# Patient Record
Sex: Female | Born: 1935
Health system: Southern US, Community
[De-identification: ages and names within clinical notes are randomized; demographics above are authoritative.]

## PROBLEM LIST (undated history)

## (undated) DIAGNOSIS — G479 Sleep disorder, unspecified: Secondary | ICD-10-CM

## (undated) DIAGNOSIS — I10 Essential (primary) hypertension: Secondary | ICD-10-CM

## (undated) DIAGNOSIS — R269 Unspecified abnormalities of gait and mobility: Secondary | ICD-10-CM

## (undated) DIAGNOSIS — R202 Paresthesia of skin: Principal | ICD-10-CM

## (undated) DIAGNOSIS — M1990 Unspecified osteoarthritis, unspecified site: Secondary | ICD-10-CM

## (undated) DIAGNOSIS — E7800 Pure hypercholesterolemia, unspecified: Secondary | ICD-10-CM

## (undated) DIAGNOSIS — Z9889 Other specified postprocedural states: Secondary | ICD-10-CM

## (undated) DIAGNOSIS — Z4689 Encounter for fitting and adjustment of other specified devices: Secondary | ICD-10-CM

## (undated) DIAGNOSIS — J3489 Other specified disorders of nose and nasal sinuses: Secondary | ICD-10-CM

## (undated) HISTORY — DX: Pure hypercholesterolemia, unspecified: E78.00

## (undated) HISTORY — DX: Unspecified abnormalities of gait and mobility: R26.9

## (undated) HISTORY — PX: APPENDECTOMY: SHX54

## (undated) HISTORY — DX: Paresthesia of skin: R20.2

## (undated) HISTORY — PX: OTHER SURGICAL HISTORY: SHX169

---

## 1996-11-30 HISTORY — PX: JOINT REPLACEMENT: SHX530

## 2002-02-13 ENCOUNTER — Other Ambulatory Visit: Admission: RE | Admit: 2002-02-13 | Discharge: 2002-02-13 | Payer: Self-pay | Admitting: Obstetrics and Gynecology

## 2002-02-14 ENCOUNTER — Encounter: Payer: Self-pay | Admitting: Obstetrics and Gynecology

## 2002-02-14 ENCOUNTER — Ambulatory Visit (HOSPITAL_COMMUNITY): Admission: RE | Admit: 2002-02-14 | Discharge: 2002-02-14 | Payer: Self-pay | Admitting: Obstetrics and Gynecology

## 2005-04-16 ENCOUNTER — Other Ambulatory Visit: Admission: RE | Admit: 2005-04-16 | Discharge: 2005-04-16 | Payer: Self-pay | Admitting: Obstetrics and Gynecology

## 2007-08-23 ENCOUNTER — Ambulatory Visit (HOSPITAL_COMMUNITY): Admission: RE | Admit: 2007-08-23 | Discharge: 2007-08-23 | Payer: Self-pay | Admitting: Sports Medicine

## 2009-05-23 ENCOUNTER — Ambulatory Visit: Payer: Self-pay | Admitting: Orthopedic Surgery

## 2009-05-23 DIAGNOSIS — IMO0002 Reserved for concepts with insufficient information to code with codable children: Secondary | ICD-10-CM

## 2009-05-23 DIAGNOSIS — Z8679 Personal history of other diseases of the circulatory system: Secondary | ICD-10-CM | POA: Insufficient documentation

## 2009-05-23 DIAGNOSIS — M79609 Pain in unspecified limb: Secondary | ICD-10-CM

## 2009-05-23 DIAGNOSIS — M25569 Pain in unspecified knee: Secondary | ICD-10-CM

## 2009-05-23 DIAGNOSIS — M1710 Unilateral primary osteoarthritis, unspecified knee: Secondary | ICD-10-CM

## 2009-05-23 DIAGNOSIS — M7150 Other bursitis, not elsewhere classified, unspecified site: Secondary | ICD-10-CM

## 2012-02-03 DIAGNOSIS — M76899 Other specified enthesopathies of unspecified lower limb, excluding foot: Secondary | ICD-10-CM | POA: Diagnosis not present

## 2012-02-03 DIAGNOSIS — M169 Osteoarthritis of hip, unspecified: Secondary | ICD-10-CM | POA: Diagnosis not present

## 2012-03-02 ENCOUNTER — Encounter (HOSPITAL_COMMUNITY): Payer: Self-pay | Admitting: Pharmacy Technician

## 2012-03-09 ENCOUNTER — Ambulatory Visit (HOSPITAL_COMMUNITY)
Admission: RE | Admit: 2012-03-09 | Discharge: 2012-03-09 | Disposition: A | Discharge status: Home / Self Care | Payer: Medicare Other | Source: Ambulatory Visit | Attending: Orthopedic Surgery | Admitting: Orthopedic Surgery

## 2012-03-09 ENCOUNTER — Encounter (HOSPITAL_COMMUNITY)
Admission: RE | Admit: 2012-03-09 | Discharge: 2012-03-09 | Disposition: A | Discharge status: Home / Self Care | Payer: Medicare Other | Source: Ambulatory Visit | Attending: Orthopedic Surgery | Admitting: Orthopedic Surgery

## 2012-03-09 ENCOUNTER — Encounter (HOSPITAL_COMMUNITY): Payer: Self-pay

## 2012-03-09 ENCOUNTER — Other Ambulatory Visit (HOSPITAL_COMMUNITY): Payer: Self-pay

## 2012-03-09 DIAGNOSIS — I10 Essential (primary) hypertension: Secondary | ICD-10-CM | POA: Diagnosis not present

## 2012-03-09 DIAGNOSIS — Z01818 Encounter for other preprocedural examination: Secondary | ICD-10-CM | POA: Insufficient documentation

## 2012-03-09 DIAGNOSIS — Z01811 Encounter for preprocedural respiratory examination: Secondary | ICD-10-CM | POA: Diagnosis not present

## 2012-03-09 DIAGNOSIS — Z01812 Encounter for preprocedural laboratory examination: Secondary | ICD-10-CM | POA: Diagnosis not present

## 2012-03-09 DIAGNOSIS — M169 Osteoarthritis of hip, unspecified: Secondary | ICD-10-CM | POA: Diagnosis not present

## 2012-03-09 DIAGNOSIS — M1610 Unilateral primary osteoarthritis, unspecified hip: Secondary | ICD-10-CM | POA: Insufficient documentation

## 2012-03-09 DIAGNOSIS — Z01810 Encounter for preprocedural cardiovascular examination: Secondary | ICD-10-CM | POA: Diagnosis not present

## 2012-03-09 HISTORY — DX: Sleep disorder, unspecified: G47.9

## 2012-03-09 HISTORY — DX: Essential (primary) hypertension: I10

## 2012-03-09 HISTORY — DX: Other specified postprocedural states: Z98.890

## 2012-03-09 HISTORY — DX: Other specified disorders of nose and nasal sinuses: J34.89

## 2012-03-09 LAB — BASIC METABOLIC PANEL
BUN: 18 mg/dL (ref 6–23)
Calcium: 10 mg/dL (ref 8.4–10.5)
Creatinine, Ser: 1.12 mg/dL — ABNORMAL HIGH (ref 0.50–1.10)
GFR calc non Af Amer: 44 mL/min — ABNORMAL LOW (ref >90)
Glucose, Bld: 90 mg/dL (ref 70–99)

## 2012-03-09 LAB — CBC
Hemoglobin: 11.7 g/dL — ABNORMAL LOW (ref 12.0–15.0)
MCH: 30.8 pg (ref 26.0–34.0)
MCHC: 32.5 g/dL (ref 30.0–36.0)
Platelets: 202 10*3/uL (ref 150–400)

## 2012-03-09 LAB — DIFFERENTIAL
Basophils Relative: 1 % (ref 0–1)
Eosinophils Absolute: 0.1 10*3/uL (ref 0.0–0.7)
Monocytes Relative: 8 % (ref 3–12)
Neutrophils Relative %: 68 % (ref 43–77)

## 2012-03-09 LAB — SURGICAL PCR SCREEN: MRSA, PCR: NEGATIVE

## 2012-03-09 LAB — URINE MICROSCOPIC-ADD ON

## 2012-03-09 LAB — URINALYSIS, ROUTINE W REFLEX MICROSCOPIC
Bilirubin Urine: NEGATIVE
Ketones, ur: NEGATIVE mg/dL
Nitrite: NEGATIVE
pH: 6 (ref 5.0–8.0)

## 2012-03-09 LAB — PROTIME-INR: INR: 0.93 (ref 0.00–1.49)

## 2012-03-09 MED ORDER — CHLORHEXIDINE GLUCONATE 4 % EX LIQD
60.0000 mL | Freq: Once | CUTANEOUS | Status: DC
  Filled 2012-03-09: qty 60

## 2012-03-09 NOTE — Pre-Procedure Instructions (Signed)
Message sent to Dr. Charlann Boxer or PA to review abnormal ua and Bmet on Ms Michie (spoke with Candy at office)

## 2012-03-09 NOTE — H&P (Signed)
Carrie Sanders is an 78 y.o. female.    Chief Complaint: left hip OA and pain   HPI: Pt is a 78 y.o. female complaining of left hip pain for 2.5-3 years. Pain had continually increased since the beginning, especially in the last 6 months. X-rays in the clinic show end-stage arthritic changes of the left hip. Pt has tried various conservative treatments which have failed to alleviate their symptoms including NSAIDs. Various options are discussed with the patient. Risks, benefits and expectations were discussed with the patient. Patient understand the risks, benefits and expectations and wishes to proceed with surgery.   PCP:  No primary provider on file.  D/C Plans:  Home with HHPT  Post-op Meds:  Rx given for ASA, Robaxin, Iron, Colace and MiraLax  Tranexamic Acid:   To be given  Decadron:   To be given  PMH: Past Medical History  Diagnosis Date  . Hypertension   . Sinus drainage   . Constipation   . Sleeping difficulty   . H/O left breast biopsy     PSH: Past Surgical History  Procedure Date  . Joint replacement 1998    RT TOTAL HIP  . Cataracts removed   . Appendectomy     AGE 78    Social History:  reports that she quit smoking about 53 years ago. She does not have any smokeless tobacco history on file. She reports that she does not drink alcohol or use illicit drugs.  Allergies:  Allergies  Allergen Reactions  . Codeine Other (See Comments)    Pass out   . Sulfonamide Derivatives Rash    Medications: No current facility-administered medications for this encounter.   Current Outpatient Prescriptions  Medication Sig Dispense Refill  . bumetanide (BUMEX) 1 MG tablet Take 1 mg by mouth daily after breakfast.      . Calcium Carbonate-Vitamin D (CALCIUM + D PO) Take 1 tablet by mouth daily.      Marland Kitchen ibuprofen (ADVIL,MOTRIN) 200 MG tablet Take 400 mg by mouth every 6 (six) hours as needed. Pain      . losartan (COZAAR) 100 MG tablet Take 50 mg by mouth 2  (two) times daily.      . naproxen sodium (ANAPROX) 220 MG tablet Take 220 mg by mouth 2 (two) times daily as needed. Pain      . rosuvastatin (CRESTOR) 20 MG tablet Take 20 mg by mouth daily after breakfast.      . traMADol (ULTRAM) 50 MG tablet Take 50 mg by mouth every 6 (six) hours as needed. Pain         Facility-Administered Medications Ordered in Other Encounters  Medication Dose Route Frequency Provider Last Rate Last Dose  . chlorhexidine (HIBICLENS) 4 % liquid 4 application  60 mL Topical Once Genelle Gather [place], [place]        Results for orders placed during the hospital encounter of 03/09/12 (from the past 48 hour(s))  SURGICAL PCR SCREEN     Status: Abnormal   Collection Time   03/09/12 11:06 AM      Component Value Range Comment   MRSA, PCR NEGATIVE  NEGATIVE     Staphylococcus aureus POSITIVE (*) NEGATIVE    URINALYSIS, ROUTINE W REFLEX MICROSCOPIC     Status: Abnormal   Collection Time   03/09/12 11:07 AM      Component Value Range Comment   Color, Urine YELLOW  YELLOW     APPearance CLEAR  CLEAR  Specific Gravity, Urine 1.019  1.005 - 1.030     pH 6.0  5.0 - 8.0     Glucose, UA NEGATIVE  NEGATIVE (mg/dL)    Hgb urine dipstick SMALL (*) NEGATIVE     Bilirubin Urine NEGATIVE  NEGATIVE     Ketones, ur NEGATIVE  NEGATIVE (mg/dL)    Protein, ur NEGATIVE  NEGATIVE (mg/dL)    Urobilinogen, UA 0.2  0.0 - 1.0 (mg/dL)    Nitrite NEGATIVE  NEGATIVE     Leukocytes, UA MODERATE (*) NEGATIVE    URINE MICROSCOPIC-ADD ON     Status: Abnormal   Collection Time   03/09/12 11:07 AM      Component Value Range Comment   Squamous Epithelial / LPF FEW (*) RARE     WBC, UA 7-10  <3 (WBC/hpf)    RBC / HPF 3-6  <3 (RBC/hpf)    Bacteria, UA RARE  RARE     Casts HYALINE CASTS (*) NEGATIVE    APTT     Status: Normal   Collection Time   03/09/12 11:15 AM      Component Value Range Comment   aPTT 28  24 - 37 (seconds)   BASIC METABOLIC PANEL     Status: Abnormal   Collection Time    03/09/12 11:15 AM      Component Value Range Comment   Sodium 140  135 - 145 (mEq/L)    Potassium 4.1  3.5 - 5.1 (mEq/L)    Chloride 103  96 - 112 (mEq/L)    CO2 29  19 - 32 (mEq/L)    Glucose, Bld 90  70 - 99 (mg/dL)    BUN 18  6 - 23 (mg/dL)    Creatinine, Ser 1.61 (*) 0.50 - 1.10 (mg/dL)    Calcium 09.6  8.4 - 10.5 (mg/dL)    GFR calc non Af Amer 44 (*) >90 (mL/min)    GFR calc Af Amer 52 (*) >90 (mL/min)   CBC     Status: Abnormal   Collection Time   03/09/12 11:15 AM      Component Value Range Comment   WBC 5.0  4.0 - 10.5 (K/uL)    RBC 3.80 (*) 3.87 - 5.11 (MIL/uL)    Hemoglobin 11.7 (*) 12.0 - 15.0 (g/dL)    HCT 04.5  40.9 - 81.1 (%)    MCV 94.7  78.0 - 100.0 (fL)    MCH 30.8  26.0 - 34.0 (pg)    MCHC 32.5  30.0 - 36.0 (g/dL)    RDW 91.4  78.2 - 95.6 (%)    Platelets 202  150 - 400 (K/uL)   DIFFERENTIAL     Status: Normal   Collection Time   03/09/12 11:15 AM      Component Value Range Comment   Neutrophils Relative 68  43 - 77 (%)    Neutro Abs 3.4  1.7 - 7.7 (K/uL)    Lymphocytes Relative 22  12 - 46 (%)    Lymphs Abs 1.1  0.7 - 4.0 (K/uL)    Monocytes Relative 8  3 - 12 (%)    Monocytes Absolute 0.4  0.1 - 1.0 (K/uL)    Eosinophils Relative 2  0 - 5 (%)    Eosinophils Absolute 0.1  0.0 - 0.7 (K/uL)    Basophils Relative 1  0 - 1 (%)    Basophils Absolute 0.0  0.0 - 0.1 (K/uL)   PROTIME-INR     Status: Normal  Collection Time   03/09/12 11:15 AM      Component Value Range Comment   Prothrombin Time 12.7  11.6 - 15.2 (seconds)    INR 0.93  0.00 - 1.49     Dg Chest 2 View  03/09/2012  *RADIOLOGY REPORT*  Clinical Data: Preop  CHEST - 2 VIEW  Comparison: None.  Findings: Cardiomediastinal silhouette is unremarkable.  Mild hyperinflation.  No acute infiltrate or pleural effusion.  Mild degenerative changes thoracic spine. Atherosclerotic calcifications are noted thoracic and abdominal aorta.  IMPRESSION: No active disease.  Mild hyperinflation.  Original Report  Authenticated By: Natasha Mead, M.D.    ROS: Review of Systems  Constitutional: Negative.   HENT: Negative.   Eyes: Negative.   Respiratory: Negative.   Cardiovascular: Negative.   Gastrointestinal: Positive for constipation.  Genitourinary: Negative.   Musculoskeletal: Positive for back pain and joint pain.  Skin: Negative.   Neurological: Negative.   Endo/Heme/Allergies: Negative.   Psychiatric/Behavioral: Negative.      Physical Exam: BP: 142/77 ; Pulse: 100 ; Resp: 16 ; Physical Exam  Constitutional: She is well-developed, well-nourished, and in no distress.  HENT:  Head: Normocephalic and atraumatic.  Nose: Nose normal.  Mouth/Throat: Oropharynx is clear and moist.  Eyes: Pupils are equal, round, and reactive to light.  Neck: Neck supple. No JVD present. Decreased range of motion present. No tracheal deviation present. No thyromegaly present.  Cardiovascular: Normal rate, regular rhythm, normal heart sounds and intact distal pulses.  Exam reveals no gallop and no friction rub.   No murmur heard. Pulmonary/Chest: Effort normal and breath sounds normal. No stridor. No respiratory distress.  Abdominal: Soft. There is no tenderness. There is no guarding.  Musculoskeletal:       Left hip: She exhibits decreased range of motion, decreased strength, tenderness and bony tenderness. She exhibits no swelling, no crepitus, no deformity and no laceration.     Assessment/Plan Assessment: left hip OA and pain   Plan: Patient will undergo a left total hip arthroplasty, anterior approach on 03/17/2012 at [facility] per Dr. Charlann Boxer. Risks benefits and expectation were discussed with the patient. Patient understand risks, benefits and expectation and wishes to proceed.   Anastasio Auerbach Josie Burleigh   PAC  03/09/2012, 3:05 PM

## 2012-03-09 NOTE — Patient Instructions (Signed)
22 Carrie Sanders  03/09/2012   Your procedure is scheduled on:  03/17/12  Report to SHORT STAY DEPT  at 5:15 AM.  Call this number if you have problems the morning of surgery: [phone]   Remember:   Do not eat food or drink liquids AFTER MIDNIGHT  .  Take these medicines the morning of surgery with A SIP OF WATER: CRESTOR / ULTRAM IF NEEDED   Do not wear jewelry, make-up or nail polish.  Do not wear lotions, powders, or perfumes.   Do not shave legs or underarms 48 hrs. before surgery (men may shave face)  Do not bring valuables to the hospital.  Contacts, dentures or bridgework may not be worn into surgery.  Leave suitcase in the car. After surgery it may be brought to your room.  For patients admitted to the hospital, checkout time is 11:00 AM the day of discharge.   Patients discharged the day of surgery will not be allowed to drive home.  Name and phone number of your driver:   Special Instructions:   Please read over the following fact sheets that you were given: MRSA  Information /INCENTIVE  SPIROMETER               SHOWER WITH BETASEPT THE NIGHT BEFORE SURGERY AND THE MORNING OF SURGERY

## 2012-03-17 ENCOUNTER — Encounter (HOSPITAL_COMMUNITY): Payer: Self-pay | Admitting: Anesthesiology

## 2012-03-17 ENCOUNTER — Ambulatory Visit (HOSPITAL_COMMUNITY): Payer: Medicare Other

## 2012-03-17 ENCOUNTER — Ambulatory Visit (HOSPITAL_COMMUNITY): Payer: Medicare Other | Admitting: Anesthesiology

## 2012-03-17 ENCOUNTER — Encounter (HOSPITAL_COMMUNITY)
Admission: RE | Disposition: A | Discharge status: Home Health | Payer: Self-pay | Source: Ambulatory Visit | Attending: Orthopedic Surgery

## 2012-03-17 ENCOUNTER — Inpatient Hospital Stay (HOSPITAL_COMMUNITY)
Admission: RE | Admit: 2012-03-17 | Discharge: 2012-03-19 | DRG: 470 | Disposition: A | Discharge status: Home Health | Payer: Medicare Other | Source: Ambulatory Visit | Attending: Orthopedic Surgery | Admitting: Orthopedic Surgery

## 2012-03-17 ENCOUNTER — Encounter (HOSPITAL_COMMUNITY): Payer: Self-pay | Admitting: *Deleted

## 2012-03-17 DIAGNOSIS — I10 Essential (primary) hypertension: Secondary | ICD-10-CM | POA: Diagnosis not present

## 2012-03-17 DIAGNOSIS — D649 Anemia, unspecified: Secondary | ICD-10-CM | POA: Diagnosis not present

## 2012-03-17 DIAGNOSIS — Z471 Aftercare following joint replacement surgery: Secondary | ICD-10-CM | POA: Diagnosis not present

## 2012-03-17 DIAGNOSIS — M169 Osteoarthritis of hip, unspecified: Principal | ICD-10-CM | POA: Diagnosis present

## 2012-03-17 DIAGNOSIS — M1610 Unilateral primary osteoarthritis, unspecified hip: Secondary | ICD-10-CM | POA: Diagnosis not present

## 2012-03-17 DIAGNOSIS — Z96649 Presence of unspecified artificial hip joint: Secondary | ICD-10-CM

## 2012-03-17 DIAGNOSIS — M25559 Pain in unspecified hip: Secondary | ICD-10-CM | POA: Diagnosis not present

## 2012-03-17 HISTORY — PX: TOTAL HIP ARTHROPLASTY: SHX124

## 2012-03-17 LAB — ABO/RH: ABO/RH(D): A POS

## 2012-03-17 LAB — TYPE AND SCREEN: Antibody Screen: NEGATIVE

## 2012-03-17 SURGERY — ARTHROPLASTY, HIP, TOTAL, ANTERIOR APPROACH
Anesthesia: Spinal | Site: Hip | Laterality: Left | Wound class: Clean

## 2012-03-17 MED ORDER — DIPHENHYDRAMINE HCL 25 MG PO CAPS: 25.0000 mg | ORAL_CAPSULE | Freq: Four times a day (QID) | ORAL | Status: DC | PRN

## 2012-03-17 MED ORDER — FLEET ENEMA 7-19 GM/118ML RE ENEM: 1.0000 | ENEMA | Freq: Once | RECTAL | Status: AC | PRN

## 2012-03-17 MED ORDER — LOSARTAN POTASSIUM 50 MG PO TABS
50.0000 mg | ORAL_TABLET | Freq: Two times a day (BID) | ORAL | Status: DC
  Administered 2012-03-17 – 2012-03-19 (×4): 50 mg via ORAL
  Filled 2012-03-17 (×6): qty 1

## 2012-03-17 MED ORDER — SODIUM CHLORIDE 0.9 % IV SOLN
100.0000 mL/h | INTRAVENOUS | Status: DC
  Administered 2012-03-17 – 2012-03-18 (×3): 100 mL/h via INTRAVENOUS
  Filled 2012-03-17 (×12): qty 1000

## 2012-03-17 MED ORDER — 0.9 % SODIUM CHLORIDE (POUR BTL) OPTIME
TOPICAL | Status: DC | PRN
  Administered 2012-03-17: 1000 mL

## 2012-03-17 MED ORDER — PHENOL 1.4 % MT LIQD: 1.0000 | OROMUCOSAL | Status: DC | PRN

## 2012-03-17 MED ORDER — ONDANSETRON HCL 4 MG PO TABS: 4.0000 mg | ORAL_TABLET | Freq: Four times a day (QID) | ORAL | Status: DC | PRN

## 2012-03-17 MED ORDER — PROPOFOL 10 MG/ML IV EMUL
INTRAVENOUS | Status: DC | PRN
  Administered 2012-03-17: 140 ug/kg/min via INTRAVENOUS

## 2012-03-17 MED ORDER — METHOCARBAMOL 100 MG/ML IJ SOLN
500.0000 mg | Freq: Four times a day (QID) | INTRAVENOUS | Status: DC | PRN
  Administered 2012-03-17: 500 mg via INTRAVENOUS
  Filled 2012-03-17: qty 5

## 2012-03-17 MED ORDER — EPHEDRINE SULFATE 50 MG/ML IJ SOLN
INTRAMUSCULAR | Status: DC | PRN
  Administered 2012-03-17: 10 mg via INTRAVENOUS

## 2012-03-17 MED ORDER — ZOLPIDEM TARTRATE 5 MG PO TABS: 5.0000 mg | ORAL_TABLET | Freq: Every evening | ORAL | Status: DC | PRN

## 2012-03-17 MED ORDER — ACETAMINOPHEN 10 MG/ML IV SOLN
INTRAVENOUS | Status: AC
  Filled 2012-03-17: qty 100

## 2012-03-17 MED ORDER — BUMETANIDE 1 MG PO TABS
1.0000 mg | ORAL_TABLET | Freq: Every day | ORAL | Status: DC
  Administered 2012-03-18: 1 mg via ORAL
  Filled 2012-03-17 (×2): qty 1

## 2012-03-17 MED ORDER — ONDANSETRON HCL 4 MG/2ML IJ SOLN
INTRAMUSCULAR | Status: DC | PRN
  Administered 2012-03-17: 4 mg via INTRAVENOUS

## 2012-03-17 MED ORDER — ACETAMINOPHEN 650 MG RE SUPP: 650.0000 mg | Freq: Four times a day (QID) | RECTAL | Status: DC | PRN

## 2012-03-17 MED ORDER — CEFAZOLIN SODIUM 1-5 GM-% IV SOLN
1.0000 g | Freq: Four times a day (QID) | INTRAVENOUS | Status: AC
  Administered 2012-03-17 – 2012-03-18 (×3): 1 g via INTRAVENOUS
  Filled 2012-03-17 (×3): qty 50

## 2012-03-17 MED ORDER — TRANEXAMIC ACID 100 MG/ML IV SOLN
15.0000 mg/kg | Freq: Once | INTRAVENOUS | Status: AC
  Administered 2012-03-17: 843 mg via INTRAVENOUS
  Filled 2012-03-17: qty 8.43

## 2012-03-17 MED ORDER — METOCLOPRAMIDE HCL 10 MG PO TABS: 5.0000 mg | ORAL_TABLET | Freq: Three times a day (TID) | ORAL | Status: DC | PRN

## 2012-03-17 MED ORDER — DEXAMETHASONE SODIUM PHOSPHATE 10 MG/ML IJ SOLN
10.0000 mg | Freq: Once | INTRAMUSCULAR | Status: AC
  Administered 2012-03-17: 6 mg via INTRAVENOUS

## 2012-03-17 MED ORDER — HYDROCODONE-ACETAMINOPHEN 5-325 MG PO TABS
1.0000 | ORAL_TABLET | ORAL | Status: DC | PRN
  Administered 2012-03-17 (×3): 2 via ORAL
  Administered 2012-03-18 (×3): 1 via ORAL
  Administered 2012-03-18: 2 via ORAL
  Administered 2012-03-19: 1 via ORAL
  Filled 2012-03-17: qty 2
  Filled 2012-03-17: qty 1
  Filled 2012-03-17 (×2): qty 2
  Filled 2012-03-17: qty 1
  Filled 2012-03-17: qty 2
  Filled 2012-03-17 (×2): qty 1

## 2012-03-17 MED ORDER — ACETAMINOPHEN 10 MG/ML IV SOLN
INTRAVENOUS | Status: DC | PRN
  Administered 2012-03-17: 1000 mg via INTRAVENOUS

## 2012-03-17 MED ORDER — MENTHOL 3 MG MT LOZG: 1.0000 | LOZENGE | OROMUCOSAL | Status: DC | PRN

## 2012-03-17 MED ORDER — METHOCARBAMOL 500 MG PO TABS
500.0000 mg | ORAL_TABLET | Freq: Four times a day (QID) | ORAL | Status: DC | PRN
  Administered 2012-03-17 – 2012-03-18 (×2): 500 mg via ORAL
  Filled 2012-03-17 (×2): qty 1

## 2012-03-17 MED ORDER — POLYETHYLENE GLYCOL 3350 17 G PO PACK
17.0000 g | PACK | Freq: Two times a day (BID) | ORAL | Status: DC
  Administered 2012-03-17 – 2012-03-18 (×2): 17 g via ORAL
  Filled 2012-03-17 (×6): qty 1

## 2012-03-17 MED ORDER — ACETAMINOPHEN 325 MG PO TABS: 650.0000 mg | ORAL_TABLET | Freq: Four times a day (QID) | ORAL | Status: DC | PRN

## 2012-03-17 MED ORDER — METOCLOPRAMIDE HCL 5 MG/ML IJ SOLN: 5.0000 mg | Freq: Three times a day (TID) | INTRAMUSCULAR | Status: DC | PRN

## 2012-03-17 MED ORDER — ONDANSETRON HCL 4 MG/2ML IJ SOLN: 4.0000 mg | Freq: Four times a day (QID) | INTRAMUSCULAR | Status: DC | PRN

## 2012-03-17 MED ORDER — ALUM & MAG HYDROXIDE-SIMETH 200-200-20 MG/5ML PO SUSP: 30.0000 mL | ORAL | Status: DC | PRN

## 2012-03-17 MED ORDER — BISACODYL 5 MG PO TBEC: 5.0000 mg | DELAYED_RELEASE_TABLET | Freq: Every day | ORAL | Status: DC | PRN

## 2012-03-17 MED ORDER — LACTATED RINGERS IV SOLN: INTRAVENOUS | Status: DC

## 2012-03-17 MED ORDER — DEXAMETHASONE SODIUM PHOSPHATE 10 MG/ML IJ SOLN
10.0000 mg | Freq: Once | INTRAMUSCULAR | Status: AC
  Administered 2012-03-18: 10 mg via INTRAVENOUS
  Filled 2012-03-17: qty 1

## 2012-03-17 MED ORDER — CEFAZOLIN SODIUM 1-5 GM-% IV SOLN
1.0000 g | INTRAVENOUS | Status: AC
  Administered 2012-03-17: 1 g via INTRAVENOUS

## 2012-03-17 MED ORDER — ATORVASTATIN CALCIUM 40 MG PO TABS
40.0000 mg | ORAL_TABLET | Freq: Every day | ORAL | Status: DC
  Administered 2012-03-18: 40 mg via ORAL
  Filled 2012-03-17 (×2): qty 1

## 2012-03-17 MED ORDER — RIVAROXABAN 10 MG PO TABS
10.0000 mg | ORAL_TABLET | ORAL | Status: DC
  Administered 2012-03-18 – 2012-03-19 (×2): 10 mg via ORAL
  Filled 2012-03-17 (×2): qty 1

## 2012-03-17 MED ORDER — LACTATED RINGERS IV SOLN
INTRAVENOUS | Status: DC | PRN
  Administered 2012-03-17 (×2): via INTRAVENOUS

## 2012-03-17 MED ORDER — HYDROMORPHONE HCL PF 1 MG/ML IJ SOLN: 0.2500 mg | INTRAMUSCULAR | Status: DC | PRN

## 2012-03-17 MED ORDER — MIDAZOLAM HCL 5 MG/5ML IJ SOLN
INTRAMUSCULAR | Status: DC | PRN
  Administered 2012-03-17: 1 mg via INTRAVENOUS

## 2012-03-17 MED ORDER — FERROUS SULFATE 325 (65 FE) MG PO TABS
325.0000 mg | ORAL_TABLET | Freq: Three times a day (TID) | ORAL | Status: DC
  Administered 2012-03-17 – 2012-03-19 (×5): 325 mg via ORAL
  Filled 2012-03-17 (×7): qty 1

## 2012-03-17 MED ORDER — HYDROMORPHONE HCL PF 1 MG/ML IJ SOLN: 0.5000 mg | INTRAMUSCULAR | Status: DC | PRN

## 2012-03-17 MED ORDER — CEFAZOLIN SODIUM 1-5 GM-% IV SOLN
INTRAVENOUS | Status: AC
  Filled 2012-03-17: qty 50

## 2012-03-17 MED ORDER — BUPIVACAINE IN DEXTROSE 0.75-8.25 % IT SOLN
INTRATHECAL | Status: DC | PRN
  Administered 2012-03-17: 10 mg via INTRATHECAL

## 2012-03-17 MED ORDER — PROPOFOL 10 MG/ML IV BOLUS
INTRAVENOUS | Status: DC | PRN
  Administered 2012-03-17: 10 mg via INTRAVENOUS
  Administered 2012-03-17: 30 mg via INTRAVENOUS

## 2012-03-17 MED ORDER — FENTANYL CITRATE 0.05 MG/ML IJ SOLN
INTRAMUSCULAR | Status: DC | PRN
  Administered 2012-03-17 (×4): 25 ug via INTRAVENOUS

## 2012-03-17 MED ORDER — DOCUSATE SODIUM 100 MG PO CAPS
100.0000 mg | ORAL_CAPSULE | Freq: Two times a day (BID) | ORAL | Status: DC
  Administered 2012-03-17 – 2012-03-19 (×5): 100 mg via ORAL
  Filled 2012-03-17 (×6): qty 1

## 2012-03-17 SURGICAL SUPPLY — 43 items
ADH SKN CLS APL DERMABOND .7 (GAUZE/BANDAGES/DRESSINGS) ×1
BAG SPEC THK2 15X12 ZIP CLS (MISCELLANEOUS) ×2
BAG ZIPLOCK 12X15 (MISCELLANEOUS) ×4 IMPLANT
BLADE SAW SGTL 18X1.27X75 (BLADE) ×2 IMPLANT
CELLS DAT CNTRL 66122 CELL SVR (MISCELLANEOUS) ×1 IMPLANT
CLOTH BEACON ORANGE TIMEOUT ST (SAFETY) ×2 IMPLANT
DERMABOND ADVANCED (GAUZE/BANDAGES/DRESSINGS) ×1
DERMABOND ADVANCED .7 DNX12 (GAUZE/BANDAGES/DRESSINGS) ×1 IMPLANT
DRAPE C-ARM 42X72 X-RAY (DRAPES) ×2 IMPLANT
DRAPE STERI IOBAN 125X83 (DRAPES) ×2 IMPLANT
DRAPE U-SHAPE 47X51 STRL (DRAPES) ×6 IMPLANT
DRSG AQUACEL AG ADV 3.5X10 (GAUZE/BANDAGES/DRESSINGS) ×2 IMPLANT
DRSG TEGADERM 2-3/8X2-3/4 SM (GAUZE/BANDAGES/DRESSINGS) ×1 IMPLANT
DRSG TEGADERM 4X4.75 (GAUZE/BANDAGES/DRESSINGS) IMPLANT
DURAPREP 26ML APPLICATOR (WOUND CARE) ×2 IMPLANT
ELECT BLADE TIP CTD 4 INCH (ELECTRODE) ×2 IMPLANT
ELECT REM PT RETURN 9FT ADLT (ELECTROSURGICAL) ×2
ELECTRODE REM PT RTRN 9FT ADLT (ELECTROSURGICAL) ×1 IMPLANT
EVACUATOR 1/8 PVC DRAIN (DRAIN) ×1 IMPLANT
FACESHIELD LNG OPTICON STERILE (SAFETY) ×8 IMPLANT
GAUZE SPONGE 2X2 8PLY STRL LF (GAUZE/BANDAGES/DRESSINGS) ×1 IMPLANT
GLOVE BIOGEL PI IND STRL 7.5 (GLOVE) ×1 IMPLANT
GLOVE BIOGEL PI IND STRL 8 (GLOVE) ×1 IMPLANT
GLOVE BIOGEL PI INDICATOR 7.5 (GLOVE) ×1
GLOVE BIOGEL PI INDICATOR 8 (GLOVE) ×1
GLOVE ECLIPSE 8.0 STRL XLNG CF (GLOVE) ×2 IMPLANT
GLOVE ORTHO TXT STRL SZ7.5 (GLOVE) ×4 IMPLANT
GOWN BRE IMP PREV XXLGXLNG (GOWN DISPOSABLE) ×4 IMPLANT
GOWN STRL NON-REIN LRG LVL3 (GOWN DISPOSABLE) ×3 IMPLANT
KIT BASIN OR (CUSTOM PROCEDURE TRAY) ×2 IMPLANT
PACK TOTAL JOINT (CUSTOM PROCEDURE TRAY) ×2 IMPLANT
PADDING CAST COTTON 6X4 STRL (CAST SUPPLIES) ×2 IMPLANT
RETRACTOR WND ALEXIS 18 MED (MISCELLANEOUS) ×1 IMPLANT
RTRCTR WOUND ALEXIS 18CM MED (MISCELLANEOUS) ×2
SPONGE GAUZE 2X2 STER 10/PKG (GAUZE/BANDAGES/DRESSINGS) ×1
SUCTION FRAZIER 12FR DISP (SUCTIONS) ×2 IMPLANT
SUT MNCRL AB 4-0 PS2 18 (SUTURE) ×2 IMPLANT
SUT VIC AB 1 CT1 36 (SUTURE) ×8 IMPLANT
SUT VIC AB 2-0 CT1 27 (SUTURE) ×4
SUT VIC AB 2-0 CT1 TAPERPNT 27 (SUTURE) ×2 IMPLANT
SUT VLOC 180 0 24IN GS25 (SUTURE) ×1 IMPLANT
TOWEL OR 17X26 10 PK STRL BLUE (TOWEL DISPOSABLE) ×4 IMPLANT
TRAY FOLEY CATH 14FRSI W/METER (CATHETERS) ×2 IMPLANT

## 2012-03-17 NOTE — Preoperative (Signed)
Beta Blockers   Reason not to administer Beta Blockers:Not Applicable 

## 2012-03-17 NOTE — Anesthesia Postprocedure Evaluation (Signed)
  Anesthesia Post-op Note  Patient: Carrie Sanders  Procedure(s) Performed: Procedure(s) (LRB): TOTAL HIP ARTHROPLASTY ANTERIOR APPROACH (Left)  Patient Location: PACU  Anesthesia Type: Spinal  Level of Consciousness: oriented and sedated  Airway and Oxygen Therapy: Patient Spontanous Breathing and Patient connected to nasal cannula oxygen  Post-op Pain: mild  Post-op Assessment: Post-op Vital signs reviewed, Patient's Cardiovascular Status Stable, Respiratory Function Stable and Patent Airway  Post-op Vital Signs: stable  Complications: No apparent anesthesia complications

## 2012-03-17 NOTE — Op Note (Signed)
NAME:  Kaydin Labo                ACCOUNT NO.: [id number]      MEDICAL RECORD NO.: [id number]      FACILITY:  [facility]      PHYSICIAN:  Durene Romans D  DATE OF BIRTH:  27-Mar-1927     DATE OF PROCEDURE:  03/17/2012                                 OPERATIVE REPORT         PREOPERATIVE DIAGNOSIS: Left  hip osteoarthritis.      POSTOPERATIVE DIAGNOSIS:  Left osteoarthritis.      PROCEDURE:  Left total hip replacement through an anterior approach   utilizing DePuy THR system, component size 48mm pinnacle cup, a size 32+4 neutral   Altrex liner, a size 3Hi Tri Lock stem with a 32+1 aSphere metal ball.      SURGEON:  Madlyn Frankel. Charlann Boxer, M.D.      ASSISTANT:  Lanney Gins, PA      ANESTHESIA:  Spinal.      SPECIMENS:  None.      COMPLICATIONS:  None.      BLOOD LOSS:  150 cc     DRAINS:  One Hemovac.      INDICATION OF THE PROCEDURE:  Willena Jeancharles is a 78 y.o. female who had   presented to office for evaluation of left hip pain.  Radiographs revealed   progressive degenerative changes with bone-on-bone   articulation to the  hip joint.  The patient had painful limited range of   motion significantly affecting their overall quality of life.  The patient was failing to    respond to conservative measures, and at this point was ready   to proceed with more definitive measures.  The patient has noted progressive   degenerative changes in his hip, progressive problems and dysfunction   with regarding the hip prior to surgery.  Consent was obtained for   benefit of pain relief.  Specific risk of infection, DVT, component   failure, dislocation, need for revision surgery, as well discussion of   the anterior versus posterior approach were reviewed.  Consent was   obtained for benefit of anterior pain relief through an anterior   approach.      PROCEDURE IN DETAIL:  The patient was brought to operative theater.   Once adequate anesthesia, preoperative antibiotics,  1gm Ancef administered.   The patient was positioned supine on the OSI Hanna table.  Once adequate   padding of boney process was carried out, we had predraped out the hip, and  used fluoroscopy to confirm orientation of the pelvis and position.      The left hip was then prepped and draped from proximal iliac crest to   mid thigh with shower curtain technique.      Time-out was performed identifying the patient, planned procedure, and   extremity.     An incision was then made 2 cm distal and lateral to the   anterior superior iliac spine extending over the orientation of the   tensor fascia lata muscle and sharp dissection was carried down to the   fascia of the muscle and protractor placed in the soft tissues.      The fascia was then incised.  The muscle belly was identified and swept   laterally and retractor placed  along the superior neck.  Following   cauterization of the circumflex vessels and removing some pericapsular   fat, a second cobra retractor was placed on the inferior neck.  A third   retractor was placed on the anterior acetabulum after elevating the   anterior rectus.  A L-capsulotomy was along the line of the   superior neck to the trochanteric fossa, then extended proximally and   distally.  Tag sutures were placed and the retractors were then placed   intracapsular.  We then identified the trochanteric fossa and   orientation of my neck cut, confirmed this radiographically   and then made a neck osteotomy with the femur on traction.  The femoral   head was removed without difficulty or complication.  Traction was let   off and retractors were placed posterior and anterior around the   acetabulum.      The labrum and foveal tissue were debrided.  I began reaming with a 43mm   reamer and reamed up to 47mm reamer with good bony bed preparation and a 48mm   cup was chosen.  The final 48mm Pinnacle cup was then impacted under fluoroscopy  to confirm the depth of  penetration and orientation with respect to   abduction.  A screw was placed followed by the hole eliminator.  The final   32+4 Altrex liner was impacted with good visualized rim fit.  The cup was positioned anatomically within the acetabular portion of the pelvis.      At this point, the femur was rolled at 80 degrees.  Further capsule was   released off the inferior aspect of the femoral neck.  I then   released the superior capsule proximally.  The hook was placed laterally   along the femur and elevated manually and held in position with the bed   hook.  The leg was then extended and adducted with the leg rolled to 100   degrees of external rotation.  Once the proximal femur was fully   exposed, I used a box osteotome to set orientation.  I then began   broaching with the starting chili pepper broach and passed this by hand and then broached up to 3.  With the 3 broach in place I chose a high offset neck, a 32+1 ball and did a trial reduction.  The offset was appropriate, leg lengths   appeared to be equal, confirmed radiographically. She had a preoperative leg length discrepancy with history of previous right THR  Given these findings, I went ahead and dislocated the hip, repositioned all   retractors and positioned the right hip in the extended and abducted position.  The final 3 Hi  Tri Lock stem was   chosen and it was impacted down to the level of neck cut.  Based on this   and the trial reduction, a 32+1 aSphere metal ball was chosen and   impacted onto a clean and dry trunnion, and the hip was reduced.  The   hip had been irrigated throughout the case again at this point.  I did   reapproximate the superior capsular leaflet to the anterior leaflet   using #1 Vicryl, placed a medium Hemovac drain deep.  The fascia of the   tensor fascia lata muscle was then reapproximated using #1 Vicryl.  The   remaining wound was closed with 2-0 Vicryl and running 4-0 Monocryl.   The hip was  cleaned, dried, and dressed sterilely using Dermabond and  Aquacel dressing.  Drain site dressed separately.  She was then brought   to recovery room in stable condition tolerating the procedure well.    Lanney Gins, PA-C was present for the entirety of the case involved from   preoperative positioning, perioperative retractor management, general   facilitation of the case, as well as primary wound closure as assistant.            Madlyn Frankel Charlann Boxer, M.D.            MDO/MEDQ  D:  09/22/2011  T:  09/22/2011  Job:  [id number]      Electronically Signed by Durene Romans M.D. on 09/28/2011 09:15:38 AM

## 2012-03-17 NOTE — Anesthesia Procedure Notes (Signed)
Spinal  Patient location during procedure: OR Start time: 03/17/2012 7:49 AM End time: 03/17/2012 7:49 AM Staffing Anesthesiologist: Lestine Box B Performed by: anesthesiologist  Preanesthetic Checklist Completed: patient identified, site marked, surgical consent, pre-op evaluation, timeout performed, IV checked, risks and benefits discussed and monitors and equipment checked Spinal Block Patient position: sitting Prep: Betadine and site prepped and draped Patient monitoring: heart rate, cardiac monitor, continuous pulse ox and blood pressure Approach: right paramedian Location: L3-4 Injection technique: single-shot Needle Needle type: Quincke  Needle gauge: 25 G Needle length: 10 cm Needle insertion depth: 3 cm Assessment Sensory level: T6 Additional Notes [id number], 2014-09

## 2012-03-17 NOTE — Progress Notes (Signed)
Pt states she took Cipro x 5 days for UTI

## 2012-03-17 NOTE — Interval H&P Note (Signed)
History and Physical Interval Note:  03/17/2012 7:32 AM  Carrie Sanders  has presented today for surgery, with the diagnosis of Osteoarthritis of the Left hip  The various methods of treatment have been discussed with the patient and family. After consideration of risks, benefits and other options for treatment, the patient has consented to  Procedure(s) (LRB): LEFT TOTAL HIP ARTHROPLASTY ANTERIOR APPROACH (Left) as a surgical intervention .  The patients' history has been reviewed, patient examined, no change in status, stable for surgery.  I have reviewed the patients' chart and labs.  Questions were answered to the patient's satisfaction.     Shelda Pal

## 2012-03-17 NOTE — Transfer of Care (Signed)
Immediate Anesthesia Transfer of Care Note  Patient: Carrie Sanders  Procedure(s) Performed: Procedure(s) (LRB): TOTAL HIP ARTHROPLASTY ANTERIOR APPROACH (Left)  Patient Location: PACU  Anesthesia Type: Spinal  Level of Consciousness: awake, alert  and oriented  Airway & Oxygen Therapy: Patient Spontanous Breathing and Patient connected to face mask oxygen  Post-op Assessment: Report given to PACU RN and Post -op Vital signs reviewed and stable  Post vital signs: Reviewed and stable  Complications: No apparent anesthesia complications

## 2012-03-17 NOTE — Anesthesia Preprocedure Evaluation (Signed)
Anesthesia Evaluation  Patient identified by MRN, date of birth, ID band Patient awake  General Assessment Comment:Advanced yrs  Reviewed: Allergy & Precautions, H&P , NPO status , Patient's Chart, lab work & pertinent test results, reviewed documented beta blocker date and time   Airway Mallampati: II TM Distance: >3 FB     Dental  (+) Teeth Intact and Dental Advisory Given   Pulmonary neg pulmonary ROS,  breath sounds clear to auscultation        Cardiovascular hypertension, Pt. on medications Rhythm:Regular Rate:Normal  Denies cardiac symptoms   Neuro/Psych negative neurological ROS  negative psych ROS   GI/Hepatic negative GI ROS, Neg liver ROS,   Endo/Other  negative endocrine ROS  Renal/GU negative Renal ROS  negative genitourinary   Musculoskeletal   Abdominal   Peds negative pediatric ROS (+)  Hematology Anemia, Hgb 11.7   Anesthesia Other Findings   Reproductive/Obstetrics negative OB ROS                           Anesthesia Physical Anesthesia Plan  ASA: II  Anesthesia Plan: Spinal   Post-op Pain Management:    Induction: Intravenous  Airway Management Planned: Mask  Additional Equipment:   Intra-op Plan:   Post-operative Plan:   Informed Consent: I have reviewed the patients History and Physical, chart, labs and discussed the procedure including the risks, benefits and alternatives for the proposed anesthesia with the patient or authorized representative who has indicated his/her understanding and acceptance.   Dental advisory given  Plan Discussed with: CRNA and Surgeon  Anesthesia Plan Comments:         Anesthesia Quick Evaluation

## 2012-03-18 LAB — CBC
HCT: 25.5 % — ABNORMAL LOW (ref 36.0–46.0)
MCH: 31.7 pg (ref 26.0–34.0)
MCV: 95.1 fL (ref 78.0–100.0)
Platelets: 132 10*3/uL — ABNORMAL LOW (ref 150–400)
RBC: 2.68 MIL/uL — ABNORMAL LOW (ref 3.87–5.11)
RDW: 14 % (ref 11.5–15.5)

## 2012-03-18 LAB — BASIC METABOLIC PANEL
BUN: 11 mg/dL (ref 6–23)
CO2: 26 mEq/L (ref 19–32)
Calcium: 8.4 mg/dL (ref 8.4–10.5)
Chloride: 110 mEq/L (ref 96–112)
Creatinine, Ser: 0.96 mg/dL (ref 0.50–1.10)

## 2012-03-18 MED ORDER — METHOCARBAMOL 500 MG PO TABS: 500.0000 mg | ORAL_TABLET | Freq: Four times a day (QID) | ORAL | Status: AC | PRN

## 2012-03-18 MED ORDER — HYDROCODONE-ACETAMINOPHEN 5-325 MG PO TABS: 1.0000 | ORAL_TABLET | ORAL | Status: AC | PRN

## 2012-03-18 MED ORDER — ASPIRIN EC 325 MG PO TBEC: 325.0000 mg | DELAYED_RELEASE_TABLET | Freq: Two times a day (BID) | ORAL | Status: AC

## 2012-03-18 MED ORDER — DSS 100 MG PO CAPS: 100.0000 mg | ORAL_CAPSULE | Freq: Two times a day (BID) | ORAL | Status: AC

## 2012-03-18 MED ORDER — POLYETHYLENE GLYCOL 3350 17 G PO PACK: 17.0000 g | PACK | Freq: Two times a day (BID) | ORAL | Status: AC

## 2012-03-18 MED ORDER — FERROUS SULFATE 325 (65 FE) MG PO TABS: 325.0000 mg | ORAL_TABLET | Freq: Three times a day (TID) | ORAL | Status: DC

## 2012-03-18 MED ORDER — DIPHENHYDRAMINE HCL 25 MG PO CAPS: 25.0000 mg | ORAL_CAPSULE | Freq: Four times a day (QID) | ORAL | Status: DC | PRN

## 2012-03-18 NOTE — Progress Notes (Signed)
Physical Therapy Treatment Patient Details Name: Carrie Sanders MRN: [id number] DOB: 09/06/1927 Today's Date: 03/18/2012 Time: 7829-5621 PT Time Calculation (min): 23 min  PT Assessment / Plan / Recommendation Comments on Treatment Session       Follow Up Recommendations  Home health PT    Equipment Recommendations  None recommended by OT    Frequency 7X/week   Plan Discharge plan remains appropriate    Precautions / Restrictions Restrictions Weight Bearing Restrictions: Yes LLE Weight Bearing: Partial weight bearing LLE Partial Weight Bearing Percentage or Pounds: 50       Mobility  Bed Mobility Bed Mobility: Sit to Supine Supine to Sit: 4: Min guard Sit to Supine: 4: Min guard Details for Bed Mobility Assistance: cues for hand placement and technique. Transfers Transfers: Sit to Stand;Stand to Sit Sit to Stand: 5: Supervision;With upper extremity assist;With armrests;From bed;From toilet Stand to Sit: 5: Supervision;With upper extremity assist;With armrests;To bed;To toilet Details for Transfer Assistance: cues for increased UE WB, sequence, and position from RW Ambulation/Gait Ambulation/Gait Assistance: 4: Min guard Ambulation Distance (Feet): 74 Feet Assistive device: Rolling walker Ambulation/Gait Assistance Details: cues for position from RW, posture and to insure PWB Gait Pattern: Step-to pattern Stairs: Yes Stairs Assistance: 4: Min assist Stairs Assistance Details (indicate cue type and reason): cues for sequence and for foot/RW placement Stair Management Technique: Step to pattern;With walker;Backwards Number of Stairs: 2     Exercises     PT Goals Acute Rehab PT Goals PT Goal Formulation: With patient Time For Goal Achievement: 03/25/12 Potential to Achieve Goals: Good Pt will go Supine/Side to Sit: with supervision PT Goal: Supine/Side to Sit - Progress: Progressing toward goal Pt will go Sit to Supine/Side: with supervision PT Goal: Sit  to Supine/Side - Progress: Progressing toward goal Pt will go Sit to Stand: with supervision PT Goal: Sit to Stand - Progress: Progressing toward goal Pt will go Stand to Sit: with supervision PT Goal: Stand to Sit - Progress: Progressing toward goal Pt will Ambulate: 51 - 150 feet;with supervision;with rolling walker PT Goal: Ambulate - Progress: Progressing toward goal Pt will Go Up / Down Stairs: 1-2 stairs;with min assist;with least restrictive assistive device PT Goal: Up/Down Stairs - Progress: Progressing toward goal  Visit Information  Last PT Received On: 03/18/12 Assistance Needed: +1    Subjective Data  Subjective: I want to go home tomorrow   Cognition  Overall Cognitive Status: Appears within functional limits for tasks assessed/performed Arousal/Alertness: Awake/alert Orientation Level: Appears intact for tasks assessed Behavior During Session: [facility] for tasks performed    Balance     End of Session PT - End of Session Equipment Utilized During Treatment: Gait belt Activity Tolerance: Patient tolerated treatment well Patient left: in bed;with call bell/phone within reach;with family/visitor present Nurse Communication: Mobility status    Carrie Sanders 03/18/2012, 4:34 PM

## 2012-03-18 NOTE — Evaluation (Signed)
Occupational Therapy Evaluation Patient Details Name: Carrie Sanders MRN: [id number] DOB: [birth year] Today's Date: 03/18/2012 Time: 7829-5621 OT Time Calculation (min): 13 min  OT Assessment / Plan / Recommendation Clinical Impression  Pt doing very well POD#1 LTHR. Pt had other hip replaced 12 years ago. All education complete. Pt has all necessary DME and assist upon d/c.    OT Assessment  Patient does not need any further OT services    Follow Up Recommendations  No OT follow up    Equipment Recommendations  None recommended by OT    Frequency      Precautions / Restrictions Restrictions Weight Bearing Restrictions: Yes LLE Weight Bearing: Partial weight bearing LLE Partial Weight Bearing Percentage or Pounds: 50   Pertinent Vitals/Pain     ADL  Grooming: Simulated;Supervision/safety Where Assessed - Grooming: Standing at sink Upper Body Bathing: Simulated;Supervision/safety Where Assessed - Upper Body Bathing: Standing at sink Lower Body Bathing: Simulated;Minimal assistance Where Assessed - Lower Body Bathing: Sit to stand from bed Upper Body Dressing: Simulated;Supervision/safety Where Assessed - Upper Body Dressing: Standing Lower Body Dressing: Simulated;Minimal assistance Where Assessed - Lower Body Dressing: Sit to stand from bed Toilet Transfer: Performed;Supervision/safety Toilet Transfer Method: Proofreader: Regular height toilet Toileting - Clothing Manipulation: Performed;Supervision/safety Where Assessed - Toileting Clothing Manipulation: Sit to stand from 3-in-1 or toilet Toileting - Hygiene: Performed;Supervision/safety Where Assessed - Toileting Hygiene: Sit to stand from 3-in-1 or toilet Tub/Shower Transfer: Not assessed Tub/Shower Transfer Method: Not assessed Ambulation Related to ADLs: Pt ambulated to and from the bathroom with supervision.    OT Goals    Visit Information  Last OT Received On:  03/18/12 Assistance Needed: +1    Subjective Data  Subjective: "I had my other hip replaced 12 years ago." Patient Stated Goal: Agreeable to work with OT.   Prior Functioning  Home Living Lives With: Spouse Available Help at Discharge: Family Type of Home: House Home Access: Stairs to enter Secretary/administrator of Steps: 2 Entrance Stairs-Rails: Right Home Layout: One level Bathroom Shower/Tub: Health visitor: Standard Home Adaptive Equipment: Straight cane;Bedside commode/3-in-1;Grab bars in shower Prior Function Level of Independence: Independent with assistive device(s) Able to Take Stairs?: Yes Driving: Yes Vocation: Retired Musician: No difficulties Dominant Hand: Right    Cognition  Overall Cognitive Status: Appears within functional limits for tasks assessed/performed Arousal/Alertness: Awake/alert Orientation Level: Appears intact for tasks assessed Behavior During Session: [facility] for tasks performed    Extremity/Trunk Assessment Right Upper Extremity Assessment RUE ROM/Strength/Tone: Within functional levels RUE Sensation: WFL - Light Touch RUE Coordination: WFL - gross/fine motor Left Upper Extremity Assessment LUE ROM/Strength/Tone: Within functional levels LUE Sensation: WFL - Light Touch LUE Coordination: WFL - gross/fine motor Right Lower Extremity Assessment RLE ROM/Strength/Tone: Within functional levels RLE Coordination: WFL - gross motor Left Lower Extremity Assessment LLE ROM/Strength/Tone: Deficits LLE ROM/Strength/Tone Deficits: ROM WFL - strength 2+/5 at hip LLE Coordination: WFL - gross motor   Mobility Bed Mobility Bed Mobility: Sit to Supine Supine to Sit: 4: Min guard;With rails;HOB elevated Sit to Supine: 4: Min guard;HOB elevated Details for Bed Mobility Assistance: cues for hand placement and technique. Transfers Sit to Stand: 5: Supervision;With upper extremity assist;With armrests;From bed;From  toilet Stand to Sit: 5: Supervision;With upper extremity assist;With armrests;To bed;To toilet Details for Transfer Assistance: 1 VC for hand placement and LE position.   Exercise   Balance    End of Session OT - End of Session Activity Tolerance: Patient tolerated  treatment well Patient left: in bed;with call bell/phone within reach   Saia Derossett A OTR/L [phone] 03/18/2012, 12:06 PM

## 2012-03-18 NOTE — Clinical Documentation Improvement (Signed)
Anemia Blood Loss Clarification  THIS DOCUMENT IS NOT A PERMANENT PART OF THE MEDICAL RECORD  RESPOND TO THE THIS QUERY, FOLLOW THE INSTRUCTIONS BELOW:  1. If needed, update documentation for the patient's encounter via the notes activity.  2. Access this query again and click edit on the In [employer].  3. After updating, or not, click F2 to complete all highlighted (required) fields concerning your review. Select "additional documentation in the medical record" OR "no additional documentation provided".  4. Click Sign note button.  5. The deficiency will fall out of your In Basket *Please let us know if you are not able to complete this workflow by phone or e-mail (listed below).        03/18/12  Dear Dr. Charlann Boxer  Carrie Sanders  In an effort to better capture your patient's severity of illness, reflect appropriate length of stay and utilization of resources, a review of the patient medical record has revealed the following indicators.    Based on your clinical judgment, please clarify and document in a progress note and/or discharge summary the clinical condition associated with the following supporting information:  In responding to this query please exercise your independent judgment.  The fact that a query is asked, does not imply that any particular answer is desired or expected. Please document in progress notes  whether or not  "Anemia" noted on 03/18/12, can be further specified as any of the conditions below. Thank you.   Possible Clinical Conditions?   " Expected Acute Blood Loss Anemia  " Acute Blood Loss Anemia " Acute on chronic blood loss anemia  " Chronic blood loss anemia  " Precipitous drop in Hematocrit  " Other Condition________________  " Cannot Clinically Determine  Risk Factors: (recent surgery, pre op anemia, EBL in OR)  Supporting Information: Risk Factors:  Left total hip replacement 03/17/12 ,  EBL in OR=150  per Anesthesia Pre-0p Eval 03/17/12 :   HGB 11.7  Post OP H&H: 03/18/12    HGB 8.5    HCT  25.5  IV fluids / plasma expanders:NS with KCL 10 mEq @ 155ml/hr Serial H&H monitoring  Medications: Ferrous Sulfate 325 mg daily   Reviewed:  no additional documentation provided  Thank You, Andy Gauss RN  Clinical Documentation Specialist:  Pager [phone] E-mail [email or web address]  Health Information Management [place]

## 2012-03-18 NOTE — Progress Notes (Signed)
Subjective: 1 Day Post-Op Procedure(s) (LRB): TOTAL HIP ARTHROPLASTY ANTERIOR APPROACH (Left)   Patient reports pain as mild. Already doing quad set and heel slides with no pain. Pain well controlled. No events throughout the night. H&H a little low, but the patient states that she had chronic anemia, and has no symptoms at this time.  Objective:   VITALS:   Filed Vitals:   03/18/12 0542  BP: 101/61  Pulse: 86  Temp: 98.1 F (36.7 C)  Resp: 16    Neurovascular intact Dorsiflexion/Plantar flexion intact Incision: dressing C/D/I No cellulitis present Compartment soft  LABS  Basename 03/18/12 0358  HGB 8.5*  HCT 25.5*  WBC 7.9  PLT 132*     Basename 03/18/12 0358  NA 143  K 3.6  BUN 11  CREATININE 0.96  GLUCOSE 125*     Assessment/Plan: 1 Day Post-Op Procedure(s) (LRB): TOTAL HIP ARTHROPLASTY ANTERIOR APPROACH (Left)   Foley cath d/c'ed HV drain d/c'ed Advance diet Up with therapy, 50% WB on the left leg Plan for discharge tomorrow to home, if continues to do well.   Anastasio Auerbach Linsey Arteaga   PAC  03/18/2012, 8:53 AM

## 2012-03-18 NOTE — Evaluation (Signed)
Physical Therapy Evaluation Patient Details Name: Carrie Sanders MRN: [id number] DOB: 1927-11-10 Today's Date: 03/18/2012 Time: 0821-0850 PT Time Calculation (min): 29 min  PT Assessment / Plan / Recommendation Clinical Impression  Pt with L THR presents with decreased L LE strength/ROm and limitations in functional mobility.      PT Assessment  Patient needs continued PT services    Follow Up Recommendations  Home health PT    Equipment Recommendations  Rolling walker with 5" wheels (pt is 5'0)    Frequency 7X/week    Precautions / Restrictions Restrictions Weight Bearing Restrictions: Yes LLE Weight Bearing: Partial weight bearing LLE Partial Weight Bearing Percentage or Pounds: 50         Mobility  Bed Mobility Bed Mobility: Supine to Sit Supine to Sit: 4: Min assist Details for Bed Mobility Assistance: cues for sequence Transfers Transfers: Sit to Stand;Stand to Sit Sit to Stand: 4: Min assist (cues for use of UEs and for LE management) Stand to Sit: 4: Min assist (cues for use of UEs and for LE management) Ambulation/Gait Ambulation/Gait Assistance: 4: Min assist Ambulation Distance (Feet): 37 Feet Assistive device: Rolling walker Ambulation/Gait Assistance Details: cues for posture, position from RW and sequence to insure PWB Gait Pattern: Step-to pattern    Exercises Total Joint Exercises Ankle Circles/Pumps: AROM;10 reps;Supine;Both Quad Sets: AROM;10 reps;Supine;Both Heel Slides: AAROM;10 reps;Supine;Left Hip ABduction/ADduction: AAROM;10 reps;Supine;Left   PT Goals Acute Rehab PT Goals PT Goal Formulation: With patient Time For Goal Achievement: 03/25/12 Potential to Achieve Goals: Good Pt will go Supine/Side to Sit: with supervision PT Goal: Supine/Side to Sit - Progress: Goal set today Pt will go Sit to Supine/Side: with supervision PT Goal: Sit to Supine/Side - Progress: Goal set today Pt will go Sit to Stand: with supervision PT  Goal: Sit to Stand - Progress: Goal set today Pt will go Stand to Sit: with supervision PT Goal: Stand to Sit - Progress: Goal set today Pt will Ambulate: 51 - 150 feet;with supervision;with rolling walker PT Goal: Ambulate - Progress: Goal set today Pt will Go Up / Down Stairs: 1-2 stairs;with min assist;with least restrictive assistive device PT Goal: Up/Down Stairs - Progress: Goal set today  Visit Information  Last PT Received On: 03/18/12 Assistance Needed: +1    Subjective Data  Subjective: I am so happy to get out of this bed.  I want to go home tomorrow Patient Stated Goal: Be able to shop without having to use a cane   Prior Functioning  Home Living Lives With: Spouse Available Help at Discharge: Family Type of Home: House Home Access: Stairs to enter Secretary/administrator of Steps: 2 Entrance Stairs-Rails: Right Home Layout: One level Home Adaptive Equipment: Straight cane Prior Function Able to Take Stairs?: Yes Communication Communication: No difficulties Dominant Hand: Right    Cognition  Overall Cognitive Status: Appears within functional limits for tasks assessed/performed Arousal/Alertness: Awake/alert Orientation Level: Appears intact for tasks assessed Behavior During Session: [facility] for tasks performed    Extremity/Trunk Assessment Right Upper Extremity Assessment RUE ROM/Strength/Tone: Within functional levels RUE Coordination: WFL - gross motor Left Upper Extremity Assessment LUE ROM/Strength/Tone: Within functional levels LUE Coordination: WFL - gross motor Right Lower Extremity Assessment RLE ROM/Strength/Tone: Within functional levels RLE Coordination: WFL - gross motor Left Lower Extremity Assessment LLE ROM/Strength/Tone: Deficits LLE ROM/Strength/Tone Deficits: ROM WFL - strength 2+/5 at hip LLE Coordination: WFL - gross motor   Balance    End of Session PT - End of Session  Equipment Utilized During Treatment: Gait belt Activity  Tolerance: Patient tolerated treatment well Patient left: in chair;with call bell/phone within reach Nurse Communication: Mobility status   Carrie Sanders 03/18/2012, 9:16 AM

## 2012-03-19 LAB — CBC
HCT: 25 % — ABNORMAL LOW (ref 36.0–46.0)
Hemoglobin: 8.6 g/dL — ABNORMAL LOW (ref 12.0–15.0)
MCH: 32.2 pg (ref 26.0–34.0)
MCV: 93.6 fL (ref 78.0–100.0)
Platelets: 146 10*3/uL — ABNORMAL LOW (ref 150–400)
RBC: 2.67 MIL/uL — ABNORMAL LOW (ref 3.87–5.11)
WBC: 11.1 10*3/uL — ABNORMAL HIGH (ref 4.0–10.5)

## 2012-03-19 LAB — BASIC METABOLIC PANEL
BUN: 13 mg/dL (ref 6–23)
CO2: 27 mEq/L (ref 19–32)
Calcium: 8.6 mg/dL (ref 8.4–10.5)
Chloride: 110 mEq/L (ref 96–112)
Creatinine, Ser: 0.94 mg/dL (ref 0.50–1.10)
Glucose, Bld: 110 mg/dL — ABNORMAL HIGH (ref 70–99)

## 2012-03-19 NOTE — Progress Notes (Signed)
Subjective: 2 Days Post-Op Procedure(s) (LRB): TOTAL HIP ARTHROPLASTY ANTERIOR APPROACH (Left) Patient reports pain as mild.   Patient feels better and would like to go home  Objective: Vital signs in last 24 hours: Temp:  [97.4 F (36.3 C)-98.6 F (37 C)] 98.4 F (36.9 C) (04/20 0547) Pulse Rate:  [87-98] 98  (04/20 0547) Resp:  [14] 14  (04/20 0547) BP: (104-156)/(64-79) 156/79 mmHg (04/20 0547) SpO2:  [95 %-96 %] 95 % (04/20 0547)  Intake/Output from previous day:  Intake/Output Summary (Last 24 hours) at 03/19/12 0846 Last data filed at 03/19/12 0600  Gross per 24 hour  Intake    720 ml  Output    401 ml  Net    319 ml    Intake/Output this shift:    Labs:  Basename 03/19/12 0410 03/18/12 0358  HGB 8.6* 8.5*    Basename 03/19/12 0410 03/18/12 0358  WBC 11.1* 7.9  RBC 2.67* 2.68*  HCT 25.0* 25.5*  PLT 146* 132*    Basename 03/19/12 0410 03/18/12 0358  NA 142 143  K 3.5 3.6  CL 110 110  CO2 27 26  BUN 13 11  CREATININE 0.94 0.96  GLUCOSE 110* 125*  CALCIUM 8.6 8.4   No results found for this basename: LABPT:2,INR:2 in the last 72 hours  Exam - Neurologically intact Neurovascular intact Incision: dressing C/D/I Motor function intact - moving foot and toes well on exam.   Past Medical History  Diagnosis Date  . Hypertension   . Sinus drainage   . Constipation   . Sleeping difficulty   . H/O left breast biopsy     Assessment/Plan: 2 Days Post-Op Procedure(s) (LRB): TOTAL HIP ARTHROPLASTY ANTERIOR APPROACH (Left) Principal Problem:  *S/P Left THA, AA   Advance diet Up with therapy Discharge home with home health  DVT Prophylaxis - Aspirin WBAT left Leg  Kylan Veach V 03/19/2012, 8:46 AM

## 2012-03-19 NOTE — Progress Notes (Signed)
Discharged from floor via w/c, daughter with pt. No changes in assessment.  Javonne Dorko  

## 2012-03-19 NOTE — Progress Notes (Signed)
CM spoke with patient with daughter at bedside concerning dc planning. Per pt choice Carrie Sanders to provide [facility] services. RW present in room during interview. Pt states no need for other equipment. Adult daughter to assist in home care.     Carrie Sanders [phone]

## 2012-03-19 NOTE — Progress Notes (Signed)
Physical Therapy Treatment Patient Details Name: Carrie Sanders MRN: [id number] DOB: December 12, 1926 Today's Date: 03/19/2012 Time: 1478-2956 PT Time Calculation (min): 25 min  PT Assessment / Plan / Recommendation Comments on Treatment Session  Pt eager to go home today.  Pt given handout on THR Anterior Approach HEP and performed most of them.  Pt insttructed on use of ICE.      Follow Up Recommendations       Equipment Recommendations       Frequency     Plan Discharge plan remains appropriate    Precautions / Restrictions Precautions Precautions: None Precaution Comments: Direct Anterior Restrictions Weight Bearing Restrictions: Yes LLE Weight Bearing: Partial weight bearing LLE Partial Weight Bearing Percentage or Pounds: Pt aware she is PWB until her f/u MD appt   Pertinent Vitals/Pain     Mobility  Transfers Transfers: Sit to Stand Sit to Stand: 6: Modified independent (Device/Increase time) Stand to Sit: 6: Modified independent (Device/Increase time) Details for Transfer Assistance: good safety tech and use of hands Ambulation/Gait Ambulation/Gait Assistance: 5: Supervision Ambulation Distance (Feet): 65 Feet Assistive device: Rolling walker Ambulation/Gait Assistance Details: mild c/o B wrist discomfort from WB thru RW Gait Pattern: Step-to pattern;Decreased stance time - left;Decreased stride length Gait velocity: slow but steady General Gait Details: amb in hallway along with family member Stairs: No Wheelchair Mobility Wheelchair Mobility: No    Exercises Total Joint Exercises Ankle Circles/Pumps: AROM;Both;15 reps;Seated Quad Sets: AROM;Both;10 reps;Seated Gluteal Sets: AROM;Both;10 reps;Supine Short Arc Quad: AROM;Left;10 reps;Supine Heel Slides: AROM;Left;10 reps;Supine Hip ABduction/ADduction: AAROM;Left;10 reps;Supine Long Arc Quad: AROM;Left;10 reps;Seated   PT Goals Acute Rehab PT Goals PT Goal Formulation: With patient Potential to  Achieve Goals: Good Pt will go Supine/Side to Sit: with supervision PT Goal: Supine/Side to Sit - Progress: Met Pt will go Sit to Supine/Side: with supervision PT Goal: Sit to Supine/Side - Progress: Met Pt will go Sit to Stand: with supervision PT Goal: Sit to Stand - Progress: Met Pt will go Stand to Sit: with supervision PT Goal: Stand to Sit - Progress: Met Pt will Ambulate: 51 - 150 feet;with supervision;with rolling walker PT Goal: Ambulate - Progress: Met  Visit Information  Last PT Received On: 03/19/12 Assistance Needed: +1    Subjective Data  Subjective: I want to go home Patient Stated Goal: I want to do housework   Cognition  Overall Cognitive Status: Appears within functional limits for tasks assessed/performed Arousal/Alertness: Awake/alert Orientation Level: Appears intact for tasks assessed Behavior During Session: [facility] for tasks performed Cognition - Other Comments: Pleasant and motivated    Balance     End of Session PT - End of Session Equipment Utilized During Treatment: Gait belt Activity Tolerance: Patient tolerated treatment well Patient left: in chair;with call bell/phone within reach;with family/visitor present Nurse Communication: Other (comment) (Pt ready for D/C to home instructions)    Felecia Shelling  PTA [facility]  Acute  Rehab Pager     [phone]

## 2012-03-20 DIAGNOSIS — M19019 Primary osteoarthritis, unspecified shoulder: Secondary | ICD-10-CM | POA: Diagnosis not present

## 2012-03-20 DIAGNOSIS — I10 Essential (primary) hypertension: Secondary | ICD-10-CM | POA: Diagnosis not present

## 2012-03-20 DIAGNOSIS — Z471 Aftercare following joint replacement surgery: Secondary | ICD-10-CM | POA: Diagnosis not present

## 2012-03-20 DIAGNOSIS — Z96649 Presence of unspecified artificial hip joint: Secondary | ICD-10-CM | POA: Diagnosis not present

## 2012-03-20 DIAGNOSIS — M169 Osteoarthritis of hip, unspecified: Secondary | ICD-10-CM | POA: Diagnosis not present

## 2012-03-20 DIAGNOSIS — M1710 Unilateral primary osteoarthritis, unspecified knee: Secondary | ICD-10-CM | POA: Diagnosis not present

## 2012-03-20 NOTE — Discharge Summary (Signed)
Physician Discharge Summary  Patient ID: Carrie Sanders MRN: [id number] DOB/AGE: 1927/02/15 78 y.o.  Admit date: 03/17/2012 Discharge date: 03/19/2012  Procedures:  Procedure(s) (LRB): TOTAL HIP ARTHROPLASTY ANTERIOR APPROACH (Left)  Attending Physician:  Dr. Durene Romans   Admission Diagnoses: Left hip OA and pain   Discharge Diagnoses:  Principal Problem:  *S/P Left THA, AA Hypertension   Sinus drainage   Constipation   Sleeping difficulty   H/O left breast biopsy    HPI: Pt is a 78 y.o. female complaining of left hip pain for 2.5-3 years. Pain had continually increased since the beginning, especially in the last 6 months. X-rays in the clinic show end-stage arthritic changes of the left hip. Pt has tried various conservative treatments which have failed to alleviate their symptoms including NSAIDs. Various options are discussed with the patient. Risks, benefits and expectations were discussed with the patient. Patient understand the risks, benefits and expectations and wishes to proceed with surgery.   PCP: No primary provider on file.   Discharged Condition: good  Hospital Course:  Patient underwent the above stated procedure on 03/17/2012. Patient tolerated the procedure well and brought to the recovery room in good condition and subsequently to the floor.  POD #1 BP: 101/61 ; Pulse: 86 ; Temp: 98.1 F (36.7 C) ; Resp: 16  Pt's foley was removed, as well as the hemovac drain removed. IV was changed to a saline lock. Patient reports pain as mild. Already doing quad set and heel slides with no pain. Pain well controlled. No events throughout the night. H&H a little low, but the patient states that she had chronic anemia, and has no symptoms at this time.  LABS  Basename  03/18/12 0358   HGB  8.5  HCT  25.5    POD #2  BP: 156/79 ; Pulse: 98 ; Temp: 98.4 F (36.9 C) ; Resp: 14 Patient reports pain as mild.  Patient feels better and would like to go  home. Neurologically intact, neurovascular intact, incision: dressing C/D/I and motor function intact - moving foot and toes. well on exam.   LABS  Basename  03/19/12 0410   HGB  8.6  HCT  25.0    Discharge Exam: General appearance: alert, cooperative and no distress Extremities: Homans sign is negative, no sign of DVT, no edema, redness or tenderness in the calves or thighs and no ulcers, gangrene or trophic changes  Disposition: Home  with follow up in 2 weeks  Follow-up Information    Follow up with OLIN,Shayra Anton D in 2 weeks.   Contact information:   [facility] [address], [address] [place] [address] [phone]          Discharge Orders    Future Orders Please Complete By Expires   Diet - low sodium heart healthy      Call MD / Call 911      Comments:   If you experience chest pain or shortness of breath, CALL 911 and be transported to the hospital emergency room.  If you develope a fever above 101 F, pus (white drainage) or increased drainage or redness at the wound, or calf pain, call your surgeon's office.   Discharge instructions      Comments:   Maintain surgical dressing for 8 days, then replace with gauze and tape. Keep the area dry and clean until follow up. Follow up in 2 weeks at [facility]. Call with any questions or concerns.  Constipation Prevention      Comments:   Drink plenty of fluids.  Prune juice may be helpful.  You may use a stool softener, such as Colace (over the counter) 100 mg twice a day.  Use MiraLax (over the counter) for constipation as needed.   Increase activity slowly as tolerated      Weight Bearing as taught in Physical Therapy      Comments:   Use a walker or crutches as instructed.   Driving restrictions      Comments:   No driving for 4 weeks   Change dressing      Comments:   Maintain surgical dressing for 8 days, then replace with 4x4 guaze and tape. Keep the area dry  and clean.   TED hose      Comments:   Use stockings (TED hose) for 2 weeks on both leg(s).  You may remove them at night for sleeping.      Discharge Medication List as of 03/19/2012 10:58 AM    START taking these medications   Details  aspirin EC 325 MG tablet Take 1 tablet (325 mg total) by mouth 2 (two) times daily. X 4 weeks, Starting 03/18/2012, Until Mon 03/28/12, No Print    diphenhydrAMINE (BENADRYL) 25 mg capsule Take 1 capsule (25 mg total) by mouth every 6 (six) hours as needed for itching, allergies or sleep., Starting 03/18/2012, Until Mon 03/28/12, No Print    docusate sodium 100 MG CAPS Take 100 mg by mouth 2 (two) times daily., Starting 03/18/2012, Until Mon 03/28/12, No Print    ferrous sulfate 325 (65 FE) MG tablet Take 1 tablet (325 mg total) by mouth 3 (three) times daily after meals., Starting 03/18/2012, Until Sat 03/18/13, No Print    HYDROcodone-acetaminophen (NORCO) 5-325 MG per tablet Take 1-2 tablets by mouth every 4 (four) hours as needed for pain., Starting 03/18/2012, Until Mon 03/28/12, Print    methocarbamol (ROBAXIN) 500 MG tablet Take 1 tablet (500 mg total) by mouth every 6 (six) hours as needed (muscle spasms)., Starting 03/18/2012, Until Mon 03/28/12, No Print    polyethylene glycol (MIRALAX / GLYCOLAX) packet Take 17 g by mouth 2 (two) times daily., Starting 03/18/2012, Until Mon 03/21/12, No Print      CONTINUE these medications which have NOT CHANGED   Details  bumetanide (BUMEX) 1 MG tablet Take 1 mg by mouth daily after breakfast., Until Discontinued, Historical Med    Calcium Carbonate-Vitamin D (CALCIUM + D PO) Take 1 tablet by mouth daily., Until Discontinued, Historical Med    losartan (COZAAR) 100 MG tablet Take 50 mg by mouth 2 (two) times daily., Until Discontinued, Historical Med    rosuvastatin (CRESTOR) 20 MG tablet Take 20 mg by mouth daily after breakfast., Until Discontinued, Historical Med      STOP taking these medications      ibuprofen (ADVIL,MOTRIN) 200 MG tablet Comments:  Reason for Stopping:       traMADol (ULTRAM) 50 MG tablet Comments:  Reason for Stopping:       naproxen sodium (ANAPROX) 220 MG tablet Comments:  Reason for Stopping:          Signed: Anastasio Auerbach. Hidaya Daniel   PAC  03/20/2012, 8:48 PM

## 2012-03-22 DIAGNOSIS — Z471 Aftercare following joint replacement surgery: Secondary | ICD-10-CM | POA: Diagnosis not present

## 2012-03-22 DIAGNOSIS — M19019 Primary osteoarthritis, unspecified shoulder: Secondary | ICD-10-CM | POA: Diagnosis not present

## 2012-03-22 DIAGNOSIS — Z96649 Presence of unspecified artificial hip joint: Secondary | ICD-10-CM | POA: Diagnosis not present

## 2012-03-22 DIAGNOSIS — I10 Essential (primary) hypertension: Secondary | ICD-10-CM | POA: Diagnosis not present

## 2012-03-22 DIAGNOSIS — M1710 Unilateral primary osteoarthritis, unspecified knee: Secondary | ICD-10-CM | POA: Diagnosis not present

## 2012-03-25 DIAGNOSIS — Z96649 Presence of unspecified artificial hip joint: Secondary | ICD-10-CM | POA: Diagnosis not present

## 2012-03-25 DIAGNOSIS — I10 Essential (primary) hypertension: Secondary | ICD-10-CM | POA: Diagnosis not present

## 2012-03-25 DIAGNOSIS — Z471 Aftercare following joint replacement surgery: Secondary | ICD-10-CM | POA: Diagnosis not present

## 2012-03-25 DIAGNOSIS — M1710 Unilateral primary osteoarthritis, unspecified knee: Secondary | ICD-10-CM | POA: Diagnosis not present

## 2012-03-25 DIAGNOSIS — M19019 Primary osteoarthritis, unspecified shoulder: Secondary | ICD-10-CM | POA: Diagnosis not present

## 2012-03-28 ENCOUNTER — Encounter (HOSPITAL_COMMUNITY): Payer: Self-pay | Admitting: Orthopedic Surgery

## 2012-03-28 DIAGNOSIS — Z471 Aftercare following joint replacement surgery: Secondary | ICD-10-CM | POA: Diagnosis not present

## 2012-03-28 DIAGNOSIS — I10 Essential (primary) hypertension: Secondary | ICD-10-CM | POA: Diagnosis not present

## 2012-03-28 DIAGNOSIS — Z96649 Presence of unspecified artificial hip joint: Secondary | ICD-10-CM | POA: Diagnosis not present

## 2012-03-28 DIAGNOSIS — M1710 Unilateral primary osteoarthritis, unspecified knee: Secondary | ICD-10-CM | POA: Diagnosis not present

## 2012-03-28 DIAGNOSIS — M19019 Primary osteoarthritis, unspecified shoulder: Secondary | ICD-10-CM | POA: Diagnosis not present

## 2012-03-29 DIAGNOSIS — I10 Essential (primary) hypertension: Secondary | ICD-10-CM | POA: Diagnosis not present

## 2012-03-29 DIAGNOSIS — Z96649 Presence of unspecified artificial hip joint: Secondary | ICD-10-CM | POA: Diagnosis not present

## 2012-03-29 DIAGNOSIS — M1710 Unilateral primary osteoarthritis, unspecified knee: Secondary | ICD-10-CM | POA: Diagnosis not present

## 2012-03-29 DIAGNOSIS — Z471 Aftercare following joint replacement surgery: Secondary | ICD-10-CM | POA: Diagnosis not present

## 2012-03-29 DIAGNOSIS — M19019 Primary osteoarthritis, unspecified shoulder: Secondary | ICD-10-CM | POA: Diagnosis not present

## 2012-03-31 DIAGNOSIS — M19019 Primary osteoarthritis, unspecified shoulder: Secondary | ICD-10-CM | POA: Diagnosis not present

## 2012-03-31 DIAGNOSIS — Z96649 Presence of unspecified artificial hip joint: Secondary | ICD-10-CM | POA: Diagnosis not present

## 2012-03-31 DIAGNOSIS — Z471 Aftercare following joint replacement surgery: Secondary | ICD-10-CM | POA: Diagnosis not present

## 2012-03-31 DIAGNOSIS — I10 Essential (primary) hypertension: Secondary | ICD-10-CM | POA: Diagnosis not present

## 2012-03-31 DIAGNOSIS — M1710 Unilateral primary osteoarthritis, unspecified knee: Secondary | ICD-10-CM | POA: Diagnosis not present

## 2012-04-04 DIAGNOSIS — Z471 Aftercare following joint replacement surgery: Secondary | ICD-10-CM | POA: Diagnosis not present

## 2012-04-04 DIAGNOSIS — Z96649 Presence of unspecified artificial hip joint: Secondary | ICD-10-CM | POA: Diagnosis not present

## 2012-04-04 DIAGNOSIS — M1710 Unilateral primary osteoarthritis, unspecified knee: Secondary | ICD-10-CM | POA: Diagnosis not present

## 2012-04-04 DIAGNOSIS — M19019 Primary osteoarthritis, unspecified shoulder: Secondary | ICD-10-CM | POA: Diagnosis not present

## 2012-04-04 DIAGNOSIS — I10 Essential (primary) hypertension: Secondary | ICD-10-CM | POA: Diagnosis not present

## 2012-04-06 DIAGNOSIS — M19019 Primary osteoarthritis, unspecified shoulder: Secondary | ICD-10-CM | POA: Diagnosis not present

## 2012-04-06 DIAGNOSIS — Z471 Aftercare following joint replacement surgery: Secondary | ICD-10-CM | POA: Diagnosis not present

## 2012-04-06 DIAGNOSIS — I10 Essential (primary) hypertension: Secondary | ICD-10-CM | POA: Diagnosis not present

## 2012-04-06 DIAGNOSIS — Z96649 Presence of unspecified artificial hip joint: Secondary | ICD-10-CM | POA: Diagnosis not present

## 2012-04-06 DIAGNOSIS — M1710 Unilateral primary osteoarthritis, unspecified knee: Secondary | ICD-10-CM | POA: Diagnosis not present

## 2012-04-08 DIAGNOSIS — I10 Essential (primary) hypertension: Secondary | ICD-10-CM | POA: Diagnosis not present

## 2012-04-08 DIAGNOSIS — Z96649 Presence of unspecified artificial hip joint: Secondary | ICD-10-CM | POA: Diagnosis not present

## 2012-04-08 DIAGNOSIS — Z471 Aftercare following joint replacement surgery: Secondary | ICD-10-CM | POA: Diagnosis not present

## 2012-04-08 DIAGNOSIS — M1710 Unilateral primary osteoarthritis, unspecified knee: Secondary | ICD-10-CM | POA: Diagnosis not present

## 2012-04-08 DIAGNOSIS — M19019 Primary osteoarthritis, unspecified shoulder: Secondary | ICD-10-CM | POA: Diagnosis not present

## 2012-04-11 DIAGNOSIS — I10 Essential (primary) hypertension: Secondary | ICD-10-CM | POA: Diagnosis not present

## 2012-04-11 DIAGNOSIS — M1710 Unilateral primary osteoarthritis, unspecified knee: Secondary | ICD-10-CM | POA: Diagnosis not present

## 2012-04-11 DIAGNOSIS — Z96649 Presence of unspecified artificial hip joint: Secondary | ICD-10-CM | POA: Diagnosis not present

## 2012-04-11 DIAGNOSIS — M19019 Primary osteoarthritis, unspecified shoulder: Secondary | ICD-10-CM | POA: Diagnosis not present

## 2012-04-11 DIAGNOSIS — Z471 Aftercare following joint replacement surgery: Secondary | ICD-10-CM | POA: Diagnosis not present

## 2012-04-13 DIAGNOSIS — M19019 Primary osteoarthritis, unspecified shoulder: Secondary | ICD-10-CM | POA: Diagnosis not present

## 2012-04-13 DIAGNOSIS — Z471 Aftercare following joint replacement surgery: Secondary | ICD-10-CM | POA: Diagnosis not present

## 2012-04-13 DIAGNOSIS — I10 Essential (primary) hypertension: Secondary | ICD-10-CM | POA: Diagnosis not present

## 2012-04-13 DIAGNOSIS — Z96649 Presence of unspecified artificial hip joint: Secondary | ICD-10-CM | POA: Diagnosis not present

## 2012-04-13 DIAGNOSIS — M1710 Unilateral primary osteoarthritis, unspecified knee: Secondary | ICD-10-CM | POA: Diagnosis not present

## 2012-04-14 DIAGNOSIS — M19019 Primary osteoarthritis, unspecified shoulder: Secondary | ICD-10-CM | POA: Diagnosis not present

## 2012-04-14 DIAGNOSIS — Z96649 Presence of unspecified artificial hip joint: Secondary | ICD-10-CM | POA: Diagnosis not present

## 2012-04-14 DIAGNOSIS — M1710 Unilateral primary osteoarthritis, unspecified knee: Secondary | ICD-10-CM | POA: Diagnosis not present

## 2012-04-14 DIAGNOSIS — I10 Essential (primary) hypertension: Secondary | ICD-10-CM | POA: Diagnosis not present

## 2012-04-14 DIAGNOSIS — Z471 Aftercare following joint replacement surgery: Secondary | ICD-10-CM | POA: Diagnosis not present

## 2012-04-28 DIAGNOSIS — Z1231 Encounter for screening mammogram for malignant neoplasm of breast: Secondary | ICD-10-CM | POA: Diagnosis not present

## 2012-04-28 DIAGNOSIS — H43819 Vitreous degeneration, unspecified eye: Secondary | ICD-10-CM | POA: Diagnosis not present

## 2012-04-28 DIAGNOSIS — H3589 Other specified retinal disorders: Secondary | ICD-10-CM | POA: Diagnosis not present

## 2012-04-28 DIAGNOSIS — Z961 Presence of intraocular lens: Secondary | ICD-10-CM | POA: Diagnosis not present

## 2012-04-28 DIAGNOSIS — H04129 Dry eye syndrome of unspecified lacrimal gland: Secondary | ICD-10-CM | POA: Diagnosis not present

## 2012-05-05 DIAGNOSIS — I10 Essential (primary) hypertension: Secondary | ICD-10-CM | POA: Diagnosis not present

## 2012-05-05 DIAGNOSIS — M1990 Unspecified osteoarthritis, unspecified site: Secondary | ICD-10-CM | POA: Diagnosis not present

## 2012-05-05 DIAGNOSIS — E785 Hyperlipidemia, unspecified: Secondary | ICD-10-CM | POA: Diagnosis not present

## 2012-05-11 DIAGNOSIS — M169 Osteoarthritis of hip, unspecified: Secondary | ICD-10-CM | POA: Diagnosis not present

## 2012-05-12 DIAGNOSIS — H35379 Puckering of macula, unspecified eye: Secondary | ICD-10-CM | POA: Diagnosis not present

## 2012-05-12 DIAGNOSIS — H43819 Vitreous degeneration, unspecified eye: Secondary | ICD-10-CM | POA: Diagnosis not present

## 2012-05-12 DIAGNOSIS — H35359 Cystoid macular degeneration, unspecified eye: Secondary | ICD-10-CM | POA: Diagnosis not present

## 2012-06-27 DIAGNOSIS — I10 Essential (primary) hypertension: Secondary | ICD-10-CM | POA: Diagnosis not present

## 2012-06-27 DIAGNOSIS — D649 Anemia, unspecified: Secondary | ICD-10-CM | POA: Diagnosis not present

## 2012-08-18 DIAGNOSIS — H35379 Puckering of macula, unspecified eye: Secondary | ICD-10-CM | POA: Diagnosis not present

## 2012-08-18 DIAGNOSIS — H43819 Vitreous degeneration, unspecified eye: Secondary | ICD-10-CM | POA: Diagnosis not present

## 2012-08-18 DIAGNOSIS — H35359 Cystoid macular degeneration, unspecified eye: Secondary | ICD-10-CM | POA: Diagnosis not present

## 2012-08-25 DIAGNOSIS — D508 Other iron deficiency anemias: Secondary | ICD-10-CM | POA: Diagnosis not present

## 2012-08-25 DIAGNOSIS — D509 Iron deficiency anemia, unspecified: Secondary | ICD-10-CM | POA: Diagnosis not present

## 2012-08-25 DIAGNOSIS — I10 Essential (primary) hypertension: Secondary | ICD-10-CM | POA: Diagnosis not present

## 2012-08-25 DIAGNOSIS — E785 Hyperlipidemia, unspecified: Secondary | ICD-10-CM | POA: Diagnosis not present

## 2012-08-25 DIAGNOSIS — Z23 Encounter for immunization: Secondary | ICD-10-CM | POA: Diagnosis not present

## 2012-09-15 DIAGNOSIS — I10 Essential (primary) hypertension: Secondary | ICD-10-CM | POA: Diagnosis not present

## 2012-12-05 DIAGNOSIS — R82998 Other abnormal findings in urine: Secondary | ICD-10-CM | POA: Diagnosis not present

## 2012-12-05 DIAGNOSIS — N760 Acute vaginitis: Secondary | ICD-10-CM | POA: Diagnosis not present

## 2012-12-05 DIAGNOSIS — Z01419 Encounter for gynecological examination (general) (routine) without abnormal findings: Secondary | ICD-10-CM | POA: Diagnosis not present

## 2012-12-05 DIAGNOSIS — Z0000 Encounter for general adult medical examination without abnormal findings: Secondary | ICD-10-CM | POA: Diagnosis not present

## 2012-12-05 DIAGNOSIS — Z124 Encounter for screening for malignant neoplasm of cervix: Secondary | ICD-10-CM | POA: Diagnosis not present

## 2012-12-05 DIAGNOSIS — R319 Hematuria, unspecified: Secondary | ICD-10-CM | POA: Diagnosis not present

## 2012-12-12 DIAGNOSIS — D509 Iron deficiency anemia, unspecified: Secondary | ICD-10-CM | POA: Diagnosis not present

## 2012-12-12 DIAGNOSIS — I10 Essential (primary) hypertension: Secondary | ICD-10-CM | POA: Diagnosis not present

## 2012-12-12 DIAGNOSIS — E559 Vitamin D deficiency, unspecified: Secondary | ICD-10-CM | POA: Diagnosis not present

## 2012-12-12 DIAGNOSIS — D508 Other iron deficiency anemias: Secondary | ICD-10-CM | POA: Diagnosis not present

## 2012-12-12 DIAGNOSIS — E785 Hyperlipidemia, unspecified: Secondary | ICD-10-CM | POA: Diagnosis not present

## 2013-02-23 DIAGNOSIS — H35379 Puckering of macula, unspecified eye: Secondary | ICD-10-CM | POA: Diagnosis not present

## 2013-05-04 DIAGNOSIS — Z961 Presence of intraocular lens: Secondary | ICD-10-CM | POA: Diagnosis not present

## 2013-05-04 DIAGNOSIS — H35379 Puckering of macula, unspecified eye: Secondary | ICD-10-CM | POA: Diagnosis not present

## 2013-05-04 DIAGNOSIS — H04129 Dry eye syndrome of unspecified lacrimal gland: Secondary | ICD-10-CM | POA: Diagnosis not present

## 2013-05-04 DIAGNOSIS — Z1231 Encounter for screening mammogram for malignant neoplasm of breast: Secondary | ICD-10-CM | POA: Diagnosis not present

## 2013-06-07 DIAGNOSIS — D649 Anemia, unspecified: Secondary | ICD-10-CM | POA: Diagnosis not present

## 2013-06-07 DIAGNOSIS — M1990 Unspecified osteoarthritis, unspecified site: Secondary | ICD-10-CM | POA: Diagnosis not present

## 2013-06-07 DIAGNOSIS — I10 Essential (primary) hypertension: Secondary | ICD-10-CM | POA: Diagnosis not present

## 2013-06-07 DIAGNOSIS — E785 Hyperlipidemia, unspecified: Secondary | ICD-10-CM | POA: Diagnosis not present

## 2013-08-31 DIAGNOSIS — H35379 Puckering of macula, unspecified eye: Secondary | ICD-10-CM | POA: Diagnosis not present

## 2013-08-31 DIAGNOSIS — H35359 Cystoid macular degeneration, unspecified eye: Secondary | ICD-10-CM | POA: Diagnosis not present

## 2013-10-31 DIAGNOSIS — Z23 Encounter for immunization: Secondary | ICD-10-CM | POA: Diagnosis not present

## 2013-12-08 DIAGNOSIS — E559 Vitamin D deficiency, unspecified: Secondary | ICD-10-CM | POA: Diagnosis not present

## 2013-12-08 DIAGNOSIS — E785 Hyperlipidemia, unspecified: Secondary | ICD-10-CM | POA: Diagnosis not present

## 2013-12-08 DIAGNOSIS — I10 Essential (primary) hypertension: Secondary | ICD-10-CM | POA: Diagnosis not present

## 2013-12-15 DIAGNOSIS — D649 Anemia, unspecified: Secondary | ICD-10-CM | POA: Diagnosis not present

## 2013-12-15 DIAGNOSIS — G589 Mononeuropathy, unspecified: Secondary | ICD-10-CM | POA: Diagnosis not present

## 2013-12-15 DIAGNOSIS — I10 Essential (primary) hypertension: Secondary | ICD-10-CM | POA: Diagnosis not present

## 2013-12-15 DIAGNOSIS — E785 Hyperlipidemia, unspecified: Secondary | ICD-10-CM | POA: Diagnosis not present

## 2014-05-03 DIAGNOSIS — Z961 Presence of intraocular lens: Secondary | ICD-10-CM | POA: Diagnosis not present

## 2014-05-03 DIAGNOSIS — H35379 Puckering of macula, unspecified eye: Secondary | ICD-10-CM | POA: Diagnosis not present

## 2014-05-03 DIAGNOSIS — H04129 Dry eye syndrome of unspecified lacrimal gland: Secondary | ICD-10-CM | POA: Diagnosis not present

## 2014-06-12 DIAGNOSIS — I10 Essential (primary) hypertension: Secondary | ICD-10-CM | POA: Diagnosis not present

## 2014-06-14 DIAGNOSIS — D649 Anemia, unspecified: Secondary | ICD-10-CM | POA: Diagnosis not present

## 2014-06-14 DIAGNOSIS — M810 Age-related osteoporosis without current pathological fracture: Secondary | ICD-10-CM | POA: Diagnosis not present

## 2014-06-14 DIAGNOSIS — E785 Hyperlipidemia, unspecified: Secondary | ICD-10-CM | POA: Diagnosis not present

## 2014-06-14 DIAGNOSIS — I10 Essential (primary) hypertension: Secondary | ICD-10-CM | POA: Diagnosis not present

## 2014-10-18 DIAGNOSIS — Z23 Encounter for immunization: Secondary | ICD-10-CM | POA: Diagnosis not present

## 2014-12-07 DIAGNOSIS — I10 Essential (primary) hypertension: Secondary | ICD-10-CM | POA: Diagnosis not present

## 2014-12-07 DIAGNOSIS — E785 Hyperlipidemia, unspecified: Secondary | ICD-10-CM | POA: Diagnosis not present

## 2014-12-11 DIAGNOSIS — E782 Mixed hyperlipidemia: Secondary | ICD-10-CM | POA: Diagnosis not present

## 2014-12-11 DIAGNOSIS — G629 Polyneuropathy, unspecified: Secondary | ICD-10-CM | POA: Diagnosis not present

## 2014-12-11 DIAGNOSIS — R944 Abnormal results of kidney function studies: Secondary | ICD-10-CM | POA: Diagnosis not present

## 2014-12-11 DIAGNOSIS — I10 Essential (primary) hypertension: Secondary | ICD-10-CM | POA: Diagnosis not present

## 2015-05-16 DIAGNOSIS — H35372 Puckering of macula, left eye: Secondary | ICD-10-CM | POA: Diagnosis not present

## 2015-05-16 DIAGNOSIS — H43811 Vitreous degeneration, right eye: Secondary | ICD-10-CM | POA: Diagnosis not present

## 2015-05-16 DIAGNOSIS — H04123 Dry eye syndrome of bilateral lacrimal glands: Secondary | ICD-10-CM | POA: Diagnosis not present

## 2015-05-16 DIAGNOSIS — Z961 Presence of intraocular lens: Secondary | ICD-10-CM | POA: Diagnosis not present

## 2015-05-17 DIAGNOSIS — Z96641 Presence of right artificial hip joint: Secondary | ICD-10-CM | POA: Diagnosis not present

## 2015-05-17 DIAGNOSIS — Z471 Aftercare following joint replacement surgery: Secondary | ICD-10-CM | POA: Diagnosis not present

## 2015-05-17 DIAGNOSIS — M545 Low back pain: Secondary | ICD-10-CM | POA: Diagnosis not present

## 2015-05-17 DIAGNOSIS — Z96642 Presence of left artificial hip joint: Secondary | ICD-10-CM | POA: Diagnosis not present

## 2015-06-04 DIAGNOSIS — E782 Mixed hyperlipidemia: Secondary | ICD-10-CM | POA: Diagnosis not present

## 2015-06-04 DIAGNOSIS — I10 Essential (primary) hypertension: Secondary | ICD-10-CM | POA: Diagnosis not present

## 2015-06-06 DIAGNOSIS — I10 Essential (primary) hypertension: Secondary | ICD-10-CM | POA: Diagnosis not present

## 2015-06-06 DIAGNOSIS — D649 Anemia, unspecified: Secondary | ICD-10-CM | POA: Diagnosis not present

## 2015-06-06 DIAGNOSIS — N182 Chronic kidney disease, stage 2 (mild): Secondary | ICD-10-CM | POA: Diagnosis not present

## 2015-06-06 DIAGNOSIS — M25559 Pain in unspecified hip: Secondary | ICD-10-CM | POA: Diagnosis not present

## 2015-07-18 DIAGNOSIS — M545 Low back pain: Secondary | ICD-10-CM | POA: Diagnosis not present

## 2015-07-18 DIAGNOSIS — Z96641 Presence of right artificial hip joint: Secondary | ICD-10-CM | POA: Diagnosis not present

## 2015-07-18 DIAGNOSIS — Z471 Aftercare following joint replacement surgery: Secondary | ICD-10-CM | POA: Diagnosis not present

## 2015-07-18 DIAGNOSIS — M5136 Other intervertebral disc degeneration, lumbar region: Secondary | ICD-10-CM | POA: Diagnosis not present

## 2015-09-11 DIAGNOSIS — Z23 Encounter for immunization: Secondary | ICD-10-CM | POA: Diagnosis not present

## 2015-09-16 DIAGNOSIS — I10 Essential (primary) hypertension: Secondary | ICD-10-CM | POA: Diagnosis not present

## 2015-12-17 DIAGNOSIS — M5136 Other intervertebral disc degeneration, lumbar region: Secondary | ICD-10-CM | POA: Diagnosis not present

## 2016-02-26 DIAGNOSIS — M5136 Other intervertebral disc degeneration, lumbar region: Secondary | ICD-10-CM | POA: Diagnosis not present

## 2016-03-12 DIAGNOSIS — D509 Iron deficiency anemia, unspecified: Secondary | ICD-10-CM | POA: Diagnosis not present

## 2016-03-12 DIAGNOSIS — E782 Mixed hyperlipidemia: Secondary | ICD-10-CM | POA: Diagnosis not present

## 2016-03-12 DIAGNOSIS — N182 Chronic kidney disease, stage 2 (mild): Secondary | ICD-10-CM | POA: Diagnosis not present

## 2016-03-12 DIAGNOSIS — E559 Vitamin D deficiency, unspecified: Secondary | ICD-10-CM | POA: Diagnosis not present

## 2016-03-12 DIAGNOSIS — I10 Essential (primary) hypertension: Secondary | ICD-10-CM | POA: Diagnosis not present

## 2016-03-12 DIAGNOSIS — D649 Anemia, unspecified: Secondary | ICD-10-CM | POA: Diagnosis not present

## 2016-03-16 DIAGNOSIS — N182 Chronic kidney disease, stage 2 (mild): Secondary | ICD-10-CM | POA: Diagnosis not present

## 2016-03-16 DIAGNOSIS — I10 Essential (primary) hypertension: Secondary | ICD-10-CM | POA: Diagnosis not present

## 2016-03-16 DIAGNOSIS — E782 Mixed hyperlipidemia: Secondary | ICD-10-CM | POA: Diagnosis not present

## 2016-03-16 DIAGNOSIS — D649 Anemia, unspecified: Secondary | ICD-10-CM | POA: Diagnosis not present

## 2016-03-30 DIAGNOSIS — L723 Sebaceous cyst: Secondary | ICD-10-CM | POA: Diagnosis not present

## 2016-03-30 DIAGNOSIS — D0439 Carcinoma in situ of skin of other parts of face: Secondary | ICD-10-CM | POA: Diagnosis not present

## 2016-03-30 DIAGNOSIS — L570 Actinic keratosis: Secondary | ICD-10-CM | POA: Diagnosis not present

## 2016-03-30 DIAGNOSIS — X32XXXA Exposure to sunlight, initial encounter: Secondary | ICD-10-CM | POA: Diagnosis not present

## 2016-05-12 DIAGNOSIS — Z08 Encounter for follow-up examination after completed treatment for malignant neoplasm: Secondary | ICD-10-CM | POA: Diagnosis not present

## 2016-05-12 DIAGNOSIS — Z85828 Personal history of other malignant neoplasm of skin: Secondary | ICD-10-CM | POA: Diagnosis not present

## 2016-05-12 DIAGNOSIS — X32XXXD Exposure to sunlight, subsequent encounter: Secondary | ICD-10-CM | POA: Diagnosis not present

## 2016-05-12 DIAGNOSIS — L570 Actinic keratosis: Secondary | ICD-10-CM | POA: Diagnosis not present

## 2016-05-20 DIAGNOSIS — M5136 Other intervertebral disc degeneration, lumbar region: Secondary | ICD-10-CM | POA: Diagnosis not present

## 2016-05-26 ENCOUNTER — Encounter: Payer: Self-pay | Admitting: Neurology

## 2016-05-26 ENCOUNTER — Ambulatory Visit (INDEPENDENT_AMBULATORY_CARE_PROVIDER_SITE_OTHER): Payer: Medicare Other | Admitting: Neurology

## 2016-05-26 VITALS — BP 167/87 | HR 98 | Ht 59.0 in | Wt 130.5 lb

## 2016-05-26 DIAGNOSIS — R202 Paresthesia of skin: Secondary | ICD-10-CM | POA: Diagnosis not present

## 2016-05-26 DIAGNOSIS — R269 Unspecified abnormalities of gait and mobility: Secondary | ICD-10-CM | POA: Diagnosis not present

## 2016-05-26 DIAGNOSIS — E538 Deficiency of other specified B group vitamins: Secondary | ICD-10-CM | POA: Diagnosis not present

## 2016-05-26 HISTORY — DX: Paresthesia of skin: R20.2

## 2016-05-26 HISTORY — DX: Unspecified abnormalities of gait and mobility: R26.9

## 2016-05-26 MED ORDER — GABAPENTIN 100 MG PO CAPS: 100.0000 mg | ORAL_CAPSULE | Freq: Three times a day (TID) | ORAL | Status: DC

## 2016-05-26 NOTE — Progress Notes (Signed)
Reason for visit: Numbness  Referring physician: Dr. Grant Fontanaamos  Carrie Sanders is a 82 y.o. female  History of present illness:  Carrie Sanders is an 82 year old right-handed white female with a history of a gait disorder dating back at least 6 months. The patient has been using a cane for ambulation, she reports no falls. She has had a two-month history of significant worsening of low back pain, some burning sensations in the back and going down both legs to the feet. She reports that she has lost sensation with the bowels and the bladder, she has not had incontinence however with the bowels and bladder. The patient has chronic constipation issues. She reports some weakness sensations in the legs, and if she stands too long she feels as if she may collapse. The patient began having some numbness in both hands with the last 3 weeks. She does note some neck discomfort, but no severe pain in the neck or shoulders. She denies any weakness in arms. She denies headache, visual field changes, or dizziness. She believes that the numbness issues are gradually worsening over time. She is sent to this office for an evaluation.  Past Medical History  Diagnosis Date  . Hypertension   . Sinus drainage   . Constipation   . Sleeping difficulty   . H/O left breast biopsy   . High cholesterol   . Paresthesia 05/26/2016  . Gait disorder 05/26/2016    Past Surgical History  Procedure Laterality Date  . Joint replacement  1998    RT TOTAL HIP  . Cataracts removed    . Appendectomy      AGE 59  . Total hip arthroplasty  03/17/2012    Procedure: TOTAL HIP ARTHROPLASTY ANTERIOR APPROACH;  Surgeon: Shelda PalMatthew D Olin, MD;  Location: [facility];  Service: Orthopedics;  Laterality: Left;    Family History  Problem Relation Age of Onset  . Heart disease Brother   . Stroke Mother   . Cerebral aneurysm Sister     Social history:  reports that she quit smoking about 57 years ago. She does not have any  smokeless tobacco history on file. She reports that she does not drink alcohol or use illicit drugs.  Medications:  Prior to Admission medications   Medication Sig Start Date End Date Taking? Authorizing Provider  amLODipine (NORVASC) 5 MG tablet  05/21/16  Yes Historical Provider, MD  bumetanide (BUMEX) 1 MG tablet Take 1 mg by mouth daily after breakfast.   Yes Historical Provider, MD  diphenhydrAMINE (BENADRYL) 25 mg capsule Take 1 capsule (25 mg total) by mouth every 6 (six) hours as needed for itching, allergies or sleep. 03/18/12 03/28/12  Lanney GinsMatthew Babish, PA-C  ferrous sulfate 325 (65 FE) MG tablet Take 1 tablet (325 mg total) by mouth 3 (three) times daily after meals. 03/18/12 03/18/13  Lanney GinsMatthew Babish, PA-C  ibuprofen (ADVIL,MOTRIN) 200 MG tablet Take 200 mg by mouth every 6 (six) hours as needed.   Yes Historical Provider, MD  losartan (COZAAR) 100 MG tablet Take 50 mg by mouth 2 (two) times daily.   Yes Historical Provider, MD  omeprazole (PRILOSEC) 20 MG capsule  05/22/16  Yes Historical Provider, MD  rosuvastatin (CRESTOR) 20 MG tablet Take 20 mg by mouth daily after breakfast.   Yes Historical Provider, MD  traMADol (ULTRAM) 50 MG tablet  04/06/16  Yes Historical Provider, MD      Allergies  Allergen Reactions  . Codeine Other (See Comments)  Pass out   . Sulfonamide Derivatives Rash    ROS:  Out of a complete 14 system review of symptoms, the patient complains only of the following symptoms, and all other reviewed systems are negative.  Swelling in the legs Constipation Joint pain, muscle cramps, aching muscles Numbness Not enough sleep, decreased energy Sleepiness, restless legs  Blood pressure 167/87, pulse 98, height 4\' 11"  (1.499 m), weight 130 lb 8 oz (59.194 kg).  Physical Exam  General: The patient is alert and cooperative at the time of the examination.  Eyes: Pupils are equal, round, and reactive to light. Discs are flat bilaterally.  Neck: The neck is  supple, no carotid bruits are noted.  Respiratory: The respiratory examination is clear.  Cardiovascular: The cardiovascular examination reveals a regular rate and rhythm, no obvious murmurs or rubs are noted.  Skin: Extremities are with 1+ edema of ankles.  Neurologic Exam  Mental status: The patient is alert and oriented x 3 at the time of the examination. The patient has apparent normal recent and remote memory, with an apparently normal attention span and concentration ability.  Cranial nerves: Facial symmetry is present. There is good sensation of the face to pinprick and soft touch bilaterally. The strength of the facial muscles and the muscles to head turning and shoulder shrug are normal bilaterally. Speech is well enunciated, no aphasia or dysarthria is noted. Extraocular movements are full. Visual fields are full. The tongue is midline, and the patient has symmetric elevation of the soft palate. No obvious hearing deficits are noted.  Motor: The motor testing reveals 5 over 5 strength of all 4 extremities. Good symmetric motor tone is noted throughout.  Sensory: Sensory testing is intact to pinprick, soft touch, vibration sensation, and position sense on the upper extremities. No definite stocking pattern pinprick sensory deficit is noted in the lower extremities. Position sense is decreased in both feet, vibration sensation is slightly decreased on the right foot, more normal on the left. No evidence of extinction is noted.  Coordination: Cerebellar testing reveals good finger-nose-finger and heel-to-shin bilaterally.  Gait and station: Gait is slightly wide-based, unstable, the patient walks with a cane. The patient is unable to perform tandem gait. Romberg is negative, but is unsteady. No drift is seen.  Reflexes: Deep tendon reflexes are symmetric and normal bilaterally, but are decreased with the ankle jerks. Toes are downgoing bilaterally.   Assessment/Plan:  1. Back pain,  bilateral leg discomfort  2. Numbness all 4 extremities  3. Gait instability  The patient has developed some significant sensory alteration in the legs primarily, with significant back pain and burning sensations down the legs. She will be set up for MRI of the low back. The patient will have blood work done today. If the above studies are unremarkable, we will consider physical therapy in the home environment, and consider nerve conduction study and EMG evaluation. She will follow-up following the above evaluation.   Marlan Palau. Keith Willis MD 05/26/2016 3:41 PM  [facility] [address] [place], [place] [address]  Phone [phone] Fax [phone]

## 2016-05-27 DIAGNOSIS — H10413 Chronic giant papillary conjunctivitis, bilateral: Secondary | ICD-10-CM | POA: Diagnosis not present

## 2016-05-27 DIAGNOSIS — H04123 Dry eye syndrome of bilateral lacrimal glands: Secondary | ICD-10-CM | POA: Diagnosis not present

## 2016-05-27 DIAGNOSIS — H43813 Vitreous degeneration, bilateral: Secondary | ICD-10-CM | POA: Diagnosis not present

## 2016-05-27 DIAGNOSIS — Z961 Presence of intraocular lens: Secondary | ICD-10-CM | POA: Diagnosis not present

## 2016-05-27 DIAGNOSIS — H35372 Puckering of macula, left eye: Secondary | ICD-10-CM | POA: Diagnosis not present

## 2016-05-28 ENCOUNTER — Telehealth: Payer: Self-pay

## 2016-05-28 LAB — RPR: RPR Ser Ql: NONREACTIVE

## 2016-05-28 LAB — VITAMIN B12: Vitamin B-12: 437 pg/mL (ref 211–946)

## 2016-05-28 LAB — B. BURGDORFI ANTIBODIES

## 2016-05-28 LAB — COPPER, SERUM: COPPER: 123 ug/dL (ref 72–166)

## 2016-05-28 NOTE — Telephone Encounter (Signed)
-----   Message from York Spanielharles K Willis, MD sent at 05/28/2016  7:17 AM EDT -----  The blood work results are unremarkable. Please call the patient.   ----- Message -----    From: [facility] Lab Results In Interface    Sent: 05/27/2016   7:43 AM      To: York Spanielharles K Willis, MD

## 2016-05-28 NOTE — Telephone Encounter (Signed)
Called pt w/ unremarkable lab results. Spoke to daughter, Karoline Caldwellngie. Verbalized understanding and appreciation for call.

## 2016-06-08 ENCOUNTER — Telehealth: Payer: Self-pay | Admitting: Neurology

## 2016-06-08 NOTE — Telephone Encounter (Signed)
Patient's daughter is calling about scheduling her mother for an MRI.  She states you were checking to see if the insurance would pay.  Please call.

## 2016-06-08 NOTE — Telephone Encounter (Signed)
Spoke with the patients daughter who stated they would like to have the [facility] done [facility] I informed her that she could call to schedule it with them and the auth had been taken care of.

## 2016-06-18 ENCOUNTER — Ambulatory Visit (HOSPITAL_COMMUNITY)
Admission: RE | Admit: 2016-06-18 | Discharge: 2016-06-18 | Disposition: A | Discharge status: Home / Self Care | Payer: Medicare Other | Source: Ambulatory Visit | Attending: Neurology | Admitting: Neurology

## 2016-06-18 ENCOUNTER — Telehealth: Payer: Self-pay | Admitting: Neurology

## 2016-06-18 DIAGNOSIS — R269 Unspecified abnormalities of gait and mobility: Secondary | ICD-10-CM | POA: Diagnosis not present

## 2016-06-18 DIAGNOSIS — M4806 Spinal stenosis, lumbar region: Secondary | ICD-10-CM | POA: Insufficient documentation

## 2016-06-18 DIAGNOSIS — R202 Paresthesia of skin: Secondary | ICD-10-CM | POA: Diagnosis not present

## 2016-06-18 DIAGNOSIS — M48061 Spinal stenosis, lumbar region without neurogenic claudication: Secondary | ICD-10-CM

## 2016-06-18 DIAGNOSIS — M47896 Other spondylosis, lumbar region: Secondary | ICD-10-CM | POA: Diagnosis not present

## 2016-06-18 NOTE — Telephone Encounter (Signed)
I called patient. The patient has severe spinal stenosis at the L3-4 and L4-5 levels, given her advanced age, I'm not sure that surgery would be contemplated. We discussed getting an opinion through one of the neurosurgeons, I'll try to get this set up. This likely does explain the sensory alteration in the groin and legs, and sensation of weakness in the legs.   MRI lumbar 06/18/16:  IMPRESSION: 1. Lumbar spine spondylosis as described above. 2. At L3-4 there is a moderate broad-based disc bulge. Severe bilateral facet arthropathy with ligamentum flavum infolding resulting in severe spinal stenosis. Mild right foraminal stenosis. 3. At L4-5 there is a moderate broad-based disc bulge with severe bilateral facet arthropathy and ligamentum flavum infolding resulting in severe spinal stenosis. Mild right foraminal stenosis. 4. At L2-3 there is a mild broad-based disc bulge. Moderate bilateral facet arthropathy. Bilateral lateral recess narrowing and moderate spinal stenosis.

## 2016-07-07 ENCOUNTER — Other Ambulatory Visit: Payer: Self-pay | Admitting: Neurosurgery

## 2016-07-07 DIAGNOSIS — I10 Essential (primary) hypertension: Secondary | ICD-10-CM | POA: Diagnosis not present

## 2016-07-07 DIAGNOSIS — M4806 Spinal stenosis, lumbar region: Secondary | ICD-10-CM | POA: Diagnosis not present

## 2016-07-07 DIAGNOSIS — Z6827 Body mass index (BMI) 27.0-27.9, adult: Secondary | ICD-10-CM | POA: Diagnosis not present

## 2016-07-20 ENCOUNTER — Encounter (HOSPITAL_COMMUNITY)
Admission: RE | Admit: 2016-07-20 | Discharge: 2016-07-20 | Disposition: A | Discharge status: Home / Self Care | Payer: Medicare Other | Source: Ambulatory Visit | Attending: Neurosurgery | Admitting: Neurosurgery

## 2016-07-20 ENCOUNTER — Encounter (HOSPITAL_COMMUNITY): Payer: Self-pay

## 2016-07-20 DIAGNOSIS — I4510 Unspecified right bundle-branch block: Secondary | ICD-10-CM | POA: Diagnosis not present

## 2016-07-20 DIAGNOSIS — Z0183 Encounter for blood typing: Secondary | ICD-10-CM | POA: Diagnosis not present

## 2016-07-20 DIAGNOSIS — M4806 Spinal stenosis, lumbar region: Secondary | ICD-10-CM | POA: Insufficient documentation

## 2016-07-20 DIAGNOSIS — I10 Essential (primary) hypertension: Secondary | ICD-10-CM | POA: Insufficient documentation

## 2016-07-20 DIAGNOSIS — Z01818 Encounter for other preprocedural examination: Secondary | ICD-10-CM | POA: Diagnosis not present

## 2016-07-20 DIAGNOSIS — R9431 Abnormal electrocardiogram [ECG] [EKG]: Secondary | ICD-10-CM | POA: Insufficient documentation

## 2016-07-20 DIAGNOSIS — Z01812 Encounter for preprocedural laboratory examination: Secondary | ICD-10-CM | POA: Insufficient documentation

## 2016-07-20 HISTORY — DX: Encounter for fitting and adjustment of other specified devices: Z46.89

## 2016-07-20 HISTORY — DX: Unspecified osteoarthritis, unspecified site: M19.90

## 2016-07-20 LAB — BASIC METABOLIC PANEL
ANION GAP: 9 (ref 5–15)
BUN: 25 mg/dL — ABNORMAL HIGH (ref 6–20)
CHLORIDE: 108 mmol/L (ref 101–111)
CO2: 23 mmol/L (ref 22–32)
Calcium: 9.5 mg/dL (ref 8.9–10.3)
Creatinine, Ser: 1.16 mg/dL — ABNORMAL HIGH (ref 0.44–1.00)
GFR calc Af Amer: 48 mL/min — ABNORMAL LOW (ref >60)
GFR, EST NON AFRICAN AMERICAN: 41 mL/min — AB (ref >60)
GLUCOSE: 96 mg/dL (ref 65–99)
POTASSIUM: 4.3 mmol/L (ref 3.5–5.1)
Sodium: 140 mmol/L (ref 135–145)

## 2016-07-20 LAB — TYPE AND SCREEN
ABO/RH(D): A POS
ANTIBODY SCREEN: NEGATIVE

## 2016-07-20 LAB — SURGICAL PCR SCREEN
MRSA, PCR: NEGATIVE
Staphylococcus aureus: NEGATIVE

## 2016-07-20 LAB — CBC
HEMATOCRIT: 35.5 % — AB (ref 36.0–46.0)
HEMOGLOBIN: 11.3 g/dL — AB (ref 12.0–15.0)
MCH: 31.1 pg (ref 26.0–34.0)
MCHC: 31.8 g/dL (ref 30.0–36.0)
MCV: 97.8 fL (ref 78.0–100.0)
PLATELETS: 165 10*3/uL (ref 150–400)
RBC: 3.63 MIL/uL — AB (ref 3.87–5.11)
RDW: 13.5 % (ref 11.5–15.5)
WBC: 5.7 10*3/uL (ref 4.0–10.5)

## 2016-07-20 LAB — ABO/RH: ABO/RH(D): A POS

## 2016-07-20 MED ORDER — CHLORHEXIDINE GLUCONATE CLOTH 2 % EX PADS: 6.0000 | MEDICATED_PAD | Freq: Once | CUTANEOUS | Status: DC

## 2016-07-20 NOTE — Pre-Procedure Instructions (Signed)
    Carrie RoyalBetty W Sanders  07/20/2016      [id number] - [place], [place] - [id number] [place] [id number] Phone: [phone] Fax: [phone]    Your procedure is scheduled on 07/24/16.  [facility] Ad[facility] at 630 A.M.  Call this number if you have problems the morning of surgery:  [phone]   Remember:  Do not eat food or drink liquids after midnight.  Take these medicines the morning of surgery with A SIP OF WATER ---norvasc,neurontin,prilosec   Do not wear jewelry, make-up or nail polish.  Do not wear lotions, powders, or perfumes.  You may wear deoderant.  Do not shave 48 hours prior to surgery.  Men may shave face and neck.  Do not bring valuables to the hospital.  [facility] is not responsible for any belongings or valuables.  Contacts, dentures or bridgework may not be worn into surgery.  Leave your suitcase in the car.  After surgery it may be brought to your room.  For patients admitted to the hospital, discharge time will be determined by your treatment team.  Patients discharged the day of surgery will not be allowed to drive home.   Name and phone number of your driver:    Special instructions:  Do not take any aspirin,anti-inflammatories,vitamins,or herbal supplements 5-7 days prior to surgery.  Please read over the following fact sheets that you were given. MRSA Information

## 2016-07-23 NOTE — Anesthesia Preprocedure Evaluation (Addendum)
Anesthesia Evaluation  Patient identified by MRN, date of birth, ID band Patient awake    Reviewed: Allergy & Precautions, H&P , NPO status , Patient's Chart, lab work & pertinent test results, reviewed documented beta blocker date and time   Airway Mallampati: II  TM Distance: >3 FB Neck ROM: full    Dental no notable dental hx. (+) Teeth Intact, Dental Advisory Given, Caps   Pulmonary former smoker,    Pulmonary exam normal breath sounds clear to auscultation       Cardiovascular hypertension, Pt. on medications  Rhythm:regular Rate:Normal     Neuro/Psych  Neuromuscular disease    GI/Hepatic Neg liver ROS, GERD  Medicated and Controlled,  Endo/Other  negative endocrine ROS  Renal/GU      Musculoskeletal   Abdominal   Peds  Hematology   Anesthesia Other Findings   Reproductive/Obstetrics                            Anesthesia Physical Anesthesia Plan  ASA: II  Anesthesia Plan: General   Post-op Pain Management:    Induction: Intravenous  Airway Management Planned: Oral ETT  Additional Equipment:   Intra-op Plan:   Post-operative Plan: Extubation in OR  Informed Consent: I have reviewed the patients History and Physical, chart, labs and discussed the procedure including the risks, benefits and alternatives for the proposed anesthesia with the patient or authorized representative who has indicated his/her understanding and acceptance.   Dental Advisory Given and Dental advisory given  Plan Discussed with: CRNA and Surgeon  Anesthesia Plan Comments: (  Discussed general anesthesia, including possible nausea, instrumentation of airway, sore throat,pulmonary aspiration, etc. I asked if the were any outstanding questions, or  concerns before we proceeded. )        Anesthesia Quick Evaluation

## 2016-07-24 ENCOUNTER — Encounter (HOSPITAL_COMMUNITY): Payer: Self-pay | Admitting: *Deleted

## 2016-07-24 ENCOUNTER — Observation Stay (HOSPITAL_COMMUNITY)
Admission: RE | Admit: 2016-07-24 | Discharge: 2016-07-25 | Disposition: A | Discharge status: Home / Self Care | Payer: Medicare Other | Source: Ambulatory Visit | Attending: Neurosurgery | Admitting: Neurosurgery

## 2016-07-24 ENCOUNTER — Inpatient Hospital Stay (HOSPITAL_COMMUNITY): Payer: Medicare Other

## 2016-07-24 ENCOUNTER — Inpatient Hospital Stay (HOSPITAL_COMMUNITY): Payer: Medicare Other | Admitting: Anesthesiology

## 2016-07-24 ENCOUNTER — Encounter (HOSPITAL_COMMUNITY)
Admission: RE | Disposition: A | Discharge status: Home / Self Care | Payer: Self-pay | Source: Ambulatory Visit | Attending: Neurosurgery

## 2016-07-24 ENCOUNTER — Inpatient Hospital Stay (HOSPITAL_COMMUNITY): Payer: Medicare Other | Admitting: Emergency Medicine

## 2016-07-24 DIAGNOSIS — M4726 Other spondylosis with radiculopathy, lumbar region: Secondary | ICD-10-CM | POA: Diagnosis not present

## 2016-07-24 DIAGNOSIS — M4316 Spondylolisthesis, lumbar region: Secondary | ICD-10-CM | POA: Insufficient documentation

## 2016-07-24 DIAGNOSIS — M4806 Spinal stenosis, lumbar region: Secondary | ICD-10-CM | POA: Diagnosis not present

## 2016-07-24 DIAGNOSIS — M1990 Unspecified osteoarthritis, unspecified site: Secondary | ICD-10-CM | POA: Diagnosis not present

## 2016-07-24 DIAGNOSIS — M48061 Spinal stenosis, lumbar region without neurogenic claudication: Secondary | ICD-10-CM | POA: Diagnosis present

## 2016-07-24 DIAGNOSIS — Z87891 Personal history of nicotine dependence: Secondary | ICD-10-CM | POA: Insufficient documentation

## 2016-07-24 DIAGNOSIS — Z419 Encounter for procedure for purposes other than remedying health state, unspecified: Secondary | ICD-10-CM

## 2016-07-24 DIAGNOSIS — Z981 Arthrodesis status: Secondary | ICD-10-CM | POA: Diagnosis not present

## 2016-07-24 DIAGNOSIS — Z79899 Other long term (current) drug therapy: Secondary | ICD-10-CM | POA: Diagnosis not present

## 2016-07-24 DIAGNOSIS — K219 Gastro-esophageal reflux disease without esophagitis: Secondary | ICD-10-CM | POA: Diagnosis not present

## 2016-07-24 DIAGNOSIS — I10 Essential (primary) hypertension: Secondary | ICD-10-CM | POA: Diagnosis not present

## 2016-07-24 DIAGNOSIS — Z96643 Presence of artificial hip joint, bilateral: Secondary | ICD-10-CM | POA: Diagnosis not present

## 2016-07-24 DIAGNOSIS — E7800 Pure hypercholesterolemia, unspecified: Secondary | ICD-10-CM | POA: Diagnosis not present

## 2016-07-24 SURGERY — Surgical Case
Anesthesia: *Unknown

## 2016-07-24 SURGERY — POSTERIOR LUMBAR FUSION 1 LEVEL
Anesthesia: General | Site: Back

## 2016-07-24 MED ORDER — LACTATED RINGERS IV SOLN
INTRAVENOUS | Status: DC
  Administered 2016-07-24 (×2): via INTRAVENOUS

## 2016-07-24 MED ORDER — THROMBIN 20000 UNITS EX SOLR
CUTANEOUS | Status: DC | PRN
  Administered 2016-07-24: 08:00:00 via TOPICAL

## 2016-07-24 MED ORDER — SENNOSIDES-DOCUSATE SODIUM 8.6-50 MG PO TABS: 1.0000 | ORAL_TABLET | Freq: Every evening | ORAL | Status: DC | PRN

## 2016-07-24 MED ORDER — FENTANYL CITRATE (PF) 100 MCG/2ML IJ SOLN
INTRAMUSCULAR | Status: AC
  Filled 2016-07-24: qty 4

## 2016-07-24 MED ORDER — SODIUM CHLORIDE 0.9% FLUSH: 3.0000 mL | Freq: Two times a day (BID) | INTRAVENOUS | Status: DC

## 2016-07-24 MED ORDER — GABAPENTIN 100 MG PO CAPS
100.0000 mg | ORAL_CAPSULE | Freq: Three times a day (TID) | ORAL | Status: DC
  Administered 2016-07-24 (×2): 100 mg via ORAL
  Filled 2016-07-24 (×2): qty 1

## 2016-07-24 MED ORDER — MENTHOL 3 MG MT LOZG: 1.0000 | LOZENGE | OROMUCOSAL | Status: DC | PRN

## 2016-07-24 MED ORDER — BUPIVACAINE HCL 0.25 % IJ SOLN
INTRAMUSCULAR | Status: DC | PRN
  Administered 2016-07-24: 10 mL

## 2016-07-24 MED ORDER — FENTANYL CITRATE (PF) 100 MCG/2ML IJ SOLN: 25.0000 ug | INTRAMUSCULAR | Status: DC | PRN

## 2016-07-24 MED ORDER — SODIUM CHLORIDE 0.9 % IV SOLN: 250.0000 mL | INTRAVENOUS | Status: DC

## 2016-07-24 MED ORDER — ROSUVASTATIN CALCIUM 20 MG PO TABS: 20.0000 mg | ORAL_TABLET | Freq: Every day | ORAL | Status: DC

## 2016-07-24 MED ORDER — ONDANSETRON HCL 4 MG/2ML IJ SOLN: 4.0000 mg | INTRAMUSCULAR | Status: DC | PRN

## 2016-07-24 MED ORDER — IBUPROFEN 200 MG PO TABS: 200.0000 mg | ORAL_TABLET | Freq: Four times a day (QID) | ORAL | Status: DC | PRN

## 2016-07-24 MED ORDER — PANTOPRAZOLE SODIUM 40 MG PO TBEC
40.0000 mg | DELAYED_RELEASE_TABLET | Freq: Every day | ORAL | Status: DC
Start: 2016-07-25 — End: 2016-07-25

## 2016-07-24 MED ORDER — PHENYLEPHRINE 40 MCG/ML (10ML) SYRINGE FOR IV PUSH (FOR BLOOD PRESSURE SUPPORT)
PREFILLED_SYRINGE | INTRAVENOUS | Status: AC
  Filled 2016-07-24: qty 10

## 2016-07-24 MED ORDER — SODIUM CHLORIDE 0.9 % IR SOLN
Status: DC | PRN
  Administered 2016-07-24: 08:00:00

## 2016-07-24 MED ORDER — DEXAMETHASONE SODIUM PHOSPHATE 10 MG/ML IJ SOLN
10.0000 mg | INTRAMUSCULAR | Status: AC
  Administered 2016-07-24: 10 mg via INTRAVENOUS
  Filled 2016-07-24: qty 1

## 2016-07-24 MED ORDER — PROPOFOL 10 MG/ML IV BOLUS
INTRAVENOUS | Status: DC | PRN
  Administered 2016-07-24: 60 mg via INTRAVENOUS

## 2016-07-24 MED ORDER — PHENYLEPHRINE HCL 10 MG/ML IJ SOLN
INTRAMUSCULAR | Status: DC | PRN
  Administered 2016-07-24: 40 ug via INTRAVENOUS
  Administered 2016-07-24: 120 ug via INTRAVENOUS
  Administered 2016-07-24: 80 ug via INTRAVENOUS
  Administered 2016-07-24: 120 ug via INTRAVENOUS
  Administered 2016-07-24: 80 ug via INTRAVENOUS

## 2016-07-24 MED ORDER — GELATIN ABSORBABLE MT POWD
OROMUCOSAL | Status: DC | PRN
  Administered 2016-07-24: 10:00:00 via TOPICAL

## 2016-07-24 MED ORDER — DOCUSATE SODIUM 100 MG PO CAPS
100.0000 mg | ORAL_CAPSULE | Freq: Two times a day (BID) | ORAL | Status: DC
  Administered 2016-07-24 (×2): 100 mg via ORAL
  Filled 2016-07-24 (×2): qty 1

## 2016-07-24 MED ORDER — ACETAMINOPHEN 650 MG RE SUPP: 650.0000 mg | RECTAL | Status: DC | PRN

## 2016-07-24 MED ORDER — SUGAMMADEX SODIUM 200 MG/2ML IV SOLN
INTRAVENOUS | Status: AC
  Filled 2016-07-24: qty 2

## 2016-07-24 MED ORDER — LOSARTAN POTASSIUM 50 MG PO TABS
50.0000 mg | ORAL_TABLET | Freq: Two times a day (BID) | ORAL | Status: DC
  Administered 2016-07-24: 50 mg via ORAL
  Filled 2016-07-24: qty 1

## 2016-07-24 MED ORDER — ACETAMINOPHEN 325 MG PO TABS: 650.0000 mg | ORAL_TABLET | ORAL | Status: DC | PRN

## 2016-07-24 MED ORDER — OXYCODONE HCL 5 MG PO TABS: 5.0000 mg | ORAL_TABLET | Freq: Four times a day (QID) | ORAL | 0 refills | Status: DC | PRN

## 2016-07-24 MED ORDER — LACTATED RINGERS IV SOLN: INTRAVENOUS | Status: DC

## 2016-07-24 MED ORDER — 0.9 % SODIUM CHLORIDE (POUR BTL) OPTIME
TOPICAL | Status: DC | PRN
  Administered 2016-07-24: 1000 mL

## 2016-07-24 MED ORDER — ONDANSETRON HCL 4 MG/2ML IJ SOLN
INTRAMUSCULAR | Status: AC
  Filled 2016-07-24: qty 2

## 2016-07-24 MED ORDER — AMLODIPINE BESYLATE 5 MG PO TABS
5.0000 mg | ORAL_TABLET | Freq: Every day | ORAL | Status: DC
  Filled 2016-07-24: qty 1

## 2016-07-24 MED ORDER — HYDROCODONE-ACETAMINOPHEN 5-325 MG PO TABS
1.0000 | ORAL_TABLET | ORAL | Status: DC | PRN
  Administered 2016-07-24: 2 via ORAL
  Filled 2016-07-24: qty 2

## 2016-07-24 MED ORDER — SODIUM CHLORIDE 0.9% FLUSH: 3.0000 mL | INTRAVENOUS | Status: DC | PRN

## 2016-07-24 MED ORDER — BUMETANIDE 1 MG PO TABS
1.0000 mg | ORAL_TABLET | Freq: Every day | ORAL | Status: DC
  Filled 2016-07-24: qty 1

## 2016-07-24 MED ORDER — SUGAMMADEX SODIUM 200 MG/2ML IV SOLN
INTRAVENOUS | Status: DC | PRN
  Administered 2016-07-24: 125 mg via INTRAVENOUS

## 2016-07-24 MED ORDER — LIDOCAINE-EPINEPHRINE 1 %-1:100000 IJ SOLN
INTRAMUSCULAR | Status: DC | PRN
  Administered 2016-07-24: 8 mL

## 2016-07-24 MED ORDER — CEFAZOLIN IN D5W 1 GM/50ML IV SOLN
1.0000 g | Freq: Three times a day (TID) | INTRAVENOUS | Status: AC
  Administered 2016-07-24 – 2016-07-25 (×2): 1 g via INTRAVENOUS
  Filled 2016-07-24 (×2): qty 50

## 2016-07-24 MED ORDER — HYDROMORPHONE HCL 1 MG/ML IJ SOLN: 0.5000 mg | INTRAMUSCULAR | Status: DC | PRN

## 2016-07-24 MED ORDER — ONDANSETRON HCL 4 MG/2ML IJ SOLN
INTRAMUSCULAR | Status: DC | PRN
  Administered 2016-07-24: 4 mg via INTRAVENOUS

## 2016-07-24 MED ORDER — LIDOCAINE HCL (CARDIAC) 20 MG/ML IV SOLN
INTRAVENOUS | Status: DC | PRN
  Administered 2016-07-24: 100 mg via INTRAVENOUS

## 2016-07-24 MED ORDER — PHENOL 1.4 % MT LIQD: 1.0000 | OROMUCOSAL | Status: DC | PRN

## 2016-07-24 MED ORDER — PHENYLEPHRINE HCL 10 MG/ML IJ SOLN
INTRAMUSCULAR | Status: DC | PRN
  Administered 2016-07-24: 20 ug/min via INTRAVENOUS

## 2016-07-24 MED ORDER — FENTANYL CITRATE (PF) 100 MCG/2ML IJ SOLN
INTRAMUSCULAR | Status: DC | PRN
  Administered 2016-07-24: 50 ug via INTRAVENOUS
  Administered 2016-07-24: 100 ug via INTRAVENOUS

## 2016-07-24 MED ORDER — CEFAZOLIN SODIUM-DEXTROSE 2-4 GM/100ML-% IV SOLN
2.0000 g | INTRAVENOUS | Status: AC
  Administered 2016-07-24: 2 g via INTRAVENOUS
  Filled 2016-07-24: qty 100

## 2016-07-24 MED ORDER — PROPOFOL 10 MG/ML IV BOLUS
INTRAVENOUS | Status: AC
  Filled 2016-07-24: qty 20

## 2016-07-24 MED ORDER — ROCURONIUM BROMIDE 100 MG/10ML IV SOLN
INTRAVENOUS | Status: DC | PRN
  Administered 2016-07-24: 10 mg via INTRAVENOUS
  Administered 2016-07-24: 50 mg via INTRAVENOUS

## 2016-07-24 MED ORDER — ACETAMINOPHEN 160 MG/5ML PO SOLN
650.0000 mg | Freq: Once | ORAL | Status: AC
  Administered 2016-07-24: 650 mg via ORAL
  Filled 2016-07-24: qty 20.3

## 2016-07-24 MED ORDER — ROCURONIUM BROMIDE 10 MG/ML (PF) SYRINGE
PREFILLED_SYRINGE | INTRAVENOUS | Status: AC
  Filled 2016-07-24: qty 10

## 2016-07-24 MED ORDER — LIDOCAINE 2% (20 MG/ML) 5 ML SYRINGE
INTRAMUSCULAR | Status: AC
  Filled 2016-07-24: qty 5

## 2016-07-24 SURGICAL SUPPLY — 59 items
APL SKNCLS STERI-STRIP NONHPOA (GAUZE/BANDAGES/DRESSINGS) ×1
BAG DECANTER FOR FLEXI CONT (MISCELLANEOUS) ×3 IMPLANT
BENZOIN TINCTURE PRP APPL 2/3 (GAUZE/BANDAGES/DRESSINGS) ×3 IMPLANT
BLADE CLIPPER SURG (BLADE) IMPLANT
BLADE SURG 11 STRL SS (BLADE) ×3 IMPLANT
BUR MATCHSTICK NEURO 3.0 LAGG (BURR) ×3 IMPLANT
BUR PRECISION FLUTE 6.0 (BURR) ×3 IMPLANT
CANISTER SUCT 3000ML PPV (MISCELLANEOUS) ×3 IMPLANT
CLOSURE WOUND 1/2 X4 (GAUZE/BANDAGES/DRESSINGS) ×1
CONT SPEC 4OZ CLIKSEAL STRL BL (MISCELLANEOUS) ×3 IMPLANT
COVER BACK TABLE 60X90IN (DRAPES) ×3 IMPLANT
DECANTER SPIKE VIAL GLASS SM (MISCELLANEOUS) ×3 IMPLANT
DRAPE C-ARM 42X72 X-RAY (DRAPES) ×3 IMPLANT
DRAPE C-ARMOR (DRAPES) ×3 IMPLANT
DRAPE HALF SHEET 40X57 (DRAPES) IMPLANT
DRAPE LAPAROTOMY 100X72X124 (DRAPES) ×3 IMPLANT
DRAPE POUCH INSTRU U-SHP 10X18 (DRAPES) ×3 IMPLANT
DRAPE SURG 17X23 STRL (DRAPES) ×3 IMPLANT
DRSG OPSITE POSTOP 4X6 (GAUZE/BANDAGES/DRESSINGS) ×2 IMPLANT
DURAPREP 26ML APPLICATOR (WOUND CARE) ×3 IMPLANT
ELECT REM PT RETURN 9FT ADLT (ELECTROSURGICAL) ×3
ELECTRODE REM PT RTRN 9FT ADLT (ELECTROSURGICAL) ×1 IMPLANT
EVACUATOR 3/16  PVC DRAIN (DRAIN) ×2
EVACUATOR 3/16 PVC DRAIN (DRAIN) ×1 IMPLANT
GAUZE SPONGE 4X4 12PLY STRL (GAUZE/BANDAGES/DRESSINGS) ×3 IMPLANT
GAUZE SPONGE 4X4 16PLY XRAY LF (GAUZE/BANDAGES/DRESSINGS) IMPLANT
GLOVE BIO SURGEON STRL SZ8 (GLOVE) ×6 IMPLANT
GLOVE ECLIPSE 7.5 STRL STRAW (GLOVE) ×6 IMPLANT
GLOVE EXAM NITRILE LRG STRL (GLOVE) IMPLANT
GLOVE EXAM NITRILE XL STR (GLOVE) IMPLANT
GLOVE EXAM NITRILE XS STR PU (GLOVE) IMPLANT
GLOVE INDICATOR 7.5 STRL GRN (GLOVE) ×2 IMPLANT
GLOVE INDICATOR 8.0 STRL GRN (GLOVE) ×4 IMPLANT
GLOVE INDICATOR 8.5 STRL (GLOVE) ×6 IMPLANT
GOWN STRL REUS W/ TWL LRG LVL3 (GOWN DISPOSABLE) IMPLANT
GOWN STRL REUS W/ TWL XL LVL3 (GOWN DISPOSABLE) ×2 IMPLANT
GOWN STRL REUS W/TWL 2XL LVL3 (GOWN DISPOSABLE) IMPLANT
GOWN STRL REUS W/TWL LRG LVL3 (GOWN DISPOSABLE)
GOWN STRL REUS W/TWL XL LVL3 (GOWN DISPOSABLE) ×6
HEMOSTAT POWDER SURGIFOAM 1G (HEMOSTASIS) ×2 IMPLANT
KIT BASIN OR (CUSTOM PROCEDURE TRAY) ×3 IMPLANT
KIT ROOM TURNOVER OR (KITS) ×3 IMPLANT
LIQUID BAND (GAUZE/BANDAGES/DRESSINGS) ×3 IMPLANT
NDL HYPO 25X1 1.5 SAFETY (NEEDLE) ×1 IMPLANT
NEEDLE HYPO 25X1 1.5 SAFETY (NEEDLE) ×3 IMPLANT
NS IRRIG 1000ML POUR BTL (IV SOLUTION) ×3 IMPLANT
PACK LAMINECTOMY NEURO (CUSTOM PROCEDURE TRAY) ×3 IMPLANT
PAD ARMBOARD 7.5X6 YLW CONV (MISCELLANEOUS) ×9 IMPLANT
SPONGE LAP 4X18 X RAY DECT (DISPOSABLE) IMPLANT
SPONGE SURGIFOAM ABS GEL 100 (HEMOSTASIS) ×3 IMPLANT
STRIP CLOSURE SKIN 1/2X4 (GAUZE/BANDAGES/DRESSINGS) ×3 IMPLANT
SUT VIC AB 0 CT1 18XCR BRD8 (SUTURE) ×2 IMPLANT
SUT VIC AB 0 CT1 8-18 (SUTURE) ×6
SUT VIC AB 2-0 CT1 18 (SUTURE) ×3 IMPLANT
SUT VIC AB 4-0 PS2 27 (SUTURE) ×3 IMPLANT
TOWEL OR 17X24 6PK STRL BLUE (TOWEL DISPOSABLE) ×3 IMPLANT
TOWEL OR 17X26 10 PK STRL BLUE (TOWEL DISPOSABLE) ×3 IMPLANT
TRAY FOLEY W/METER SILVER 16FR (SET/KITS/TRAYS/PACK) ×3 IMPLANT
WATER STERILE IRR 1000ML POUR (IV SOLUTION) ×3 IMPLANT

## 2016-07-24 NOTE — Anesthesia Procedure Notes (Signed)
Procedure Name: Intubation Date/Time: 07/24/2016 9:37 AM Performed by: Lovie CholOCK, Fahim Kats K Pre-anesthesia Checklist: Patient identified, Emergency Drugs available, Suction available and Patient being monitored Patient Re-evaluated:Patient Re-evaluated prior to inductionOxygen Delivery Method: Circle System Utilized Preoxygenation: Pre-oxygenation with 100% oxygen Intubation Type: IV induction Ventilation: Mask ventilation without difficulty Grade View: Grade I Tube type: Oral Tube size: 7.0 mm Number of attempts: 1 Airway Equipment and Method: Stylet and Oral airway Placement Confirmation: ETT inserted through vocal cords under direct vision,  positive ETCO2 and breath sounds checked- equal and bilateral Secured at: 21 cm Tube secured with: Tape Dental Injury: Teeth and Oropharynx as per pre-operative assessment

## 2016-07-24 NOTE — Anesthesia Postprocedure Evaluation (Signed)
Anesthesia Post Note  Patient: Carrie Sanders  Procedure(s) Performed: Procedure(s) (LRB): Laminectomy and Foraminotomy - Lumbar three-four lumbar four-five bilateral (N/A)  Patient location during evaluation: PACU Anesthesia Type: General Level of consciousness: sedated Pain management: satisfactory to patient Vital Signs Assessment: post-procedure vital signs reviewed and stable Respiratory status: spontaneous breathing Cardiovascular status: stable Anesthetic complications: no    Last Vitals:  Vitals:   07/24/16 1204 07/24/16 1300  BP: 116/65 120/76  Pulse: 91 (!) 104  Resp: (!) 27 20  Temp:  36.7 C    Last Pain:  Vitals:   07/24/16 0739  TempSrc:   PainSc: 5                  Damacio Weisgerber EDWARD

## 2016-07-24 NOTE — Transfer of Care (Signed)
Immediate Anesthesia Transfer of Care Note  Patient: Carrie RoyalBetty W Sanders  Procedure(s) Performed: Procedure(s): Laminectomy and Foraminotomy - Lumbar three-four lumbar four-five bilateral (N/A)  Patient Location: PACU  Anesthesia Type:General  Level of Consciousness: awake, oriented and patient cooperative  Airway & Oxygen Therapy: Patient Spontanous Breathing and Patient connected to face mask oxygen  Post-op Assessment: Report given to RN and Post -op Vital signs reviewed and stable  Post vital signs: Reviewed and stable  Last Vitals:  Vitals:   07/24/16 0700 07/24/16 1101  BP: (!) 143/84   Pulse: 95 82  Resp: 18   Temp: 36.6 C 36.6 C    Last Pain:  Vitals:   07/24/16 0739  TempSrc:   PainSc: 5       Patients Stated Pain Goal: 3 (07/24/16 0739)  Complications: No apparent anesthesia complications

## 2016-07-24 NOTE — Evaluation (Signed)
Physical Therapy Evaluation Patient Details Name: Carrie Sanders MRN: [id number] DOB: [birth year] Today's Date: 07/24/2016   History of Present Illness  Pt is an 82 y.o. female s/p Laminectomy and Foraminotomy - Lumbar three-four lumbar four-five bilateral. PMHx: Arthritis, High cholesterol, HTN, Paresthesia, Bil THA.   Clinical Impression  Patient seen for mobility assessment and education s/p spinal surgery. Patient mobilizing well, performed ambulation and stair negotiation. Patient educated on precautions with hand out provided. No further acute PT needs at this time. Will need RW for discharge.    Follow Up Recommendations Supervision for mobility/OOB    Equipment Recommendations  Rolling walker with 5" wheels    Recommendations for Other Services       Precautions / Restrictions Precautions Precautions: Fall;Back Precaution Booklet Issued: Yes (comment) Precaution Comments: Educated pt on back precautions. Restrictions Weight Bearing Restrictions: No      Mobility  Bed Mobility Overal bed mobility: Needs Assistance Bed Mobility: Rolling;Sidelying to Sit Rolling: Supervision Sidelying to sit: Supervision       General bed mobility comments: no physical assist required, supervision for safety. Cues for technique, increased time to perform  Transfers Overall transfer level: Needs assistance Equipment used: Rolling walker (2 wheeled) Transfers: Sit to/from Stand Sit to Stand: Supervision         General transfer comment: VCs for hand placement  Ambulation/Gait Ambulation/Gait assistance: Supervision Ambulation Distance (Feet): 100 Feet Assistive device: Rolling walker (2 wheeled) Gait Pattern/deviations: Step-through pattern;Decreased stride length;Drifts right/left Gait velocity: decreased Gait velocity interpretation: Below normal speed for age/gender General Gait Details: steady with ambulation, intermittent cues for positioning within  RW  Stairs Stairs: Yes Stairs assistance: Min assist Stair Management: One rail Left Number of Stairs: 3 General stair comments: increased time to perform, pt with min assist for stability  Wheelchair Mobility    Modified Rankin (Stroke Patients Only)       Balance Overall balance assessment: Needs assistance   Sitting balance-Leahy Scale: Good     Standing balance support: Bilateral upper extremity supported Standing balance-Leahy Scale: Fair Standing balance comment: use of RW but able to static stand without device                             Pertinent Vitals/Pain      Home Living Family/patient expects to be discharged to:: Private residence Living Arrangements: Spouse/significant other Available Help at Discharge: Family;Available 24 hours/day Type of Home: House Home Access: Stairs to enter   [employer] of Steps: 2 Home Layout: One level Home Equipment: Shower seat;Adaptive equipment Additional Comments: Pts husband has dementa and uses a RW. Daughter lives next door and will be available to assist as needed.    Prior Function Level of Independence: Independent               Hand Dominance   Dominant Hand: Right    Extremity/Trunk Assessment               Lower Extremity Assessment: Generalized weakness (senory deficits BLE reports but improved since sx)      Cervical / Trunk Assessment: Other exceptions  Communication   Communication: No difficulties  Cognition Arousal/Alertness: Awake/alert Behavior During Therapy: WFL for tasks assessed/performed Overall Cognitive Status: Within Functional Limits for tasks assessed                      General Comments      Exercises  Assessment/Plan    PT Assessment Patent does not need any further PT services  PT Diagnosis Difficulty walking;Abnormality of gait;Generalized weakness;Acute pain   PT Problem List    PT Treatment Interventions      PT Goals (Current goals can be found in the Care Plan section) Acute Rehab PT Goals PT Goal Formulation: All assessment and education complete, DC therapy    Frequency     Barriers to discharge        Co-evaluation               End of Session Equipment Utilized During Treatment: Gait belt Activity Tolerance: Patient tolerated treatment well Patient left: Other (comment) (with OT therapist) Nurse Communication: Mobility status    Functional Assessment Tool Used: clinical judgement Functional Limitation: Mobility: Walking and moving around Mobility: Walking and Moving Around Current Status ([id number](G8978): At least 1 percent but less than 20 percent impaired, limited or restricted Mobility: Walking and Moving Around Goal Status [phone](G8979): At least 1 percent but less than 20 percent impaired, limited or restricted Mobility: Walking and Moving Around Discharge Status [phone](G8980): At least 1 percent but less than 20 percent impaired, limited or restricted    Time: [id number] PT Time Calculation (min) (ACUTE ONLY): 14 min   Charges:   PT Evaluation $PT Eval Low Complexity: 1 Procedure     PT G Codes:   PT G-Codes **NOT FOR INPATIENT CLASS** Functional Assessment Tool Used: clinical judgement Functional Limitation: Mobility: Walking and moving around Mobility: Walking and Moving Around Current Status ([id number](G8978): At least 1 percent but less than 20 percent impaired, limited or restricted Mobility: Walking and Moving Around Goal Status [phone]([id number]): At least 1 percent but less than 20 percent impaired, limited or restricted Mobility: Walking and Moving Around Discharge Status [phone](G8980): At least 1 percent but less than 20 percent impaired, limited or restricted    Carrie Sanders, Carrie Sanders 07/24/2016, 4:50 PM Carrie Sanders  [phone]

## 2016-07-24 NOTE — H&P (Signed)
Carrie Sanders is an 82 y.o. female.   Chief Complaint: Back and bilateral leg pain and numbness HPI: Patient is a 82-year-old female whose had numbness tingling weakness in both legs and difficulty walking. Workup has revealed severe spinal stenosis at L3-4 and L4-5. She also has a listhesis that seems to be stable on dynamic x-rays. Due to her age I recommended spinal decompression only I've extensively gone over the risks and benefits of that operation with her as well as perioperative course expectations of outcome alternatives of surgery including the possibility of this having to convert to her fusion. She understands all this and agreed to proceed forward.  Past Medical History:  Diagnosis Date  . Arthritis   . Constipation   . Gait disorder 05/26/2016  . H/O left breast biopsy   . High cholesterol   . Hypertension   . Paresthesia 05/26/2016  . Pessary maintenance   . Sinus drainage   . Sleeping difficulty     Past Surgical History:  Procedure Laterality Date  . APPENDECTOMY     AGE 55  . CATARACTS REMOVED    . JOINT REPLACEMENT  1998   RT TOTAL HIP  . TOTAL HIP ARTHROPLASTY  03/17/2012   Procedure: TOTAL HIP ARTHROPLASTY ANTERIOR APPROACH;  Surgeon: Shelda PalMatthew D Olin, MD;  Location: [facility];  Service: Orthopedics;  Laterality: Left;    Family History  Problem Relation Age of Onset  . Heart disease Brother   . Stroke Mother   . Cerebral aneurysm Sister    Social History:  reports that she quit smoking about 57 years ago. She has never used smokeless tobacco. She reports that she does not drink alcohol or use drugs.  Allergies:  Allergies  Allergen Reactions  . Codeine Other (See Comments)    Pass out   . Sulfonamide Derivatives Rash    Medications Prior to Admission  Medication Sig Dispense Refill  . amLODipine (NORVASC) 5 MG tablet Take 5 mg by mouth daily.   0  . bumetanide (BUMEX) 1 MG tablet Take 1 mg by mouth daily after breakfast.    . gabapentin (NEURONTIN)  100 MG capsule Take 1 capsule (100 mg total) by mouth 3 (three) times daily. 90 capsule 1  . ibuprofen (ADVIL,MOTRIN) 200 MG tablet Take 200 mg by mouth every 6 (six) hours as needed for mild pain.     Marland Kitchen. losartan (COZAAR) 100 MG tablet Take 50 mg by mouth 2 (two) times daily.    Marland Kitchen. omeprazole (PRILOSEC) 20 MG capsule Take 20 mg by mouth daily.   0  . rosuvastatin (CRESTOR) 20 MG tablet Take 20 mg by mouth daily after breakfast.      No results found for this or any previous visit (from the past 48 hour(s)). No results found.  Review of Systems  Constitutional: Negative.   HENT: Negative.   Eyes: Negative.   Respiratory: Negative.   Cardiovascular: Negative.   Gastrointestinal: Negative.   Genitourinary: Negative.   Musculoskeletal: Positive for back pain and myalgias.  Skin: Negative.   Neurological: Positive for tingling and sensory change.  Psychiatric/Behavioral: Negative.     Blood pressure (!) 143/84, pulse 95, temperature 97.9 F (36.6 C), temperature source Oral, resp. rate 18, height 4\' 10"  (1.473 m), weight 61.7 kg (136 lb 1 oz), SpO2 97 %. Physical Exam  Constitutional: She is oriented to person, place, and time. She appears well-developed and well-nourished.  HENT:  Head: Normocephalic.  Eyes: Pupils are equal, round, and  reactive to light.  Neck: Normal range of motion.  Respiratory: Effort normal and breath sounds normal.  GI: Soft. Bowel sounds are normal.  Neurological: She is alert and oriented to person, place, and time.  Strength is 5 out of 5 in her iliopsoas, quads, hip she's, gastric, into tibialis, and EHL. On the right. On the left she has some weakness on dorsiflexion at about 4 minus out of 5.  Skin: Skin is warm and dry.     Assessment/Plan 82 year old presents for L3-4 L4-5 decompression and possible fusion.  Emmalin Jaquess P, MD 07/24/2016, 8:28 AM

## 2016-07-24 NOTE — Care Management CC44 (Signed)
Condition Code 44 Documentation Completed  Patient Details  Name: Doristine BosworthBetty W Caisse MRN: [id number] Date of Birth: 1927/03/13   Condition Code 44 given:   yes Patient signature on Condition Code 44 notice:   yes Documentation of 2 MD's agreement:   yes Code 44 added to claim:   yes    Anda KraftRobarge, Raimi Guillermo C, RN 07/24/2016, 5:25 PM

## 2016-07-24 NOTE — Evaluation (Signed)
Occupational Therapy Evaluation and Discharge Patient Details Name: Carrie BosworthBetty W Sanders MRN: [id number] DOB: [birth year] Today's Date: 07/24/2016    History of Present Illness Pt is an 82 y.o. female s/p Laminectomy and Foraminotomy - Lumbar three-four lumbar four-five bilateral. PMHx: Arthritis, High cholesterol, HTN, Paresthesia, Bil THA.    Clinical Impression   Pt reports she was independent with ADL PTA. Currently pt overall supervision for safety with ADL and functional mobility with the exception of min assist for LB dressing; pt reports daughter can assist as needed. All back, safety, and ADL education complete; pt with no further questions or concerns for OT at this time. Pt planning to d/c home with 24/7 supervision. No further acute OT needs identified; signing off at this time. Please re-consult if needs change. Thank you for this referral.    Follow Up Recommendations  No OT follow up;Supervision/Assistance - 24 hour (initially)    Equipment Recommendations  None recommended by OT    Recommendations for Other Services       Precautions / Restrictions Precautions Precautions: Fall;Back Precaution Booklet Issued: Yes (comment) Precaution Comments: Educated pt on back precautions. Restrictions Weight Bearing Restrictions: No      Mobility Bed Mobility Overal bed mobility: Needs Assistance Bed Mobility: Rolling;Sit to Sidelying Rolling: Supervision      Sit to sidelying: Supervision General bed mobility comments: no physical assist required, supervision for safety. Cues for technique, increased time to perform  Transfers Overall transfer level: Needs assistance Equipment used: Rolling walker (2 wheeled) Transfers: Sit to/from Stand Sit to Stand: Supervision         General transfer comment: VCs for hand placement    Balance Overall balance assessment: Needs assistance Sitting-balance support: Feet supported;No upper extremity supported Sitting  balance-Leahy Scale: Good     Standing balance support: Bilateral upper extremity supported Standing balance-Leahy Scale: Fair Standing balance comment: use of RW but able to static stand without device                            ADL Overall ADL's : Needs assistance/impaired Eating/Feeding: Modified independent;Sitting   Grooming: Supervision/safety;Standing Grooming Details (indicate cue type and reason): Educated pt on use of 2 cups for oral care Upper Body Bathing: Set up;Supervision/ safety;Sitting   Lower Body Bathing: Supervison/ safety;Sit to/from stand   Upper Body Dressing : Set up;Supervision/safety;Sitting Upper Body Dressing Details (indicate cue type and reason): to doff hospital gown Lower Body Dressing: Minimal assistance;Sit to/from stand Lower Body Dressing Details (indicate cue type and reason): Pt able to cross L foot over opposite knee; unable to cross R foot. Pt reports daughter can assist as needed. No need for AE. Toilet Transfer: Supervision/safety;Ambulation;Regular Toilet;RW Toilet Transfer Details (indicate cue type and reason): Simulated by sit <> stand from EOB with functional mobility in room. Toileting- Clothing Manipulation and Hygiene: Supervision/safety;Sit to/from stand Toileting - Clothing Manipulation Details (indicate cue type and reason): Pt able to demo reaching for peri care without twisting. Tub/ Shower Transfer: Supervision/safety;Walk-in shower;Ambulation;Shower Dealerseat;Rolling walker Tub/Shower Transfer Details (indicate cue type and reason): Educated on shower transfer technique with use of RW; pt able to return demo simulation in room. Will have daughter present to supervise shower transfer. Functional mobility during ADLs: Supervision/safety;Rolling walker General ADL Comments: Educated pt on maintaining back precautions during functional activities, log roll technique for bed mobility, keeping frequently used items at counter top  height.      Vision Vision Assessment?: No  apparent visual deficits   Perception     Praxis      Pertinent Vitals/Pain Pain Assessment: Faces Faces Pain Scale: Hurts little more Pain Location: back Pain Descriptors / Indicators: Sore Pain Intervention(s): Monitored during session;Repositioned     Hand Dominance Right   Extremity/Trunk Assessment Upper Extremity Assessment Upper Extremity Assessment: Overall WFL for tasks assessed   Lower Extremity Assessment Lower Extremity Assessment: Defer to PT evaluation   Cervical / Trunk Assessment Cervical / Trunk Assessment: Other exceptions Cervical / Trunk Exceptions: s/p lumbar sx   Communication Communication Communication: No difficulties   Cognition Arousal/Alertness: Awake/alert Behavior During Therapy: WFL for tasks assessed/performed Overall Cognitive Status: Within Functional Limits for tasks assessed                     General Comments       Exercises       Shoulder Instructions      Home Living Family/patient expects to be discharged to:: Private residence Living Arrangements: Spouse/significant other Available Help at Discharge: Family;Available 24 hours/day Type of Home: House Home Access: Stairs to enter [employer] of Steps: 2   Home Layout: One level     Bathroom Shower/Tub: Producer, television/film/video: Standard     Home Equipment: Shower seat;Adaptive equipment Adaptive Equipment: Reacher Additional Comments: Pts husband has dementa and uses a RW. Daughter lives next door and will be available to assist as needed.      Prior Functioning/Environment Level of Independence: Independent             OT Diagnosis: Generalized weakness;Acute pain   OT Problem List:     OT Treatment/Interventions:      OT Goals(Current goals can be found in the care plan section) Acute Rehab OT Goals Patient Stated Goal: home tomorrow OT Goal Formulation: All assessment and  education complete, DC therapy  OT Frequency:     Barriers to D/C:            Co-evaluation              End of Session Equipment Utilized During Treatment: Rolling walker Nurse Communication: Mobility status  Activity Tolerance: Patient tolerated treatment well Patient left: in bed;with call bell/phone within reach   Time: 1610-9604 OT Time Calculation (min): 16 min Charges:  OT General Charges $OT Visit: 1 Procedure OT Evaluation $OT Eval Low Complexity: 1 Procedure G-Codes: OT G-codes **NOT FOR INPATIENT CLASS** Functional Assessment Tool Used: Clinical judgement Functional Limitation: Self care Self Care Current Status (V4098): At least 1 percent but less than 20 percent impaired, limited or restricted Self Care Goal Status (J1914): At least 1 percent but less than 20 percent impaired, limited or restricted Self Care Discharge Status [phone]): At least 1 percent but less than 20 percent impaired, limited or restricted   Gaye Alken M.S., OTR/L Pager: [phone]  07/24/2016, 5:12 PM

## 2016-07-24 NOTE — Op Note (Signed)
Preoperative diagnosis: Lumbar spondylosis and stenosis L3-4 L4-5 with a grade 1 spondylolisthesis L4-5 bilateral L4-L5 radiculopathies and neurogenic claudication  Postoperative diagnosis: Same  Procedure: Bilateral decompressive laminectomies L3-4 L4-5 with foraminotomies the L3, L4, L5 nerve roots.  Surgeon: GaJillyn Hiddenry Ulah Olmo  Anesthesia: Gen.  EBL: Minimal  History of present illness: Patient is an [age] show female has had predominantly hip and leg pain numbness and tingling in her legs difficulty walking and weakness in her left foot. Workup revealed severe spinal stenosis with a complete block at L4-5 and moderate spinal stenosis at L3-4. Due the patient's progressive clinical syndrome imaging findings and failure conservative treatment I recommended decompression of L3-4 and L4-5. Preoperative dynamic imaging showed no evidence of movement on flexion extension so I recommended decompression only with the possibility fusion if I noted overt instability. I extensively went over the risks and benefits of the operation with the patient as well as perioperative course expectations of outcome and alternatives of surgery and she understood and agreed to proceed forward.  Operative procedure Patient brought in the or was induced on general anesthesia positioned prone the Wilson frame her back was prepped and draped in routine sterile fashion after infiltration of 10 mL lidocaine with epi a midline incision was made and Bovie left car was used to 10 subcutaneous tissues and subperiosteal dissections care lamina of L3-L4 and the top of L5. Interoperative x-ray confirmed indication 34 disc space level so the spinous process of L4 was removed in its entirety the inferior two thirds of spinous process and lamina of L3 was removed and superior one third of L5 was removed central decompression was initiated there was marked hourglass compression and severe ligamentous hypertrophy causing marked stenosis at L3-4 and L4-5  the 4 Penfield used to dissect the ligament off the dura was removed in piecemeal fashion. This decompressed the central canal. under biting of the medial gutter taking care not to significant violate the joint space was carried out decompress and the proximal foramina of L3-L4 and L5 bilaterally. At the end of decompression was no further stenosis CL 345 foramina were E widely patent easily excepting a coronary dilator. The dura was intact and marked decompression. The wounds and copious irrigated meticulous hemostasis was maintained. Gelfoam was overlaid top of the dura the muscle fascia proximally layers with after Vicryl screws closed running 4 subcuticular Dermabond benzo insertions and a sterile dressing was applied and patient recovered in stable condition. At the case all needle counts sponge counts were correct.

## 2016-07-25 DIAGNOSIS — M4806 Spinal stenosis, lumbar region: Secondary | ICD-10-CM | POA: Diagnosis not present

## 2016-07-25 DIAGNOSIS — I10 Essential (primary) hypertension: Secondary | ICD-10-CM | POA: Diagnosis not present

## 2016-07-25 DIAGNOSIS — K219 Gastro-esophageal reflux disease without esophagitis: Secondary | ICD-10-CM | POA: Diagnosis not present

## 2016-07-25 DIAGNOSIS — E7800 Pure hypercholesterolemia, unspecified: Secondary | ICD-10-CM | POA: Diagnosis not present

## 2016-07-25 DIAGNOSIS — M4316 Spondylolisthesis, lumbar region: Secondary | ICD-10-CM | POA: Diagnosis not present

## 2016-07-25 DIAGNOSIS — M4726 Other spondylosis with radiculopathy, lumbar region: Secondary | ICD-10-CM | POA: Diagnosis not present

## 2016-07-25 MED ORDER — HYDROCODONE-ACETAMINOPHEN 5-325 MG PO TABS: 1.0000 | ORAL_TABLET | ORAL | 0 refills | Status: DC | PRN

## 2016-07-25 NOTE — Discharge Summary (Signed)
Physician Discharge Summary  Patient ID: BLIMY NAPOLEON MRN: [id number] DOB/AGE: Sep 30, 1927 82 y.o.  Admit date: 07/24/2016 Discharge date: 07/25/2016  Admission Diagnoses: Lumbar spinal stenosis    Discharge Diagnoses: Same   Discharged Condition: good  Hospital Course: The patient was admitted on 07/24/2016 and taken to the operating room where the patient underwent decompressive laminectomy. The patient tolerated the procedure well and was taken to the recovery room and then to the floor in stable condition. The hospital course was routine. There were no complications. The wound remained clean dry and intact. Pt had appropriate back soreness. No complaints of leg pain or new N/T/W. The patient remained afebrile with stable vital signs, and tolerated a regular diet. The patient continued to increase activities, and pain was well controlled with oral pain medications.   Consults: None  Significant Diagnostic Studies:  Results for orders placed or performed during the hospital encounter of 07/20/16  Surgical pcr screen  Result Value Ref Range   MRSA, PCR NEGATIVE NEGATIVE   Staphylococcus aureus NEGATIVE NEGATIVE  CBC  Result Value Ref Range   WBC 5.7 4.0 - 10.5 K/uL   RBC 3.63 (L) 3.87 - 5.11 MIL/uL   Hemoglobin 11.3 (L) 12.0 - 15.0 g/dL   HCT 40.9 (L) 81.1 - 91.4 %   MCV 97.8 78.0 - 100.0 fL   MCH 31.1 26.0 - 34.0 pg   MCHC 31.8 30.0 - 36.0 g/dL   RDW 78.2 95.6 - 21.3 %   Platelets 165 150 - 400 K/uL  Basic metabolic panel  Result Value Ref Range   Sodium 140 135 - 145 mmol/L   Potassium 4.3 3.5 - 5.1 mmol/L   Chloride 108 101 - 111 mmol/L   CO2 23 22 - 32 mmol/L   Glucose, Bld 96 65 - 99 mg/dL   BUN 25 (H) 6 - 20 mg/dL   Creatinine, Ser 0.86 (H) 0.44 - 1.00 mg/dL   Calcium 9.5 8.9 - 57.8 mg/dL   GFR calc non Af Amer 41 (L) >60 mL/min   GFR calc Af Amer 48 (L) >60 mL/min   Anion gap 9 5 - 15  Type and screen [facility]  Result Value Ref Range   ABO/RH(D) A POS    Antibody Screen NEG    Sample Expiration 08/03/2016    Extend sample reason NO TRANSFUSIONS OR PREGNANCY IN THE PAST 3 MONTHS   ABO/Rh  Result Value Ref Range   ABO/RH(D) A POS     Dg Lumbar Spine 2-3 Views  Result Date: 07/24/2016 CLINICAL DATA:  Laminectomy. EXAM: LUMBAR SPINE - 2-3 VIEW COMPARISON:  07/07/2016 . FINDINGS: Lumbar spine numbered as per prior MRI . Metallic marker noted posteriorly at L4 level on image number 1. Metallic markers noted posteriorly at L3 and L5 on image number 2. IMPRESSION: Postsurgical changes with metallic markers as above. Electronically Signed   By: Maisie Fus  Register   On: 07/24/2016 10:42    Antibiotics:  Anti-infectives    Start     Dose/Rate Route Frequency Ordered Stop   07/24/16 1600  ceFAZolin (ANCEF) IVPB 1 g/50 mL premix     1 g 100 mL/hr over 30 Minutes Intravenous Every 8 hours 07/24/16 1301 07/25/16 0030   07/24/16 0800  bacitracin 50,000 Units in sodium chloride irrigation 0.9 % 500 mL irrigation  Status:  Discontinued       As needed 07/24/16 0934 07/24/16 1056   07/24/16 0716  ceFAZolin (ANCEF) IVPB 2g/100 mL premix  2 g 200 mL/hr over 30 Minutes Intravenous On call to O.R. 07/24/16 [id number] 07/24/16 0858      Discharge Exam: Blood pressure 102/63, pulse 92, temperature 98.4 F (36.9 C), temperature source Oral, resp. rate 18, height 4\' 10"  (1.473 m), weight 61.7 kg (136 lb 1 oz), SpO2 95 %. Neurologic: Grossly normal Dressing dry  Discharge Medications:     Medication List    TAKE these medications   amLODipine 5 MG tablet Commonly known as:  NORVASC Take 5 mg by mouth daily.   bumetanide 1 MG tablet Commonly known as:  BUMEX Take 1 mg by mouth daily after breakfast.   gabapentin 100 MG capsule Commonly known as:  NEURONTIN Take 1 capsule (100 mg total) by mouth 3 (three) times daily.   HYDROcodone-acetaminophen 5-325 MG tablet Commonly known as:  NORCO/VICODIN Take 1 tablet by mouth every 4  (four) hours as needed (mild pain).   ibuprofen 200 MG tablet Commonly known as:  ADVIL,MOTRIN Take 200 mg by mouth every 6 (six) hours as needed for mild pain.   losartan 100 MG tablet Commonly known as:  COZAAR Take 50 mg by mouth 2 (two) times daily.   omeprazole 20 MG capsule Commonly known as:  PRILOSEC Take 20 mg by mouth daily.   oxyCODONE 5 MG immediate release tablet Commonly known as:  ROXICODONE Take 1 tablet (5 mg total) by mouth every 6 (six) hours as needed for severe pain.   rosuvastatin 20 MG tablet Commonly known as:  CRESTOR Take 20 mg by mouth daily after breakfast.       Disposition: Home   Final Dx: Decompressive laminectomy for stenosis       Signed: Kahmya Pinkham S 07/25/2016, 9:57 AM

## 2016-07-25 NOTE — Progress Notes (Signed)
Pt and daughter given D/C instructions with Rx, verbal understanding was provided. Pt's incision is clean and dry with no sign of infection. Pt's IV was removed prior to D/C. Pt D/C'd home via wheelchair @ 1030 per MD order. Pt is stable @ D/C and has no other needs at this time. Rema FendtAshley Shanetha Bradham, RN

## 2016-07-25 NOTE — Discharge Instructions (Signed)

## 2016-09-10 DIAGNOSIS — D649 Anemia, unspecified: Secondary | ICD-10-CM | POA: Diagnosis not present

## 2016-09-10 DIAGNOSIS — R7301 Impaired fasting glucose: Secondary | ICD-10-CM | POA: Diagnosis not present

## 2016-09-10 DIAGNOSIS — E785 Hyperlipidemia, unspecified: Secondary | ICD-10-CM | POA: Diagnosis not present

## 2016-09-10 DIAGNOSIS — E119 Type 2 diabetes mellitus without complications: Secondary | ICD-10-CM | POA: Diagnosis not present

## 2016-09-10 DIAGNOSIS — I10 Essential (primary) hypertension: Secondary | ICD-10-CM | POA: Diagnosis not present

## 2016-09-10 DIAGNOSIS — E782 Mixed hyperlipidemia: Secondary | ICD-10-CM | POA: Diagnosis not present

## 2016-09-10 DIAGNOSIS — E039 Hypothyroidism, unspecified: Secondary | ICD-10-CM | POA: Diagnosis not present

## 2016-09-10 DIAGNOSIS — N182 Chronic kidney disease, stage 2 (mild): Secondary | ICD-10-CM | POA: Diagnosis not present

## 2016-09-14 DIAGNOSIS — M25559 Pain in unspecified hip: Secondary | ICD-10-CM | POA: Diagnosis not present

## 2016-09-14 DIAGNOSIS — Z6826 Body mass index (BMI) 26.0-26.9, adult: Secondary | ICD-10-CM | POA: Diagnosis not present

## 2016-09-14 DIAGNOSIS — N182 Chronic kidney disease, stage 2 (mild): Secondary | ICD-10-CM | POA: Diagnosis not present

## 2016-09-14 DIAGNOSIS — K219 Gastro-esophageal reflux disease without esophagitis: Secondary | ICD-10-CM | POA: Diagnosis not present

## 2016-09-14 DIAGNOSIS — M545 Low back pain: Secondary | ICD-10-CM | POA: Diagnosis not present

## 2016-09-14 DIAGNOSIS — I10 Essential (primary) hypertension: Secondary | ICD-10-CM | POA: Diagnosis not present

## 2016-09-14 DIAGNOSIS — D649 Anemia, unspecified: Secondary | ICD-10-CM | POA: Diagnosis not present

## 2016-12-15 DIAGNOSIS — M542 Cervicalgia: Secondary | ICD-10-CM | POA: Diagnosis not present

## 2016-12-15 DIAGNOSIS — M5137 Other intervertebral disc degeneration, lumbosacral region: Secondary | ICD-10-CM | POA: Diagnosis not present

## 2016-12-15 DIAGNOSIS — G629 Polyneuropathy, unspecified: Secondary | ICD-10-CM | POA: Diagnosis not present

## 2016-12-17 ENCOUNTER — Other Ambulatory Visit (HOSPITAL_COMMUNITY): Payer: Self-pay | Admitting: Neurosurgery

## 2016-12-17 DIAGNOSIS — M542 Cervicalgia: Secondary | ICD-10-CM

## 2016-12-23 ENCOUNTER — Ambulatory Visit (HOSPITAL_COMMUNITY)
Admission: RE | Admit: 2016-12-23 | Discharge: 2016-12-23 | Disposition: A | Discharge status: Home / Self Care | Payer: Medicare Other | Source: Ambulatory Visit | Attending: Neurosurgery | Admitting: Neurosurgery

## 2016-12-23 DIAGNOSIS — M5030 Other cervical disc degeneration, unspecified cervical region: Secondary | ICD-10-CM | POA: Insufficient documentation

## 2016-12-23 DIAGNOSIS — M542 Cervicalgia: Secondary | ICD-10-CM | POA: Diagnosis present

## 2016-12-23 DIAGNOSIS — M4802 Spinal stenosis, cervical region: Secondary | ICD-10-CM | POA: Insufficient documentation

## 2016-12-28 DIAGNOSIS — M5412 Radiculopathy, cervical region: Secondary | ICD-10-CM | POA: Diagnosis not present

## 2016-12-28 DIAGNOSIS — R29898 Other symptoms and signs involving the musculoskeletal system: Secondary | ICD-10-CM | POA: Diagnosis not present

## 2016-12-28 DIAGNOSIS — M9981 Other biomechanical lesions of cervical region: Secondary | ICD-10-CM | POA: Diagnosis not present

## 2016-12-28 DIAGNOSIS — G5603 Carpal tunnel syndrome, bilateral upper limbs: Secondary | ICD-10-CM | POA: Diagnosis not present

## 2016-12-28 DIAGNOSIS — M48061 Spinal stenosis, lumbar region without neurogenic claudication: Secondary | ICD-10-CM | POA: Diagnosis not present

## 2017-01-18 DIAGNOSIS — G5603 Carpal tunnel syndrome, bilateral upper limbs: Secondary | ICD-10-CM | POA: Diagnosis not present

## 2017-01-18 DIAGNOSIS — M542 Cervicalgia: Secondary | ICD-10-CM | POA: Diagnosis not present

## 2017-01-18 DIAGNOSIS — R29898 Other symptoms and signs involving the musculoskeletal system: Secondary | ICD-10-CM | POA: Diagnosis not present

## 2017-01-26 ENCOUNTER — Ambulatory Visit (HOSPITAL_COMMUNITY): Payer: Medicare Other | Attending: Neurosurgery | Admitting: Physical Therapy

## 2017-01-26 DIAGNOSIS — M5412 Radiculopathy, cervical region: Secondary | ICD-10-CM | POA: Insufficient documentation

## 2017-01-26 DIAGNOSIS — G8929 Other chronic pain: Secondary | ICD-10-CM | POA: Insufficient documentation

## 2017-01-26 DIAGNOSIS — M25612 Stiffness of left shoulder, not elsewhere classified: Secondary | ICD-10-CM | POA: Diagnosis not present

## 2017-01-26 DIAGNOSIS — M25512 Pain in left shoulder: Secondary | ICD-10-CM | POA: Insufficient documentation

## 2017-01-26 NOTE — Therapy (Signed)
[facility] [address] [place], [place], [address] Phone: [phone]   Fax:  [phone]  Physical Therapy Evaluation  Patient Details  Name: Carrie BosworthBetty W Sanders MRN: [id number] Date of Birth: 07-08-1927 Referring Provider: Donalee CitrinGary Cram  Encounter Date: 01/26/2017      PT End of Session - 01/26/17 1609    Visit Number 1   Number of Visits 12   Date for PT Re-Evaluation 02/25/17   Authorization Type medicare    Authorization - Visit Number 1   Authorization - Number of Visits 10   PT Start Time 1520   PT Stop Time 1606   PT Time Calculation (min) 46 min   Activity Tolerance Patient tolerated treatment well      Past Medical History:  Diagnosis Date  . Arthritis   . Constipation   . Gait disorder 05/26/2016  . H/O left breast biopsy   . High cholesterol   . Hypertension   . Paresthesia 05/26/2016  . Pessary maintenance   . Sinus drainage   . Sleeping difficulty     Past Surgical History:  Procedure Laterality Date  . APPENDECTOMY     AGE 68  . CATARACTS REMOVED    . JOINT REPLACEMENT  1998   RT TOTAL HIP  . TOTAL HIP ARTHROPLASTY  03/17/2012   Procedure: TOTAL HIP ARTHROPLASTY ANTERIOR APPROACH;  Surgeon: Shelda PalMatthew D Olin, MD;  Location: [facility];  Service: Orthopedics;  Laterality: Left;    There were no vitals filed for this visit.       Subjective Assessment - 01/26/17 1526    Subjective Carrie Sanders states that her main concern is that her feet and hands are getting numb.  Pt has increased pain in her Lt shoulder when she lies on it.  She is only able to raise her arm so far.     Pertinent History MRI C3-5 moderate disc narrowing; C56 severe, C6-7 moderate to severe narrowing, C7-T1 moderat facet , T1-T2 moderate to severe unable to operate due to age.     Currently in Pain? Yes   Pain Score --  varies 7-8, lowest pain 0    Pain Location Shoulder   Pain Orientation Left   Pain Descriptors / Indicators Aching;Burning    Pain Type Chronic pain   Pain Onset More than a month ago   Pain Frequency Intermittent   Aggravating Factors  lifting    Pain Relieving Factors sidebend to the left    Effect of Pain on Daily Activities increases             [facility] PT Assessment - 01/26/17 0001      Assessment   Medical Diagnosis cervicalagia   Referring Provider Donalee CitrinGary Cram   Onset Date/Surgical Date --  chronic    Hand Dominance Right   Next MD Visit no return visit    Prior Therapy none     Precautions   Precautions None     Restrictions   Weight Bearing Restrictions No     Balance Screen   Has the patient fallen in the past 6 months No   Has the patient had a decrease in activity level because of a fear of falling?  Yes   Is the patient reluctant to leave their home because of a fear of falling?  Yes     Prior Function   Level of Independence Independent with community mobility with device  with rolling walker   Vocation Retired  Leisure taking care of [employer]     Cognition   Overall Cognitive Status Within Functional Limits for tasks assessed     ROM / Strength   AROM / PROM / Strength AROM;Strength     AROM   AROM Assessment Site Shoulder;Cervical   Right/Left Shoulder Left   Left Shoulder Flexion 115 Degrees  tested sitting    Left Shoulder ABduction --  112 sitting    Cervical Flexion 60 reps causes pt to have increased tightness  and pain going into her Left shoulder    Cervical Extension 40 repetition causes numbess down arms    Cervical - Right Side Bend 20 reps causes no symptoms    Cervical - Left Side Bend 35 reps causes increased pain.    Cervical - Right Rotation 40   Cervical - Left Rotation 40     Strength   Strength Assessment Site Cervical   Cervical Extension 4-/5   Cervical - Right Side Bend 4-/5   Cervical - Left Side Bend 4-/5     Palpation   Palpation comment moderate mm spasm B upper trap area                    [facility] Adult PT  Treatment/Exercise - 01/26/17 0001      Posture/Postural Control   Posture/Postural Control Postural limitations   Postural Limitations Rounded Shoulders;Forward head     Exercises   Exercises Neck     Neck Exercises: Seated   Other Seated Exercise cervical and scapular retraction x 10 each     Neck Exercises: Supine   Other Supine Exercise 1-3 of decompression exercises                 PT Education - 01/26/17 1608    Education provided Yes   Education Details HEP   Person(s) Educated Patient   Methods Explanation   Comprehension Verbalized understanding;Returned demonstration          PT Short Term Goals - 01/26/17 1618      PT SHORT TERM GOAL #1   Title Pt cervical Rotation to increase 10 degrees both sides to allow pt to visit with friends when seated on a bench.    Time 3   Period Weeks   Status New     PT SHORT TERM GOAL #2   Title Pt trapezius mm spasm bilaterally to be decreased to minimal to moderate to take pressue off nerve so that pt stattes that her numbness in her UE is decreased by 20%   Time 3   Period Weeks   Status New     PT SHORT TERM GOAL #3   Title Pt LT shoulder ROM for flexion to be incrased 15 degrees to allow pt to don clothing and wash easier.    Time 3   Period Weeks   Status New     PT SHORT TERM GOAL #4   Title Pt cervical pain to be no greater than a 6/10 to allow pt to complete 20 minutes of light house tasks in comfort.    Time 3   Period Weeks   Status New           PT Long Term Goals - 01/26/17 1623      PT LONG TERM GOAL #1   Title Pt  bilateral cervical rotation to be increased by 15 degrees to be able to visit longer with friends to either side of her    Time  6   Period Weeks   Status New     PT LONG TERM GOAL #2   Title Pt left shoulder flexion to be increased by 30 degees to allow pt to place items in higher cabinets,(lower shelf of higher cabinets).   Time 6   Period Weeks   Status New     PT LONG  TERM GOAL #3   Title Pt cervical  pain to be no greater than a 4/10 to allow pt to complete 30 mintues of light house duties.   Time 6   Period Weeks   Status New     PT LONG TERM GOAL #4   Title PT mm spasm in traps to be decreased to mild to allow Pt numbness in UE to  decreased by 30% to allow pt to don clothing easier.   Time 6   Period Weeks   Status New               Plan - 2017-02-10 1610    Clinical Impression Statement Carrie. Feliz is an 83 yo female who had Decompressive laminectomy on her low back on 07/24/2016 that significantly reduced her pain.   Therefore she went to her MD to inquire about possible neck surgery but the physican feels the surgery would be to risky due to multiple levels involved and pt age.  Her pain is chronic and progressive in nature to where she has numbness down both arms into her hands.  She is being referred to skilled physical therapy; examination demonstrates increased mm spasm decreased cervical and UE strength, decreased cervical and Lt shoulder ROM and increased pain.  She will benefit from skilled physical therapy to address these issues and maximize her functional ability.     Rehab Potential Good   PT Frequency 2x / week   PT Duration 6 weeks   PT Treatment/Interventions ADLs/Self Care Home Management;Moist Heat;Functional mobility training;Therapeutic activities;Therapeutic exercise;Patient/family education;Manual techniques;Passive range of motion   PT Next Visit Plan Begin manual to cervical area, supine cervical stabilzation with isometric cervical sidebend and wand exercise for shoulder flexion and abduction keeping cervical area stable, add step 4 and 5 to decompression exercises.    PT Home Exercise Plan 1 2 and 3 of decompression exercises, sitting scapular and cervical retraction.    Consulted and Agree with Plan of Care Patient      Patient will benefit from skilled therapeutic intervention in order to improve the following  deficits and impairments:  Decreased activity tolerance, Decreased balance, Decreased strength, Decreased range of motion, Decreased mobility, Difficulty walking, Pain, Increased muscle spasms, Impaired UE functional use  Visit Diagnosis: Radiculopathy, cervical region  Stiffness of left shoulder, not elsewhere classified  Chronic left shoulder pain      G-Codes - 02/10/2017 1629    Functional Assessment Tool Used (Outpatient Only) clinical judgement:  ROM, strength numbness   Functional Limitation Self care   Self Care Current Status (Z6109) At least 60 percent but less than 80 percent impaired, limited or restricted   Self Care Goal Status (U0454) At least 40 percent but less than 60 percent impaired, limited or restricted       Problem List Patient Active Problem List   Diagnosis Date Noted  . Spinal stenosis at L4-L5 level 07/24/2016  . Paresthesia 05/26/2016  . Gait disorder 05/26/2016  . S/P Left THA, AA 03/17/2012  . KNEE, ARTHRITIS, DEGEN./OSTEO 05/23/2009  . KNEE PAIN 05/23/2009  . ANSERINE BURSITIS, LEFT 05/23/2009  . TRIGGER  FINGER 05/23/2009  . HAND PAIN 05/23/2009  . HIGH BLOOD PRESSURE 05/23/2009    Virgina Organ, PT CLT [id number] 01/26/2017, 4:32 PM  [facility] [address] [place], [place], [address] Phone: [phone]   Fax:  [phone]  Name: Carrie Sanders MRN: [id number] Date of Birth: 07-10-1927

## 2017-01-26 NOTE — Patient Instructions (Signed)
Flexibility: Neck Retraction    Pull head straight back, keeping eyes and jaw level while pushing your feet into the ground  Repeat _10___ times per set. Do ___1_ sets per session. Do __3__ sessions per day.  http://orth.exer.us/344   Copyright  VHI. All rights reserved.  Scapular Retraction (Standing)   Sitting  With arms at sides, pinch shoulder blades together and down . Repeat 10____ times per set. Do1 ____ sets per session. Do ___3_ sessions per day.  http://orth.exer.us/944   Copyright  VHI. All rights reserved.

## 2017-01-29 ENCOUNTER — Ambulatory Visit (HOSPITAL_COMMUNITY): Payer: Medicare Other | Attending: Neurosurgery

## 2017-01-29 DIAGNOSIS — M5412 Radiculopathy, cervical region: Secondary | ICD-10-CM | POA: Insufficient documentation

## 2017-01-29 DIAGNOSIS — M25512 Pain in left shoulder: Secondary | ICD-10-CM | POA: Insufficient documentation

## 2017-01-29 DIAGNOSIS — G8929 Other chronic pain: Secondary | ICD-10-CM | POA: Insufficient documentation

## 2017-01-29 DIAGNOSIS — M25612 Stiffness of left shoulder, not elsewhere classified: Secondary | ICD-10-CM | POA: Diagnosis not present

## 2017-01-29 NOTE — Therapy (Signed)
[facility] [address] [place], [place], [address] Phone: [phone]   Fax:  [phone]  Physical Therapy Treatment  Patient Details  Name: Carrie Sanders MRN: [id number] Date of Birth: 1926/12/21 Referring Provider: Donalee Citrin  Encounter Date: 01/29/2017      PT End of Session - 01/29/17 1502    Visit Number 2   Number of Visits 12   Date for PT Re-Evaluation 02/25/17   Authorization Type medicare    Authorization - Visit Number 2   Authorization - Number of Visits 10   PT Start Time 1435   PT Stop Time 1519   PT Time Calculation (min) 44 min   Activity Tolerance Patient tolerated treatment well;No increased pain   Behavior During Therapy WFL for tasks assessed/performed      Past Medical History:  Diagnosis Date  . Arthritis   . Constipation   . Gait disorder 05/26/2016  . H/O left breast biopsy   . High cholesterol   . Hypertension   . Paresthesia 05/26/2016  . Pessary maintenance   . Sinus drainage   . Sleeping difficulty     Past Surgical History:  Procedure Laterality Date  . APPENDECTOMY     AGE 26  . CATARACTS REMOVED    . JOINT REPLACEMENT  1998   RT TOTAL HIP  . TOTAL HIP ARTHROPLASTY  03/17/2012   Procedure: TOTAL HIP ARTHROPLASTY ANTERIOR APPROACH;  Surgeon: Shelda Pal, MD;  Location: [facility];  Service: Orthopedics;  Laterality: Left;    There were no vitals filed for this visit.      Subjective Assessment - 01/29/17 1439    Subjective Pt stated she is feeling good today, no reports of pain does continue to c/o numbness in hands and feet.  REports compliancei wth HEP daily without questions   Pertinent History MRI C3-5 moderate disc narrowing; C56 severe, C6-7 moderate to severe narrowing, C7-T1 moderat facet , T1-T2 moderate to severe unable to operate due to age.     Currently in Pain? No/denies                         [facility] Adult PT Treatment/Exercise - 01/29/17 0001      Neck Exercises: Seated   Cervical Isometrics Right lateral flexion;Left lateral flexion;Right rotation;Left rotation;5 secs   Neck Retraction 10 reps   Neck Retraction Limitations Multimodal cueing for proper form   Shoulder Flexion --   Shoulder Flexion Limitations --   Shoulder Abduction Limitations --   Other Seated Exercise cervical and scapular retraction x 10 each   Other Seated Exercise --     Neck Exercises: Supine   Neck Retraction 10 reps;5 secs   Shoulder Flexion 10 reps   Shoulder Flexion Limitations wand   Shoulder Abduction Limitations Begin next session with wand   Other Supine Exercise 1-5 decompression exercises   Other Supine Exercise scapuar retraction 10x 5"     Manual Therapy   Manual Therapy Soft tissue mobilization   Manual therapy comments Manual complete separate rest of tx   Soft tissue mobilization Supine position with STM to upper trap and cervical musculature to reduce spasms                PT Education - 01/29/17 1501    Education provided Yes   Education Details Reviewed goals, assured compliance wiht HEP and educated proper technqiue/form with cervical retraction, copy of eval given to  pt.     Person(s) Educated Patient   Methods Explanation;Demonstration;Handout   Comprehension Verbalized understanding;Returned demonstration;Need further instruction          PT Short Term Goals - 01/26/17 1618      PT SHORT TERM GOAL #1   Title Pt cervical Rotation to increase 10 degrees both sides to allow pt to visit with friends when seated on a bench.    Time 3   Period Weeks   Status New     PT SHORT TERM GOAL #2   Title Pt trapezius mm spasm bilaterally to be decreased to minimal to moderate to take pressue off nerve so that pt stattes that her numbness in her UE is decreased by 20%   Time 3   Period Weeks   Status New     PT SHORT TERM GOAL #3   Title Pt LT shoulder ROM for flexion to be incrased 15 degrees to allow pt to don clothing  and wash easier.    Time 3   Period Weeks   Status New     PT SHORT TERM GOAL #4   Title Pt cervical pain to be no greater than a 6/10 to allow pt to complete 20 minutes of light house tasks in comfort.    Time 3   Period Weeks   Status New           PT Long Term Goals - 01/26/17 1623      PT LONG TERM GOAL #1   Title Pt  bilateral cervical rotation to be increased by 15 degrees to be able to visit longer with friends to either side of her    Time 6   Period Weeks   Status New     PT LONG TERM GOAL #2   Title Pt left shoulder flexion to be increased by 30 degees to allow pt to place items in higher cabinets,(lower shelf of higher cabinets).   Time 6   Period Weeks   Status New     PT LONG TERM GOAL #3   Title Pt cervical  pain to be no greater than a 4/10 to allow pt to complete 30 mintues of light house duties.   Time 6   Period Weeks   Status New     PT LONG TERM GOAL #4   Title PT mm spasm in traps to be decreased to mild to allow Pt numbness in UE to  decreased by 30% to allow pt to don clothing easier.   Time 6   Period Weeks   Status New               Plan - 01/29/17 1502    Clinical Impression Statement Reviewed goals, assured compliance iwht HEP and educated technique and form with cervical retraction, copy of eval given to pt.  Began session wtih STM to address tightness in trapezius musculature, pt reports increased tingling to Lt UE following massage.  Therex focus on improving cervical stability, posture awareness and strengthening.  Therapist facilitation to improve form with cervical and scapular retraction and education on importance of proper posture for pain control.  No reports of increased pain through session.     Rehab Potential Good   PT Frequency 2x / week   PT Duration 6 weeks   PT Treatment/Interventions ADLs/Self Care Home Management;Moist Heat;Functional mobility training;Therapeutic activities;Therapeutic exercise;Patient/family  education;Manual techniques;Passive range of motion   PT Next Visit Plan Continue manual to cervical area, continue cervical  stabilizaiton with postural strengthening, isometric cervical strenghtening and wand exercises for flexion and abduction   PT Home Exercise Plan 1 2 and 3 of decompression exercises, sitting scapular and cervical retraction.       Patient will benefit from skilled therapeutic intervention in order to improve the following deficits and impairments:  Decreased activity tolerance, Decreased balance, Decreased strength, Decreased range of motion, Decreased mobility, Difficulty walking, Pain, Increased muscle spasms, Impaired UE functional use  Visit Diagnosis: Radiculopathy, cervical region  Stiffness of left shoulder, not elsewhere classified  Chronic left shoulder pain     Problem List Patient Active Problem List   Diagnosis Date Noted  . Spinal stenosis at L4-L5 level 07/24/2016  . Paresthesia 05/26/2016  . Gait disorder 05/26/2016  . S/P Left THA, AA 03/17/2012  . KNEE, ARTHRITIS, DEGEN./OSTEO 05/23/2009  . KNEE PAIN 05/23/2009  . ANSERINE BURSITIS, LEFT 05/23/2009  . TRIGGER FINGER 05/23/2009  . HAND PAIN 05/23/2009  . HIGH BLOOD PRESSURE 05/23/2009   Becky Saxasey Norelle Runnion, LPTA; CBIS [phone]  Juel BurrowCockerham, Milee Qualls Jo 01/29/2017, 4:29 PM  [facility] [address] [place], [place], [address] Phone: [phone]   Fax:  [phone]  Name: Carrie Sanders MRN: [id number] Date of Birth: [birth year]

## 2017-02-03 ENCOUNTER — Ambulatory Visit (HOSPITAL_COMMUNITY): Payer: Medicare Other

## 2017-02-03 ENCOUNTER — Encounter (HOSPITAL_COMMUNITY): Payer: Self-pay

## 2017-02-03 DIAGNOSIS — G8929 Other chronic pain: Secondary | ICD-10-CM

## 2017-02-03 DIAGNOSIS — M25512 Pain in left shoulder: Secondary | ICD-10-CM | POA: Diagnosis not present

## 2017-02-03 DIAGNOSIS — M5412 Radiculopathy, cervical region: Secondary | ICD-10-CM

## 2017-02-03 DIAGNOSIS — M25612 Stiffness of left shoulder, not elsewhere classified: Secondary | ICD-10-CM | POA: Diagnosis not present

## 2017-02-03 NOTE — Therapy (Signed)
[facility] [address] [place], [place], [address] Phone: [phone]   Fax:  [phone]  Physical Therapy Treatment  Patient Details  Name: Carrie Sanders MRN: [id number] Date of Birth: December 13, 1926 Referring Provider: Donalee Citrin  Encounter Date: 02/03/2017      PT End of Session - 02/03/17 1435    Visit Number 3   Number of Visits 12   Date for PT Re-Evaluation 02/25/17   Authorization Type medicare    Authorization - Visit Number 3   Authorization - Number of Visits 10   PT Start Time 1432   PT Stop Time 1514   PT Time Calculation (min) 42 min   Activity Tolerance Patient tolerated treatment well;No increased pain   Behavior During Therapy WFL for tasks assessed/performed      Past Medical History:  Diagnosis Date  . Arthritis   . Constipation   . Gait disorder 05/26/2016  . H/O left breast biopsy   . High cholesterol   . Hypertension   . Paresthesia 05/26/2016  . Pessary maintenance   . Sinus drainage   . Sleeping difficulty     Past Surgical History:  Procedure Laterality Date  . APPENDECTOMY     AGE 83  . CATARACTS REMOVED    . JOINT REPLACEMENT  1998   RT TOTAL HIP  . TOTAL HIP ARTHROPLASTY  03/17/2012   Procedure: TOTAL HIP ARTHROPLASTY ANTERIOR APPROACH;  Surgeon: Shelda Pal, MD;  Location: [facility];  Service: Orthopedics;  Laterality: Left;    There were no vitals filed for this visit.      Subjective Assessment - 02/03/17 1433    Subjective Pt states she feels "pooped" because she had a hard morning. She states her neck has started to stiffen up over the last few days but overall it is much better.   Pertinent History MRI C3-5 moderate disc narrowing; C56 severe, C6-7 moderate to severe narrowing, C7-T1 moderat facet , T1-T2 moderate to severe unable to operate due to age.     Currently in Pain? No/denies                [facility] Adult PT Treatment/Exercise - 02/03/17 0001      Neck Exercises:  Seated   Other Seated Exercise bil scap retraction with RTB while maintaining cervical retraction/proper posture 2x10; bil low rows with RTB while maintaining cervical retraction 2x10     Neck Exercises: Supine   Cervical Isometrics Right lateral flexion;Left lateral flexion;Right rotation;Left rotation;5 secs;10 reps  manual resistance from PT   Shoulder Flexion Both;10 reps  2 sets each of bil OH and R/L flexion   Shoulder Flexion Limitations wand   Shoulder ABduction Left;10 reps  2 sets; mod cues for proper technique   Shoulder Abduction Limitations wand     Manual Therapy   Manual Therapy Soft tissue mobilization   Manual therapy comments Manual complete separate rest of tx   Soft tissue mobilization muscle stripping and efflurage to L UT and cervical paraspinals to decrease soft tissue restrictions             PT Short Term Goals - 01/26/17 1618      PT SHORT TERM GOAL #1   Title Pt cervical Rotation to increase 10 degrees both sides to allow pt to visit with friends when seated on a bench.    Time 3   Period Weeks   Status New     PT SHORT TERM GOAL #  2   Title Pt trapezius mm spasm bilaterally to be decreased to minimal to moderate to take pressue off nerve so that pt stattes that her numbness in her UE is decreased by 20%   Time 3   Period Weeks   Status New     PT SHORT TERM GOAL #3   Title Pt LT shoulder ROM for flexion to be incrased 15 degrees to allow pt to don clothing and wash easier.    Time 3   Period Weeks   Status New     PT SHORT TERM GOAL #4   Title Pt cervical pain to be no greater than a 6/10 to allow pt to complete 20 minutes of light house tasks in comfort.    Time 3   Period Weeks   Status New           PT Long Term Goals - 01/26/17 1623      PT LONG TERM GOAL #1   Title Pt  bilateral cervical rotation to be increased by 15 degrees to be able to visit longer with friends to either side of her    Time 6   Period Weeks   Status New      PT LONG TERM GOAL #2   Title Pt left shoulder flexion to be increased by 30 degees to allow pt to place items in higher cabinets,(lower shelf of higher cabinets).   Time 6   Period Weeks   Status New     PT LONG TERM GOAL #3   Title Pt cervical  pain to be no greater than a 4/10 to allow pt to complete 30 mintues of light house duties.   Time 6   Period Weeks   Status New     PT LONG TERM GOAL #4   Title PT mm spasm in traps to be decreased to mild to allow Pt numbness in UE to  decreased by 30% to allow pt to don clothing easier.   Time 6   Period Weeks   Status New            Plan - 02/03/17 1527    Clinical Impression Statement Pt is making progress towards goals. She continues with increased soft tissue restrictions of L UT that is TTP, but she responds very well to manual therapy of that area. She stated that she had more feeling in her L hand following manual techniques. Pt introduced to supine L shoulder ABD with wand this date, which she had some difficulty with. Pt was only able to raise LUE to approximately shoulder level due to pain, but stated she felt better following. Pt continues to need cues to maintain cervical retraction with postural strengthening.   Rehab Potential Good   PT Frequency 2x / week   PT Duration 6 weeks   PT Treatment/Interventions ADLs/Self Care Home Management;Moist Heat;Functional mobility training;Therapeutic activities;Therapeutic exercise;Patient/family education;Manual techniques;Passive range of motion   PT Next Visit Plan Continue manual to cervical area, continue cervical stabilizaiton with postural strengthening, isometric cervical strenghtening and wand exercises for flexion and abduction   PT Home Exercise Plan 1 2 and 3 of decompression exercises, sitting scapular and cervical retraction.       Patient will benefit from skilled therapeutic intervention in order to improve the following deficits and impairments:  Decreased  activity tolerance, Decreased balance, Decreased strength, Decreased range of motion, Decreased mobility, Difficulty walking, Pain, Increased muscle spasms, Impaired UE functional use  Visit Diagnosis: Radiculopathy, cervical  region  Stiffness of left shoulder, not elsewhere classified  Chronic left shoulder pain     Problem List Patient Active Problem List   Diagnosis Date Noted  . Spinal stenosis at L4-L5 level 07/24/2016  . Paresthesia 05/26/2016  . Gait disorder 05/26/2016  . S/P Left THA, AA 03/17/2012  . KNEE, ARTHRITIS, DEGEN./OSTEO 05/23/2009  . KNEE PAIN 05/23/2009  . ANSERINE BURSITIS, LEFT 05/23/2009  . TRIGGER FINGER 05/23/2009  . HAND PAIN 05/23/2009  . HIGH BLOOD PRESSURE 05/23/2009     Jac Canavan PT, DPT   [facility] [address] [place], [place], [address] Phone: [phone]   Fax:  [phone]  Name: JESSLYNN KRUCK MRN: [id number] Date of Birth: 1927/07/16

## 2017-02-04 ENCOUNTER — Ambulatory Visit (HOSPITAL_COMMUNITY): Payer: Medicare Other

## 2017-02-04 DIAGNOSIS — M5412 Radiculopathy, cervical region: Secondary | ICD-10-CM | POA: Diagnosis not present

## 2017-02-04 DIAGNOSIS — M25512 Pain in left shoulder: Secondary | ICD-10-CM

## 2017-02-04 DIAGNOSIS — M25612 Stiffness of left shoulder, not elsewhere classified: Secondary | ICD-10-CM

## 2017-02-04 DIAGNOSIS — G8929 Other chronic pain: Secondary | ICD-10-CM

## 2017-02-04 NOTE — Therapy (Signed)
[facility] [address] [place], [place], [address] Phone: [phone]   Fax:  [phone]  Physical Therapy Treatment  Patient Details  Name: Carrie Sanders MRN: [id number] Date of Birth: [birth year] Referring Provider: Donalee Citrin  Encounter Date: 02/04/2017      PT End of Session - 02/04/17 1343    Visit Number 4   Number of Visits 12   Date for PT Re-Evaluation 02/25/17   Authorization - Visit Number 4   Authorization - Number of Visits 10   PT Start Time 1301   PT Stop Time 1343   PT Time Calculation (min) 42 min   Activity Tolerance Patient tolerated treatment well;No increased pain   Behavior During Therapy WFL for tasks assessed/performed      Past Medical History:  Diagnosis Date  . Arthritis   . Constipation   . Gait disorder 05/26/2016  . H/O left breast biopsy   . High cholesterol   . Hypertension   . Paresthesia 05/26/2016  . Pessary maintenance   . Sinus drainage   . Sleeping difficulty     Past Surgical History:  Procedure Laterality Date  . APPENDECTOMY     AGE 83  . CATARACTS REMOVED    . JOINT REPLACEMENT  1998   RT TOTAL HIP  . TOTAL HIP ARTHROPLASTY  03/17/2012   Procedure: TOTAL HIP ARTHROPLASTY ANTERIOR APPROACH;  Surgeon: Shelda Pal, MD;  Location: [facility];  Service: Orthopedics;  Laterality: Left;    There were no vitals filed for this visit.      Subjective Assessment - 02/04/17 1309    Subjective Pt stated she is feeling good today, no reports of pain today does have some productive soreness.  Feels she is making progress towards goals   Pertinent History MRI C3-5 moderate disc narrowing; C56 severe, C6-7 moderate to severe narrowing, C7-T1 moderat facet , T1-T2 moderate to severe unable to operate due to age.     Currently in Pain? No/denies   Pain Score --  soreness Lt shoulder/neck/UE                         [facility] Adult PT Treatment/Exercise - 02/04/17 0001       Neck Exercises: Seated   Neck Retraction 10 reps   Neck Retraction Limitations Multimodal cueing for proper form   Other Seated Exercise bil scap retraction with RTB while maintaining cervical retraction/proper posture 2x10; bil low rows with RTB while maintaining cervical retraction 2x10     Neck Exercises: Supine   Neck Retraction 10 reps;5 secs   Shoulder Flexion Both;10 reps   Shoulder Flexion Limitations wand   Shoulder ABduction Left;10 reps   Shoulder Abduction Limitations AAROM to improve form with wand   Other Supine Exercise scapuar retraction 10x 5"     Manual Therapy   Manual Therapy Soft tissue mobilization   Manual therapy comments Manual complete separate rest of tx   Soft tissue mobilization muscle stripping and efflurage to L UT and cervical paraspinals to decrease soft tissue restrictions                  PT Short Term Goals - 01/26/17 1618      PT SHORT TERM GOAL #1   Title Pt cervical Rotation to increase 10 degrees both sides to allow pt to visit with friends when seated on a bench.    Time 3   Period Weeks  Status New     PT SHORT TERM GOAL #2   Title Pt trapezius mm spasm bilaterally to be decreased to minimal to moderate to take pressue off nerve so that pt stattes that her numbness in her UE is decreased by 20%   Time 3   Period Weeks   Status New     PT SHORT TERM GOAL #3   Title Pt LT shoulder ROM for flexion to be incrased 15 degrees to allow pt to don clothing and wash easier.    Time 3   Period Weeks   Status New     PT SHORT TERM GOAL #4   Title Pt cervical pain to be no greater than a 6/10 to allow pt to complete 20 minutes of light house tasks in comfort.    Time 3   Period Weeks   Status New           PT Long Term Goals - 01/26/17 1623      PT LONG TERM GOAL #1   Title Pt  bilateral cervical rotation to be increased by 15 degrees to be able to visit longer with friends to either side of her    Time 6   Period Weeks    Status New     PT LONG TERM GOAL #2   Title Pt left shoulder flexion to be increased by 30 degees to allow pt to place items in higher cabinets,(lower shelf of higher cabinets).   Time 6   Period Weeks   Status New     PT LONG TERM GOAL #3   Title Pt cervical  pain to be no greater than a 4/10 to allow pt to complete 30 mintues of light house duties.   Time 6   Period Weeks   Status New     PT LONG TERM GOAL #4   Title PT mm spasm in traps to be decreased to mild to allow Pt numbness in UE to  decreased by 30% to allow pt to don clothing easier.   Time 6   Period Weeks   Status New               Plan - 02/04/17 1346    Clinical Impression Statement Pt reports she is making progress towards goals with reports of pain reduced in Lt neck, compliance with HEP and improving awareness of posture.  Pt continues to c/o pain with Lt shoulder movements and limited mobility.  Began session with MHP during initial subjective findings to warm musculature for increased relaxation prior manual soft tissue mobilizaiton.  Noted multiple spasms Lt UT and tightness pectoralis musculature.  Able to reduce overall tightness though unable to fully resolve.  Pt reports increased feeling to UE following manual.  Therex focus on improving postural strengthening and Lt UE mobility with multimodal cueing with cervical retraction as pt tendency to increase cervical flexion rather than retraction.  Improved AROM with flexion to 130 and abduction to 115 degrees with reports of decreased pain with movement at EOS.     Rehab Potential Good   PT Frequency 2x / week   PT Duration 6 weeks   PT Treatment/Interventions ADLs/Self Care Home Management;Moist Heat;Functional mobility training;Therapeutic activities;Therapeutic exercise;Patient/family education;Manual techniques;Passive range of motion   PT Next Visit Plan Continue manual to cervical area, continue cervical stabilizaiton with postural strengthening,  isometric cervical strenghtening and wand exercises for flexion and abduction.  Begin pully and assure correct tech to improve UE mobilty  PT Home Exercise Plan 1 2 and 3 of decompression exercises, sitting scapular and cervical retraction.       Patient will benefit from skilled therapeutic intervention in order to improve the following deficits and impairments:  Decreased activity tolerance, Decreased balance, Decreased strength, Decreased range of motion, Decreased mobility, Difficulty walking, Pain, Increased muscle spasms, Impaired UE functional use  Visit Diagnosis: Radiculopathy, cervical region  Stiffness of left shoulder, not elsewhere classified  Chronic left shoulder pain     Problem List Patient Active Problem List   Diagnosis Date Noted  . Spinal stenosis at L4-L5 level 07/24/2016  . Paresthesia 05/26/2016  . Gait disorder 05/26/2016  . S/P Left THA, AA 03/17/2012  . KNEE, ARTHRITIS, DEGEN./OSTEO 05/23/2009  . KNEE PAIN 05/23/2009  . ANSERINE BURSITIS, LEFT 05/23/2009  . TRIGGER FINGER 05/23/2009  . HAND PAIN 05/23/2009  . HIGH BLOOD PRESSURE 05/23/2009   Becky Saxasey Maydell Knoebel, LPTA; CBIS [phone]  Juel BurrowCockerham, [place] Duan Jo 02/04/2017, 3:07 PM  [facility] [address] [place], [place], [address] Phone: [phone]   Fax:  [phone]  Name: Doristine BosworthBetty W Armas MRN: [id number] Date of Birth: 1927/04/30

## 2017-02-08 ENCOUNTER — Telehealth (HOSPITAL_COMMUNITY): Payer: Self-pay | Admitting: Internal Medicine

## 2017-02-08 ENCOUNTER — Ambulatory Visit (HOSPITAL_COMMUNITY): Payer: Medicare Other | Admitting: Physical Therapy

## 2017-02-08 NOTE — Telephone Encounter (Signed)
02/08/17 daughter called to cx due to weather

## 2017-02-10 ENCOUNTER — Ambulatory Visit (HOSPITAL_COMMUNITY): Payer: Medicare Other | Admitting: Physical Therapy

## 2017-02-10 DIAGNOSIS — M5412 Radiculopathy, cervical region: Secondary | ICD-10-CM | POA: Diagnosis not present

## 2017-02-10 DIAGNOSIS — M25512 Pain in left shoulder: Secondary | ICD-10-CM

## 2017-02-10 DIAGNOSIS — M25612 Stiffness of left shoulder, not elsewhere classified: Secondary | ICD-10-CM

## 2017-02-10 DIAGNOSIS — G8929 Other chronic pain: Secondary | ICD-10-CM | POA: Diagnosis not present

## 2017-02-10 NOTE — Patient Instructions (Signed)
Strengthening: Extension - Isometric (in Neutral)    Using light pressure from fingertips at back of head, resist bending head backward. Hold _3___ seconds. Repeat _5-10___ times per set. Do _1___ sets per session. Do ___2_ sessions per day.  http://orth.exer.us/308   Copyright  VHI. All rights reserved.  Strengthening: Lateral Bend - Isometric (in Neutral)    Using light pressure from fingertips, press into right temple. Resist bending head sideways. Hold __3__ seconds. Repeat _5-10___ times per set. Do ___1_ sets per session. Do __2__ sessions per day.  http://orth.exer.us/302   Copyright  VHI. All rights reserved.

## 2017-02-10 NOTE — Therapy (Addendum)
[facility], [place], [address] Phone: [phone]   Fax:  [phone]  Physical Therapy Treatment  Patient Details  Name: Carrie Sanders MRN: [id number] Date of Birth: 12/05/1926 Referring Provider: Kary Kos  Encounter Date: 02/10/2017      PT End of Session - 02/10/17 1529    Visit Number 5   Number of Visits 12   Date for PT Re-Evaluation 02/25/17   Authorization - Visit Number 5   Authorization - Number of Visits 10   PT Start Time 4481   PT Stop Time 8563   PT Time Calculation (min) 46 min   Activity Tolerance Patient tolerated treatment well;No increased pain   Behavior During Therapy WFL for tasks assessed/performed      Past Medical History:  Diagnosis Date  . Arthritis   . Constipation   . Gait disorder 05/26/2016  . H/O left breast biopsy   . High cholesterol   . Hypertension   . Paresthesia 05/26/2016  . Pessary maintenance   . Sinus drainage   . Sleeping difficulty     Past Surgical History:  Procedure Laterality Date  . APPENDECTOMY     AGE 83  . CATARACTS REMOVED    . JOINT REPLACEMENT  1998   RT TOTAL HIP  . TOTAL HIP ARTHROPLASTY  03/17/2012   Procedure: TOTAL HIP ARTHROPLASTY ANTERIOR APPROACH;  Surgeon: Mauri Pole, MD;  Location: [facility];  Service: Orthopedics;  Laterality: Left;    There were no vitals filed for this visit.      Subjective Assessment - 02/10/17 1456    Subjective Pt continues to improve in both ROM but she is still weak in her Lt arm and continues to have numbness    Pertinent History MRI C3-5 moderate disc narrowing; C56 severe, C6-7 moderate to severe narrowing, C7-T1 moderat facet , T1-T2 moderate to severe unable to operate due to age.     Currently in Pain? No/denies   Pain Onset More than a month ago                         [facility] Adult PT Treatment/Exercise - 02/10/17 0001      Neck Exercises: Theraband   Other Theraband  Exercises decompression ex with t-band 1-5      Neck Exercises: Seated   Neck Retraction 10 reps   Other Seated Exercise scapular retraction    Other Seated Exercise Pulley for flexion and abduction      Neck Exercises: Supine   Cervical Isometrics Extension;Right lateral flexion;Left lateral flexion;3 secs;5 reps   Other Supine Exercise 1-5 decompression exercises     Manual Therapy   Manual Therapy Soft tissue mobilization   Manual therapy comments Manual complete separate rest of tx   Soft tissue mobilization muscle stripping and efflurage to L UT and cervical paraspinals to decrease soft tissue restrictions                  PT Short Term Goals - 02/10/17 1534      PT SHORT TERM GOAL #1   Title Pt cervical Rotation to increase 10 degrees both sides to allow pt to visit with friends when seated on a bench.    Time 3   Period Weeks   Status Achieved     PT SHORT TERM GOAL #2   Title Pt trapezius mm spasm bilaterally to be decreased to minimal to moderate  to take pressue off nerve so that pt stattes that her numbness in her UE is decreased by 20%   Time 3   Period Weeks   Status On-going     PT SHORT TERM GOAL #3   Title Pt LT shoulder ROM for flexion to be incrased 15 degrees to allow pt to don clothing and wash easier.    Time 3   Period Weeks   Status Achieved     PT SHORT TERM GOAL #4   Title Pt cervical pain to be no greater than a 6/10 to allow pt to complete 20 minutes of light house tasks in comfort.    Time 3   Period Weeks   Status Partially Met           PT Long Term Goals - 02/10/17 1534      PT LONG TERM GOAL #1   Title Pt  bilateral cervical rotation to be increased by 15 degrees to be able to visit longer with friends to either side of her    Time 6   Period Weeks   Status On-going     PT LONG TERM GOAL #2   Title Pt left shoulder flexion to be increased by 30 degees to allow pt to place items in higher cabinets,(lower shelf of higher  cabinets).   Time 6   Period Weeks   Status Achieved     PT LONG TERM GOAL #3   Title Pt cervical  pain to be no greater than a 4/10 to allow pt to complete 30 mintues of light house duties.   Time 6   Period Weeks   Status Partially Met     PT LONG TERM GOAL #4   Title PT mm spasm in traps to be decreased to mild to allow Pt numbness in UE to  decreased by 30% to allow pt to don clothing easier.   Time 6   Period Weeks   Status On-going               Plan - 02/10/17 1530    Clinical Impression Statement Pt ROM of Lt shoulder continues to improve.  Added decompression t-band exercises with multi cuing needed to keep cervical area stabilized througout exercises.  Encouraged pt to complete cervical and scapular retraction frequently while sitting.  Pt given isometric cervical exercises to improve stability.    Rehab Potential Good   PT Frequency 2x / week   PT Duration 6 weeks   PT Treatment/Interventions ADLs/Self Care Home Management;Moist Heat;Functional mobility training;Therapeutic activities;Therapeutic exercise;Patient/family education;Manual techniques;Passive range of motion   PT Next Visit Plan Continue manual to cervical area, continue cervical stabilizaiton with postural strengthening, isometric cervical strenghtening and wand exercises for flexion and abduction.  Begin pullies next session for UE ROM next session   PT Home Exercise Plan 1 2 and 3 of decompression exercises, sitting scapular and cervical retraction. 02/11/2016: decompression 4-5; t6-band decompression exercises as well as isometric cervical exercises.    Consulted and Agree with Plan of Care Patient      Patient will benefit from skilled therapeutic intervention in order to improve the following deficits and impairments:  Decreased activity tolerance, Decreased balance, Decreased strength, Decreased range of motion, Decreased mobility, Difficulty walking, Pain, Increased muscle spasms, Impaired UE  functional use  Visit Diagnosis: Radiculopathy, cervical region  Stiffness of left shoulder, not elsewhere classified  Chronic left shoulder pain     Problem List Patient Active Problem List  Diagnosis Date Noted  . Spinal stenosis at L4-L5 level 07/24/2016  . Paresthesia 05/26/2016  . Gait disorder 05/26/2016  . S/P Left THA, AA 03/17/2012  . KNEE, ARTHRITIS, DEGEN./OSTEO 05/23/2009  . KNEE PAIN 05/23/2009  . ANSERINE BURSITIS, LEFT 05/23/2009  . TRIGGER FINGER 05/23/2009  . HAND PAIN 05/23/2009  . HIGH BLOOD PRESSURE 05/23/2009   Rayetta Humphrey, PT CLT [phone] 02/10/2017, 3:36 PM  [facility] [address] [place], [place], [address] Phone: [phone]   Fax:  [phone]  Name: Carrie Sanders MRN: [id number] Date of Birth: 08/22/1927

## 2017-02-15 ENCOUNTER — Ambulatory Visit (HOSPITAL_COMMUNITY): Payer: Medicare Other | Admitting: Physical Therapy

## 2017-02-15 DIAGNOSIS — M25612 Stiffness of left shoulder, not elsewhere classified: Secondary | ICD-10-CM | POA: Diagnosis not present

## 2017-02-15 DIAGNOSIS — G8929 Other chronic pain: Secondary | ICD-10-CM | POA: Diagnosis not present

## 2017-02-15 DIAGNOSIS — M25512 Pain in left shoulder: Secondary | ICD-10-CM | POA: Diagnosis not present

## 2017-02-15 DIAGNOSIS — M5412 Radiculopathy, cervical region: Secondary | ICD-10-CM | POA: Diagnosis not present

## 2017-02-15 NOTE — Therapy (Signed)
[facility] [address] [place], [place], [address] Phone: [phone]   Fax:  [phone]  Physical Therapy Treatment  Patient Details  Name: Carrie Sanders MRN: [id number] Date of Birth: 03-Aug-1927 Referring Provider: Donalee Citrin  Encounter Date: 02/15/2017      PT End of Session - 02/15/17 1602    Visit Number 6   Number of Visits 12   Date for PT Re-Evaluation 02/25/17   Authorization - Visit Number 6   Authorization - Number of Visits 10   PT Start Time 1520   PT Stop Time 1558   PT Time Calculation (min) 38 min   Activity Tolerance Patient tolerated treatment well;No increased pain   Behavior During Therapy WFL for tasks assessed/performed      Past Medical History:  Diagnosis Date  . Arthritis   . Constipation   . Gait disorder 05/26/2016  . H/O left breast biopsy   . High cholesterol   . Hypertension   . Paresthesia 05/26/2016  . Pessary maintenance   . Sinus drainage   . Sleeping difficulty     Past Surgical History:  Procedure Laterality Date  . APPENDECTOMY     AGE 35  . CATARACTS REMOVED    . JOINT REPLACEMENT  1998   RT TOTAL HIP  . TOTAL HIP ARTHROPLASTY  03/17/2012   Procedure: TOTAL HIP ARTHROPLASTY ANTERIOR APPROACH;  Surgeon: Shelda Pal, MD;  Location: [facility];  Service: Orthopedics;  Laterality: Left;    There were no vitals filed for this visit.      Subjective Assessment - 02/15/17 1526    Subjective Pt states that the day she comes to therapy she can use her cane but the next day she is sore and can hardly move.  She is able to bath with her left arm again.    Pertinent History MRI C3-5 moderate disc narrowing; C56 severe, C6-7 moderate to severe narrowing, C7-T1 moderat facet , T1-T2 moderate to severe unable to operate due to age.     Currently in Pain? No/denies   Pain Onset More than a month ago                         [facility] Adult PT Treatment/Exercise - 02/15/17  0001      Neck Exercises: Theraband   Other Theraband Exercises decompression ex with t-band 1-5      Neck Exercises: Standing   Other Standing Exercises Postural t-band exercises for rows, scapular retraction and extension red x 10      Neck Exercises: Seated   Neck Retraction 10 reps   Other Seated Exercise scapular retraction      Neck Exercises: Supine   Cervical Isometrics Extension;Right lateral flexion;Left lateral flexion;3 secs;5 reps   Other Supine Exercise 1-5 decompression exercises   Other Supine Exercise Lt ER with 2# x 10      Manual Therapy   Manual Therapy Soft tissue mobilization   Manual therapy comments Manual complete separate rest of tx   Soft tissue mobilization muscle stripping and efflurage to L UT and cervical paraspinals to decrease soft tissue restrictions                  PT Short Term Goals - 02/10/17 1534      PT SHORT TERM GOAL #1   Title Pt cervical Rotation to increase 10 degrees both sides to allow pt to visit with friends when  seated on a bench.    Time 3   Period Weeks   Status Achieved     PT SHORT TERM GOAL #2   Title Pt trapezius mm spasm bilaterally to be decreased to minimal to moderate to take pressue off nerve so that pt stattes that her numbness in her UE is decreased by 20%   Time 3   Period Weeks   Status On-going     PT SHORT TERM GOAL #3   Title Pt LT shoulder ROM for flexion to be incrased 15 degrees to allow pt to don clothing and wash easier.    Time 3   Period Weeks   Status Achieved     PT SHORT TERM GOAL #4   Title Pt cervical pain to be no greater than a 6/10 to allow pt to complete 20 minutes of light house tasks in comfort.    Time 3   Period Weeks   Status Partially Met           PT Long Term Goals - 02/10/17 1534      PT LONG TERM GOAL #1   Title Pt  bilateral cervical rotation to be increased by 15 degrees to be able to visit longer with friends to either side of her    Time 6   Period  Weeks   Status On-going     PT LONG TERM GOAL #2   Title Pt left shoulder flexion to be increased by 30 degees to allow pt to place items in higher cabinets,(lower shelf of higher cabinets).   Time 6   Period Weeks   Status Achieved     PT LONG TERM GOAL #3   Title Pt cervical  pain to be no greater than a 4/10 to allow pt to complete 30 mintues of light house duties.   Time 6   Period Weeks   Status Partially Met     PT LONG TERM GOAL #4   Title PT mm spasm in traps to be decreased to mild to allow Pt numbness in UE to  decreased by 30% to allow pt to don clothing easier.   Time 6   Period Weeks   Status On-going               Plan - 02/15/17 1603    Clinical Impression Statement Added postural t-band exercises as well as supine Lt UE ER to routine.  Pt cervical ROM has improved as well as Lt shoulder.     Rehab Potential Good   PT Frequency 2x / week   PT Duration 6 weeks   PT Treatment/Interventions ADLs/Self Care Home Management;Moist Heat;Functional mobility training;Therapeutic activities;Therapeutic exercise;Patient/family education;Manual techniques;Passive range of motion   PT Next Visit Plan attempt wall push up for increased stability.     PT Home Exercise Plan 1 2 and 3 of decompression exercises, sitting scapular and cervical retraction. 02/11/2016: decompression 4-5; t6-band decompression exercises as well as isometric cervical exercises.    Consulted and Agree with Plan of Care Patient      Patient will benefit from skilled therapeutic intervention in order to improve the following deficits and impairments:  Decreased activity tolerance, Decreased balance, Decreased strength, Decreased range of motion, Decreased mobility, Difficulty walking, Pain, Increased muscle spasms, Impaired UE functional use  Visit Diagnosis: Radiculopathy, cervical region  Stiffness of left shoulder, not elsewhere classified     Problem List Patient Active Problem List    Diagnosis Date Noted  . Spinal  stenosis at L4-L5 level 07/24/2016  . Paresthesia 05/26/2016  . Gait disorder 05/26/2016  . S/P Left THA, AA 03/17/2012  . KNEE, ARTHRITIS, DEGEN./OSTEO 05/23/2009  . KNEE PAIN 05/23/2009  . ANSERINE BURSITIS, LEFT 05/23/2009  . TRIGGER FINGER 05/23/2009  . HAND PAIN 05/23/2009  . HIGH BLOOD PRESSURE 05/23/2009    Rayetta Humphrey, PT CLT [phone] 02/15/2017, 4:06 PM  [facility] [address] [place], [place], [address] Phone: [phone]   Fax:  [phone]  Name: Carrie Sanders MRN: [id number] Date of Birth: 1927-10-23

## 2017-02-17 ENCOUNTER — Ambulatory Visit (HOSPITAL_COMMUNITY): Payer: Medicare Other | Admitting: Physical Therapy

## 2017-02-17 DIAGNOSIS — M25612 Stiffness of left shoulder, not elsewhere classified: Secondary | ICD-10-CM | POA: Diagnosis not present

## 2017-02-17 DIAGNOSIS — M25512 Pain in left shoulder: Secondary | ICD-10-CM | POA: Diagnosis not present

## 2017-02-17 DIAGNOSIS — M5412 Radiculopathy, cervical region: Secondary | ICD-10-CM

## 2017-02-17 DIAGNOSIS — G8929 Other chronic pain: Secondary | ICD-10-CM | POA: Diagnosis not present

## 2017-02-17 NOTE — Therapy (Signed)
[place], [place], [address] Phone: [phone]   Fax:  [phone]  Physical Therapy Treatment  Patient Details  Name: Carrie Sanders MRN: [id number] Date of Birth: 21-Sep-1927 Referring Provider: Kary Kos  Encounter Date: 02/17/2017      PT End of Session - 02/17/17 1519    Visit Number 7   Number of Visits 12   Date for PT Re-Evaluation 02/25/17   Authorization - Visit Number 7   Authorization - Number of Visits 10   PT Start Time 6712   PT Stop Time 4580   PT Time Calculation (min) 41 min   Activity Tolerance Patient tolerated treatment well;No increased pain   Behavior During Therapy WFL for tasks assessed/performed      Past Medical History:  Diagnosis Date  . Arthritis   . Constipation   . Gait disorder 05/26/2016  . H/O left breast biopsy   . High cholesterol   . Hypertension   . Paresthesia 05/26/2016  . Pessary maintenance   . Sinus drainage   . Sleeping difficulty     Past Surgical History:  Procedure Laterality Date  . APPENDECTOMY     AGE 80  . CATARACTS REMOVED    . JOINT REPLACEMENT  1998   RT TOTAL HIP  . TOTAL HIP ARTHROPLASTY  03/17/2012   Procedure: TOTAL HIP ARTHROPLASTY ANTERIOR APPROACH;  Surgeon: Mauri Pole, MD;  Location: [facility];  Service: Orthopedics;  Laterality: Left;    There were no vitals filed for this visit.      Subjective Assessment - 02/17/17 1515    Subjective Carrie Sanders states that she can't believe how much better she can move her left arm.  She can also tell that her strength is better in her arms and her pain is much better in her neck.    Pertinent History MRI C3-5 moderate disc narrowing; C56 severe, C6-7 moderate to severe narrowing, C7-T1 moderat facet , T1-T2 moderate to severe unable to operate due to age.     Currently in Pain? No/denies   Pain Onset More than a month ago                [facility] Adult PT Treatment/Exercise -  02/17/17 0001      Exercises   Exercises Neck;Shoulder     Neck Exercises: Theraband   Scapula Retraction 10 reps;Green   Shoulder Extension 10 reps;Green   Rows 10 reps;Green   Other Theraband Exercises --     Neck Exercises: Standing   Wall Push Ups 10 reps   Other Standing Exercises --     Neck Exercises: Seated   Cervical Isometrics Extension;10 reps   Neck Retraction 10 reps   Cervical Rotation 10 reps   Lateral Flexion 10 reps   X to V 10 reps   W Back 10 reps   Shoulder Rolls Backwards;10 reps   Other Seated Exercise scapular retraction    Other Seated Exercise ER B with 2#; upper back isometric x 10      Neck Exercises: Supine   Cervical Isometrics --   Other Supine Exercise --   Other Supine Exercise --     Manual Therapy   Manual Therapy Soft tissue mobilization;Manual Traction   Manual therapy comments Manual complete separate rest of tx   Soft tissue mobilization muscle stripping and efflurage to L UT and cervical paraspinals to decrease soft tissue restrictions   Manual Traction while seated  10second hold x 5.      Neck Exercises: Stretches   Upper Trapezius Stretch 2 reps;20 seconds                  PT Short Term Goals - 02/10/17 1534      PT SHORT TERM GOAL #1   Title Pt cervical Rotation to increase 10 degrees both sides to allow pt to visit with friends when seated on a bench.    Time 3   Period Weeks   Status Achieved     PT SHORT TERM GOAL #2   Title Pt trapezius mm spasm bilaterally to be decreased to minimal to moderate to take pressue off nerve so that pt stattes that her numbness in her UE is decreased by 20%   Time 3   Period Weeks   Status On-going     PT SHORT TERM GOAL #3   Title Pt LT shoulder ROM for flexion to be incrased 15 degrees to allow pt to don clothing and wash easier.    Time 3   Period Weeks   Status Achieved     PT SHORT TERM GOAL #4   Title Pt cervical pain to be no greater than a 6/10 to allow pt to  complete 20 minutes of light house tasks in comfort.    Time 3   Period Weeks   Status Partially Met           PT Long Term Goals - 02/10/17 1534      PT LONG TERM GOAL #1   Title Pt  bilateral cervical rotation to be increased by 15 degrees to be able to visit longer with friends to either side of her    Time 6   Period Weeks   Status On-going     PT LONG TERM GOAL #2   Title Pt left shoulder flexion to be increased by 30 degees to allow pt to place items in higher cabinets,(lower shelf of higher cabinets).   Time 6   Period Weeks   Status Achieved     PT LONG TERM GOAL #3   Title Pt cervical  pain to be no greater than a 4/10 to allow pt to complete 30 mintues of light house duties.   Time 6   Period Weeks   Status Partially Met     PT LONG TERM GOAL #4   Title PT mm spasm in traps to be decreased to mild to allow Pt numbness in UE to  decreased by 30% to allow pt to don clothing easier.   Time 6   Period Weeks   Status On-going               Plan - 02/17/17 1520    Clinical Impression Statement Added wall push up , W back, x to V and upper back isometric with good technique noted after verbal cuing.   Pt improving in all regards noting increased UE strength.    Rehab Potential Good   PT Frequency 2x / week   PT Duration 6 weeks   PT Treatment/Interventions ADLs/Self Care Home Management;Moist Heat;Functional mobility training;Therapeutic activities;Therapeutic exercise;Patient/family education;Manual techniques;Passive range of motion   PT Next Visit Plan begin wall arch exercises; update HEP    PT Home Exercise Plan 1 2 and 3 of decompression exercises, sitting scapular and cervical retraction. 02/11/2016: decompression 4-5; t6-band decompression exercises as well as isometric cervical exercises.    Consulted and Agree with Plan of Care  Patient      Patient will benefit from skilled therapeutic intervention in order to improve the following deficits and  impairments:  Decreased activity tolerance, Decreased balance, Decreased strength, Decreased range of motion, Decreased mobility, Difficulty walking, Pain, Increased muscle spasms, Impaired UE functional use  Visit Diagnosis: Radiculopathy, cervical region  Stiffness of left shoulder, not elsewhere classified     Problem List Patient Active Problem List   Diagnosis Date Noted  . Spinal stenosis at L4-L5 level 07/24/2016  . Paresthesia 05/26/2016  . Gait disorder 05/26/2016  . S/P Left THA, AA 03/17/2012  . KNEE, ARTHRITIS, DEGEN./OSTEO 05/23/2009  . KNEE PAIN 05/23/2009  . ANSERINE BURSITIS, LEFT 05/23/2009  . TRIGGER FINGER 05/23/2009  . HAND PAIN 05/23/2009  . HIGH BLOOD PRESSURE 05/23/2009    Rayetta Humphrey, PT CLT [phone] 02/17/2017, 3:22 PM  [place] [address] [place], [place], [address] Phone: [phone]   Fax:  [phone]  Name: Carrie Sanders MRN: [id number] Date of Birth: Dec 21, 1926

## 2017-02-22 ENCOUNTER — Ambulatory Visit (HOSPITAL_COMMUNITY): Payer: Medicare Other | Admitting: Physical Therapy

## 2017-02-22 DIAGNOSIS — M25612 Stiffness of left shoulder, not elsewhere classified: Secondary | ICD-10-CM | POA: Diagnosis not present

## 2017-02-22 DIAGNOSIS — G8929 Other chronic pain: Secondary | ICD-10-CM

## 2017-02-22 DIAGNOSIS — M25512 Pain in left shoulder: Secondary | ICD-10-CM | POA: Diagnosis not present

## 2017-02-22 DIAGNOSIS — M5412 Radiculopathy, cervical region: Secondary | ICD-10-CM

## 2017-02-22 NOTE — Therapy (Signed)
[place], [place], [address] Phone: [phone]   Fax:  [phone]  Physical Therapy Treatment  Patient Details  Name: Carrie Sanders MRN: [id number] Date of Birth: 1927/08/13 Referring Provider: Kary Kos  Encounter Date: 02/22/2017      PT End of Session - 02/22/17 1515    Visit Number 8   Number of Visits 12   Date for PT Re-Evaluation 02/25/17   Authorization - Visit Number 8   Authorization - Number of Visits 10   PT Start Time 5056   PT Stop Time 9794   PT Time Calculation (min) 42 min   Activity Tolerance Patient tolerated treatment well;No increased pain   Behavior During Therapy WFL for tasks assessed/performed      Past Medical History:  Diagnosis Date  . Arthritis   . Constipation   . Gait disorder 05/26/2016  . H/O left breast biopsy   . High cholesterol   . Hypertension   . Paresthesia 05/26/2016  . Pessary maintenance   . Sinus drainage   . Sleeping difficulty     Past Surgical History:  Procedure Laterality Date  . APPENDECTOMY     AGE 63  . CATARACTS REMOVED    . JOINT REPLACEMENT  1998   RT TOTAL HIP  . TOTAL HIP ARTHROPLASTY  03/17/2012   Procedure: TOTAL HIP ARTHROPLASTY ANTERIOR APPROACH;  Surgeon: Mauri Pole, MD;  Location: [facility];  Service: Orthopedics;  Laterality: Left;    There were no vitals filed for this visit.      Subjective Assessment - 02/22/17 1433    Subjective PT states that she is doing her exercises. She is not having any pain at this time.    Pertinent History MRI C3-5 moderate disc narrowing; C56 severe, C6-7 moderate to severe narrowing, C7-T1 moderat facet , T1-T2 moderate to severe unable to operate due to age.     Currently in Pain? No/denies   Pain Onset More than a month ago                [facility] Adult PT Treatment/Exercise - 02/22/17 0001      Exercises   Exercises Neck;Shoulder     Neck Exercises: Theraband   Scapula  Retraction --   Shoulder Extension 10 reps;Green   Rows 10 reps;Green     Neck Exercises: Standing   Wall Push Ups 10 reps   Other Standing Exercises wall arch    Other Standing Exercises No AD standing head turns x 10; B UE flexion x 10     Neck Exercises: Seated   Cervical Isometrics --   Neck Retraction 10 reps   Cervical Rotation 10 reps   Lateral Flexion 10 reps   X to V 10 reps   W Back 10 reps   W Back Weights (lbs) 2   Shoulder Rolls Backwards;10 reps   Other Seated Exercise scapular retraction    Other Seated Exercise ER B with 2#; upper back isometric x 10      Manual Therapy   Manual Therapy Soft tissue mobilization;Manual Traction   Manual therapy comments Manual complete separate rest of tx   Soft tissue mobilization muscle stripping and efflurage to L UT and cervical paraspinals to decrease soft tissue restrictions   Manual Traction while seated 10second hold x 5.      Neck Exercises: Stretches   Upper Trapezius Stretch 2 reps;20 seconds  PT Short Term Goals - 02/10/17 1534      PT SHORT TERM GOAL #1   Title Pt cervical Rotation to increase 10 degrees both sides to allow pt to visit with friends when seated on a bench.    Time 3   Period Weeks   Status Achieved     PT SHORT TERM GOAL #2   Title Pt trapezius mm spasm bilaterally to be decreased to minimal to moderate to take pressue off nerve so that pt stattes that her numbness in her UE is decreased by 20%   Time 3   Period Weeks   Status On-going     PT SHORT TERM GOAL #3   Title Pt LT shoulder ROM for flexion to be incrased 15 degrees to allow pt to don clothing and wash easier.    Time 3   Period Weeks   Status Achieved     PT SHORT TERM GOAL #4   Title Pt cervical pain to be no greater than a 6/10 to allow pt to complete 20 minutes of light house tasks in comfort.    Time 3   Period Weeks   Status Partially Met           PT Long Term Goals - 02/10/17 1534       PT LONG TERM GOAL #1   Title Pt  bilateral cervical rotation to be increased by 15 degrees to be able to visit longer with friends to either side of her    Time 6   Period Weeks   Status On-going     PT LONG TERM GOAL #2   Title Pt left shoulder flexion to be increased by 30 degees to allow pt to place items in higher cabinets,(lower shelf of higher cabinets).   Time 6   Period Weeks   Status Achieved     PT LONG TERM GOAL #3   Title Pt cervical  pain to be no greater than a 4/10 to allow pt to complete 30 mintues of light house duties.   Time 6   Period Weeks   Status Partially Met     PT LONG TERM GOAL #4   Title PT mm spasm in traps to be decreased to mild to allow Pt numbness in UE to  decreased by 30% to allow pt to don clothing easier.   Time 6   Period Weeks   Status On-going               Plan - 02/22/17 1515    Clinical Impression Statement Added standing cervical and shoulder motion without AD to address balance as well, Began standing wall arch with good technique.  HEP updated.     Rehab Potential Good   PT Frequency 2x / week   PT Duration 6 weeks   PT Treatment/Interventions ADLs/Self Care Home Management;Moist Heat;Functional mobility training;Therapeutic activities;Therapeutic exercise;Patient/family education;Manual techniques;Passive range of motion   PT Next Visit Plan continue standing cervical exercises to address balance as well.     PT Home Exercise Plan 1 2 and 3 of decompression exercises, sitting scapular and cervical retraction. 02/11/2016: decompression 4-5; t6-band decompression exercises as well as isometric cervical exercises.    Consulted and Agree with Plan of Care Patient      Patient will benefit from skilled therapeutic intervention in order to improve the following deficits and impairments:  Decreased activity tolerance, Decreased balance, Decreased strength, Decreased range of motion, Decreased mobility, Difficulty walking, Pain,  Increased  muscle spasms, Impaired UE functional use  Visit Diagnosis: Radiculopathy, cervical region  Stiffness of left shoulder, not elsewhere classified  Chronic left shoulder pain     Problem List Patient Active Problem List   Diagnosis Date Noted  . Spinal stenosis at L4-L5 level 07/24/2016  . Paresthesia 05/26/2016  . Gait disorder 05/26/2016  . S/P Left THA, AA 03/17/2012  . KNEE, ARTHRITIS, DEGEN./OSTEO 05/23/2009  . KNEE PAIN 05/23/2009  . ANSERINE BURSITIS, LEFT 05/23/2009  . TRIGGER FINGER 05/23/2009  . HAND PAIN 05/23/2009  . HIGH BLOOD PRESSURE 05/23/2009    Rayetta Humphrey, PT CLT [phone] 02/22/2017, 3:17 PM  [place] [address] [place], [place], [address] Phone: [phone]   Fax:  [phone]  Name: Carrie Sanders MRN: [id number] Date of Birth: [birth year]

## 2017-02-22 NOTE — Patient Instructions (Addendum)
Scapular Retraction: Elbow Flexion (Standing)    With elbows bent to 90, pinch shoulder blades together and rotate arms out, keeping elbows bent. Repeat _10___ times per set. Do ___1_ sets per session. Do __2__ sessions per day.  http://orth.exer.us/948   Copyright  VHI. All rights reserved.  AROM: Lateral Neck Flexion    Slowly tilt head toward one shoulder, then the other. Hold each position ___3_ seconds. Repeat _5___ times per set. Do 1____ sets per session. Do _3___ sessions per day.  http://orth.exer.us/296   Copyright  VHI. All rights reserved.  Flexibility: Corner Stretch    Standing in corner with hands just above shoulder level and feet __12__ inches from corner, lean forward until a comfortable stretch is felt across chest. Hold __3__ seconds. Repeat __10__ times per set. Do ____ sets per session. Do __2__ sessions per day. 1 http://orth.exer.us/342   Copyright  VHI. All rights reserved.  ROM: Flexion - Wand   Sitting   Bring wand directly over head, leading with right side. Reach back until stretch is felt. Hold __5__ seconds. Repeat __10__ times per set. Do __1__ sets per session. Do __2__ sessions per day.  http://orth.exer.us/744   Copyright  VHI. All rights reserved.

## 2017-02-24 ENCOUNTER — Ambulatory Visit (HOSPITAL_COMMUNITY): Payer: Medicare Other | Admitting: Physical Therapy

## 2017-02-24 DIAGNOSIS — M5412 Radiculopathy, cervical region: Secondary | ICD-10-CM

## 2017-02-24 DIAGNOSIS — M25512 Pain in left shoulder: Secondary | ICD-10-CM | POA: Diagnosis not present

## 2017-02-24 DIAGNOSIS — G8929 Other chronic pain: Secondary | ICD-10-CM

## 2017-02-24 DIAGNOSIS — M25612 Stiffness of left shoulder, not elsewhere classified: Secondary | ICD-10-CM

## 2017-02-24 NOTE — Therapy (Signed)
[place], [place], [address] Phone: [phone]   Fax:  [phone]  Physical Therapy Treatment  Patient Details  Name: Carrie Sanders MRN: [id number] Date of Birth: [birth year] Referring Provider: Kary Kos  Encounter Date: 02/24/2017      PT End of Session - 02/24/17 1514    Visit Number 9   Number of Visits 12   Date for PT Re-Evaluation 02/25/17   Authorization - Visit Number 9   Authorization - Number of Visits 10   PT Start Time 1430   PT Stop Time 1510   PT Time Calculation (min) 40 min   Activity Tolerance Patient tolerated treatment well;No increased pain   Behavior During Therapy WFL for tasks assessed/performed      Past Medical History:  Diagnosis Date  . Arthritis   . Constipation   . Gait disorder 05/26/2016  . H/O left breast biopsy   . High cholesterol   . Hypertension   . Paresthesia 05/26/2016  . Pessary maintenance   . Sinus drainage   . Sleeping difficulty     Past Surgical History:  Procedure Laterality Date  . APPENDECTOMY     AGE 83  . CATARACTS REMOVED    . JOINT REPLACEMENT  1998   RT TOTAL HIP  . TOTAL HIP ARTHROPLASTY  03/17/2012   Procedure: TOTAL HIP ARTHROPLASTY ANTERIOR APPROACH;  Surgeon: Mauri Pole, MD;  Location: [facility];  Service: Orthopedics;  Laterality: Left;    There were no vitals filed for this visit.      Subjective Assessment - 02/24/17 1510    Subjective Pt states that she continues to work on her exercises at home.  She is amazed how much better her arm feels and moves    Pertinent History MRI C3-5 moderate disc narrowing; C56 severe, C6-7 moderate to severe narrowing, C7-T1 moderat facet , T1-T2 moderate to severe unable to operate due to age.     Currently in Pain? No/denies   Pain Onset More than a month ago                         [facility] Adult PT Treatment/Exercise - 02/24/17 0001      Exercises   Exercises  Neck;Shoulder     Neck Exercises: Theraband   Shoulder Extension --   Rows --     Neck Exercises: Standing   Wall Push Ups 10 reps   Other Standing Exercises wall arch    Other Standing Exercises semi tandem stance with UE ER x 10; no AD standing narrow base of support  head turns x 10; B UE flexion x 10     Neck Exercises: Seated   Neck Retraction 10 reps   Neck Retraction Limitations with green t-band    Cervical Rotation 10 reps   Lateral Flexion 10 reps   X to V 10 reps   X to V Weights (lbs) 1   W Back 10 reps   W Back Weights (lbs) 1   Shoulder Rolls Backwards;10 reps   Other Seated Exercise --   Other Seated Exercise --     Neck Exercises: Supine   Cervical Isometrics Extension;Right lateral flexion;Left lateral flexion;10 reps   Neck Retraction 10 reps   Other Supine Exercise chest press x 10      Manual Therapy   Manual Therapy Soft tissue mobilization;Manual Traction   Manual therapy comments Manual complete separate  rest of tx   Soft tissue mobilization muscle stripping and efflurage to L UT and cervical paraspinals to decrease soft tissue restrictions   Manual Traction while seated 10second hold x 5.      Neck Exercises: Stretches   Upper Trapezius Stretch --                  PT Short Term Goals - 02/10/17 1534      PT SHORT TERM GOAL #1   Title Pt cervical Rotation to increase 10 degrees both sides to allow pt to visit with friends when seated on a bench.    Time 3   Period Weeks   Status Achieved     PT SHORT TERM GOAL #2   Title Pt trapezius mm spasm bilaterally to be decreased to minimal to moderate to take pressue off nerve so that pt stattes that her numbness in her UE is decreased by 20%   Time 3   Period Weeks   Status On-going     PT SHORT TERM GOAL #3   Title Pt LT shoulder ROM for flexion to be incrased 15 degrees to allow pt to don clothing and wash easier.    Time 3   Period Weeks   Status Achieved     PT SHORT TERM GOAL  #4   Title Pt cervical pain to be no greater than a 6/10 to allow pt to complete 20 minutes of light house tasks in comfort.    Time 3   Period Weeks   Status Partially Met           PT Long Term Goals - 02/10/17 1534      PT LONG TERM GOAL #1   Title Pt  bilateral cervical rotation to be increased by 15 degrees to be able to visit longer with friends to either side of her    Time 6   Period Weeks   Status On-going     PT LONG TERM GOAL #2   Title Pt left shoulder flexion to be increased by 30 degees to allow pt to place items in higher cabinets,(lower shelf of higher cabinets).   Time 6   Period Weeks   Status Achieved     PT LONG TERM GOAL #3   Title Pt cervical  pain to be no greater than a 4/10 to allow pt to complete 30 mintues of light house duties.   Time 6   Period Weeks   Status Partially Met     PT LONG TERM GOAL #4   Title PT mm spasm in traps to be decreased to mild to allow Pt numbness in UE to  decreased by 30% to allow pt to don clothing easier.   Time 6   Period Weeks   Status On-going               Plan - 02/24/17 1515    Clinical Impression Statement Pt cervical ROM and shoulder ROM continues to improve; pt pain is significantly better.  Added more standing activity to challenge balance.  Pt urged to obtain an order for balance from MD.    Rehab Potential Good   PT Frequency 2x / week   PT Duration 6 weeks   PT Treatment/Interventions ADLs/Self Care Home Management;Moist Heat;Functional mobility training;Therapeutic activities;Therapeutic exercise;Patient/family education;Manual techniques;Passive range of motion   PT Next Visit Plan PT will need g-code and reassessment next treatment.    PT Home Exercise Plan 1 2 and 3  of decompression exercises, sitting scapular and cervical retraction. 02/11/2016: decompression 4-5; t6-band decompression exercises as well as isometric cervical exercises.    Consulted and Agree with Plan of Care Patient       Patient will benefit from skilled therapeutic intervention in order to improve the following deficits and impairments:  Decreased activity tolerance, Decreased balance, Decreased strength, Decreased range of motion, Decreased mobility, Difficulty walking, Pain, Increased muscle spasms, Impaired UE functional use  Visit Diagnosis: Radiculopathy, cervical region  Stiffness of left shoulder, not elsewhere classified  Chronic left shoulder pain     Problem List Patient Active Problem List   Diagnosis Date Noted  . Spinal stenosis at L4-L5 level 07/24/2016  . Paresthesia 05/26/2016  . Gait disorder 05/26/2016  . S/P Left THA, AA 03/17/2012  . KNEE, ARTHRITIS, DEGEN./OSTEO 05/23/2009  . KNEE PAIN 05/23/2009  . ANSERINE BURSITIS, LEFT 05/23/2009  . TRIGGER FINGER 05/23/2009  . HAND PAIN 05/23/2009  . HIGH BLOOD PRESSURE 05/23/2009  Rayetta Humphrey, PT CLT [phone] 02/24/2017, 3:17 PM  [place] [address] [place], [place], [address] Phone: [phone]   Fax:  [phone]  Name: Carrie Sanders MRN: [id number] Date of Birth: 09/29/1927

## 2017-03-01 ENCOUNTER — Ambulatory Visit (HOSPITAL_COMMUNITY): Payer: Medicare Other | Attending: Neurosurgery | Admitting: Physical Therapy

## 2017-03-01 DIAGNOSIS — M25612 Stiffness of left shoulder, not elsewhere classified: Secondary | ICD-10-CM

## 2017-03-01 DIAGNOSIS — M25512 Pain in left shoulder: Secondary | ICD-10-CM | POA: Insufficient documentation

## 2017-03-01 DIAGNOSIS — M5412 Radiculopathy, cervical region: Secondary | ICD-10-CM | POA: Diagnosis not present

## 2017-03-01 DIAGNOSIS — G8929 Other chronic pain: Secondary | ICD-10-CM | POA: Diagnosis not present

## 2017-03-01 NOTE — Therapy (Addendum)
[place], [place], [address] Phone: [phone]   Fax:  [phone]  Physical Therapy Treatment  Patient Details  Name: Carrie Sanders MRN: [id number] Date of Birth: 1927-06-06 Referring Provider: Kary Kos   Encounter Date: 03/01/2017      PT End of Session - 03/01/17 1513    Visit Number 10   Number of Visits 10   Date for PT Re-Evaluation 02/25/17   Authorization - Visit Number 10   Authorization - Number of Visits 10   PT Start Time 3276   PT Stop Time 1510   PT Time Calculation (min) 35 min   Activity Tolerance Patient tolerated treatment well;No increased pain   Behavior During Therapy WFL for tasks assessed/performed      Past Medical History:  Diagnosis Date  . Arthritis   . Constipation   . Gait disorder 05/26/2016  . H/O left breast biopsy   . High cholesterol   . Hypertension   . Paresthesia 05/26/2016  . Pessary maintenance   . Sinus drainage   . Sleeping difficulty     Past Surgical History:  Procedure Laterality Date  . APPENDECTOMY     AGE 12  . CATARACTS REMOVED    . JOINT REPLACEMENT  1998   RT TOTAL HIP  . TOTAL HIP ARTHROPLASTY  03/17/2012   Procedure: TOTAL HIP ARTHROPLASTY ANTERIOR APPROACH;  Surgeon: Mauri Pole, MD;  Location: [facility];  Service: Orthopedics;  Laterality: Left;    There were no vitals filed for this visit.      Subjective Assessment - 03/01/17 1439    Subjective Pt states that the next morning after therapy she got up and there was a "pop" and she had increased pain and swelling.  Her neck and arms are feeling better.    Pertinent History MRI C3-5 moderate disc narrowing; C56 severe, C6-7 moderate to severe narrowing, C7-T1 moderat facet , T1-T2 moderate to severe unable to operate due to age.     How long can you sit comfortably? no problem    Currently in Pain? No/denies  no pain in her neck; arm pain with end of motion.    Pain Onset More than a  month ago            [facility] PT Assessment - 03/01/17 0001      Assessment   Medical Diagnosis cervicalagia   Referring Provider Kary Kos    Onset Date/Surgical Date --  chronic    Hand Dominance Right   Next MD Visit no return visit    Prior Therapy none     Precautions   Precautions None     Restrictions   Weight Bearing Restrictions No     Balance Screen   Has the patient fallen in the past 6 months No   Has the patient had a decrease in activity level because of a fear of falling?  Yes   Is the patient reluctant to leave their home because of a fear of falling?  No     Prior Function   Level of Independence Independent with community mobility with device  with rolling walker   Vocation Retired   Leisure taking care of [employer]     Cognition   Overall Cognitive Status Within Functional Limits for tasks assessed     Posture/Postural Control   Posture/Postural Control Postural limitations   Postural Limitations Rounded Shoulders;Forward head     AROM  Left Shoulder Flexion 135 Degrees  tested sitting was 115    Left Shoulder ABduction --  was 122 now 112 sitting   Cervical Flexion 72  was 60    Cervical Extension 62  was 40    Cervical - Right Side Bend 31 reps causes no symptoms   was 20   Cervical - Left Side Bend 45 reps causes increased pain.   was 35    Cervical - Right Rotation 60  was40   Cervical - Left Rotation 50  was 40     Strength   Cervical Extension 5/5  was 4-   Cervical - Right Side Bend 5/5  was 4-   Cervical - Left Side Bend 5/5  was 4-     Palpation   Palpation comment mild mm spasm B upper trap area   was moderate                     [facility] Adult PT Treatment/Exercise - 03/01/17 0001      Neck Exercises: Seated   Neck Retraction 10 reps   Shoulder Rolls Backwards;10 reps   Other Seated Exercise scapular retraction      Manual Therapy   Manual Therapy Soft tissue mobilization   Manual therapy comments  Manual complete separate rest of tx   Soft tissue mobilization muscle stripping and efflurage to L UT and cervical paraspinals to decrease soft tissue restrictions                  PT Short Term Goals - 03/01/17 1500      PT SHORT TERM GOAL #1   Title Pt cervical Rotation to increase 10 degrees both sides to allow pt to visit with friends when seated on a bench.    Time 3   Period Weeks   Status Achieved     PT SHORT TERM GOAL #2   Title Pt trapezius mm spasm bilaterally to be decreased to minimal to moderate to take pressue off nerve so that pt stattes that her numbness in her UE is decreased by 20%   Time 3   Period Weeks   Status Achieved     PT SHORT TERM GOAL #3   Title Pt LT shoulder ROM for flexion to be incrased 15 degrees to allow pt to don clothing and wash easier.    Time 3   Period Weeks   Status Achieved     PT SHORT TERM GOAL #4   Title Pt cervical pain to be no greater than a 6/10 to allow pt to complete 20 minutes of light house tasks in comfort.    Time 3   Period Weeks   Status Achieved           PT Long Term Goals - 03/01/17 1501      PT LONG TERM GOAL #1   Title Pt  bilateral cervical rotation to be increased by 15 degrees to be able to visit longer with friends to either side of her    Time 6   Period Weeks   Status Achieved     PT LONG TERM GOAL #2   Title Pt left shoulder flexion to be increased by 30 degees to allow pt to place items in higher cabinets,(lower shelf of higher cabinets).   Time 6   Period Weeks   Status Partially Met     PT LONG TERM GOAL #3   Title Pt cervical  pain to be  no greater than a 4/10 to allow pt to complete 30 mintues of light house duties.   Time 6   Period Weeks   Status Achieved     PT LONG TERM GOAL #4   Title PT mm spasm in traps to be decreased to mild to allow Pt numbness in UE to  decreased by 30% to allow pt to don clothing easier.   Time 6   Period Weeks   Status Partially Met                Plan - Mar 22, 2017 1515    Clinical Impression Statement [name] was reassessed today with pt being pleased with functional outcome and feels that she is ready for discharge.  Pt has improved in ROM, strength and pain.  All short term goals are met and 3/4 long term goals have been met.  Pt is ready for discharge.    Rehab Potential Good   PT Frequency 2x / week   PT Duration 6 weeks   PT Treatment/Interventions ADLs/Self Care Home Management;Moist Heat;Functional mobility training;Therapeutic activities;Therapeutic exercise;Patient/family education;Manual techniques;Passive range of motion   PT Next Visit Plan Discharge pt to HEP    PT Home Exercise Plan 1 2 and 3 of decompression exercises, sitting scapular and cervical retraction. 02/11/2016: decompression 4-5; t6-band decompression exercises as well as isometric cervical exercises.    Consulted and Agree with Plan of Care Patient      Patient will benefit from skilled therapeutic intervention in order to improve the following deficits and impairments:  Decreased activity tolerance, Decreased balance, Decreased strength, Decreased range of motion, Decreased mobility, Difficulty walking, Pain, Increased muscle spasms, Impaired UE functional use  Visit Diagnosis: Radiculopathy, cervical region  Stiffness of left shoulder, not elsewhere classified  Chronic left shoulder pain       G-Codes - 22-Mar-2017 1509    Functional Assessment Tool Used (Outpatient Only) ROM, strength, clinical judgement    Functional Limitation Self care   Self Care Goal Status (F6384) At least 40 percent but less than 60 percent impaired, limited or restricted   Self Care Discharge Status [phone]) At least 20 percent but less than 40 percent impaired, limited or restricted      Problem List Patient Active Problem List   Diagnosis Date Noted  . Spinal stenosis at L4-L5 level 07/24/2016  . Paresthesia 05/26/2016  . Gait disorder 05/26/2016   . S/P Left THA, AA 03/17/2012  . KNEE, ARTHRITIS, DEGEN./OSTEO 05/23/2009  . KNEE PAIN 05/23/2009  . ANSERINE BURSITIS, LEFT 05/23/2009  . TRIGGER FINGER 05/23/2009  . HAND PAIN 05/23/2009  . HIGH BLOOD PRESSURE 05/23/2009    Rayetta Humphrey, PT CLT [phone] March 22, 2017, 3:18 PM  [place] [address] [place], [place], [address] Phone: [phone]   Fax:  [phone]  Name: Carrie Sanders MRN: [id number] Date of Birth: 04-29-1927  PHYSICAL THERAPY DISCHARGE SUMMARY  Visits from Start of Care: 10  Current functional level related to goals / functional outcomes: See above   Remaining deficits: See above    Education / Equipment: HEP Plan: Patient agrees to discharge.  Patient goals were met. Patient is being discharged due to meeting the stated rehab goals.  ?????        Rayetta Humphrey, [place] CLT [phone]

## 2017-03-03 ENCOUNTER — Ambulatory Visit (HOSPITAL_COMMUNITY): Payer: Medicare Other | Admitting: Physical Therapy

## 2017-03-08 ENCOUNTER — Ambulatory Visit (HOSPITAL_COMMUNITY): Payer: Medicare Other | Admitting: Physical Therapy

## 2017-03-10 ENCOUNTER — Encounter (HOSPITAL_COMMUNITY): Payer: Medicare Other | Admitting: Physical Therapy

## 2017-03-11 DIAGNOSIS — M19042 Primary osteoarthritis, left hand: Secondary | ICD-10-CM | POA: Diagnosis not present

## 2017-03-11 DIAGNOSIS — M19041 Primary osteoarthritis, right hand: Secondary | ICD-10-CM | POA: Diagnosis not present

## 2017-03-11 DIAGNOSIS — M79641 Pain in right hand: Secondary | ICD-10-CM | POA: Diagnosis not present

## 2017-03-11 DIAGNOSIS — G5603 Carpal tunnel syndrome, bilateral upper limbs: Secondary | ICD-10-CM | POA: Diagnosis not present

## 2017-03-11 DIAGNOSIS — M79642 Pain in left hand: Secondary | ICD-10-CM | POA: Diagnosis not present

## 2017-03-17 DIAGNOSIS — E782 Mixed hyperlipidemia: Secondary | ICD-10-CM | POA: Diagnosis not present

## 2017-03-17 DIAGNOSIS — I10 Essential (primary) hypertension: Secondary | ICD-10-CM | POA: Diagnosis not present

## 2017-03-19 DIAGNOSIS — Z6826 Body mass index (BMI) 26.0-26.9, adult: Secondary | ICD-10-CM | POA: Diagnosis not present

## 2017-03-19 DIAGNOSIS — K219 Gastro-esophageal reflux disease without esophagitis: Secondary | ICD-10-CM | POA: Diagnosis not present

## 2017-03-19 DIAGNOSIS — M545 Low back pain: Secondary | ICD-10-CM | POA: Diagnosis not present

## 2017-03-19 DIAGNOSIS — Z0000 Encounter for general adult medical examination without abnormal findings: Secondary | ICD-10-CM | POA: Diagnosis not present

## 2017-03-19 DIAGNOSIS — N182 Chronic kidney disease, stage 2 (mild): Secondary | ICD-10-CM | POA: Diagnosis not present

## 2017-03-19 DIAGNOSIS — G5600 Carpal tunnel syndrome, unspecified upper limb: Secondary | ICD-10-CM | POA: Diagnosis not present

## 2017-03-19 DIAGNOSIS — M25559 Pain in unspecified hip: Secondary | ICD-10-CM | POA: Diagnosis not present

## 2017-03-19 DIAGNOSIS — D649 Anemia, unspecified: Secondary | ICD-10-CM | POA: Diagnosis not present

## 2017-03-19 DIAGNOSIS — I10 Essential (primary) hypertension: Secondary | ICD-10-CM | POA: Diagnosis not present

## 2017-03-22 DIAGNOSIS — G5601 Carpal tunnel syndrome, right upper limb: Secondary | ICD-10-CM | POA: Diagnosis not present

## 2017-06-25 ENCOUNTER — Other Ambulatory Visit: Payer: Self-pay

## 2017-09-16 DIAGNOSIS — I10 Essential (primary) hypertension: Secondary | ICD-10-CM | POA: Diagnosis not present

## 2017-09-20 DIAGNOSIS — N182 Chronic kidney disease, stage 2 (mild): Secondary | ICD-10-CM | POA: Diagnosis not present

## 2017-09-20 DIAGNOSIS — K219 Gastro-esophageal reflux disease without esophagitis: Secondary | ICD-10-CM | POA: Diagnosis not present

## 2017-09-20 DIAGNOSIS — M545 Low back pain: Secondary | ICD-10-CM | POA: Diagnosis not present

## 2017-09-20 DIAGNOSIS — M25552 Pain in left hip: Secondary | ICD-10-CM | POA: Diagnosis not present

## 2017-09-20 DIAGNOSIS — G5600 Carpal tunnel syndrome, unspecified upper limb: Secondary | ICD-10-CM | POA: Diagnosis not present

## 2017-09-20 DIAGNOSIS — M48061 Spinal stenosis, lumbar region without neurogenic claudication: Secondary | ICD-10-CM | POA: Diagnosis not present

## 2017-09-20 DIAGNOSIS — D509 Iron deficiency anemia, unspecified: Secondary | ICD-10-CM | POA: Diagnosis not present

## 2017-09-20 DIAGNOSIS — M25562 Pain in left knee: Secondary | ICD-10-CM | POA: Diagnosis not present

## 2017-09-20 DIAGNOSIS — I10 Essential (primary) hypertension: Secondary | ICD-10-CM | POA: Diagnosis not present

## 2017-09-20 DIAGNOSIS — M4802 Spinal stenosis, cervical region: Secondary | ICD-10-CM | POA: Diagnosis not present

## 2017-09-20 DIAGNOSIS — Z634 Disappearance and death of family member: Secondary | ICD-10-CM | POA: Diagnosis not present

## 2017-09-20 DIAGNOSIS — M25561 Pain in right knee: Secondary | ICD-10-CM | POA: Diagnosis not present

## 2017-10-04 ENCOUNTER — Ambulatory Visit: Payer: Medicare Other | Admitting: Orthopedic Surgery

## 2017-10-04 DIAGNOSIS — H524 Presbyopia: Secondary | ICD-10-CM | POA: Diagnosis not present

## 2017-10-04 DIAGNOSIS — H52223 Regular astigmatism, bilateral: Secondary | ICD-10-CM | POA: Diagnosis not present

## 2017-10-04 DIAGNOSIS — H5203 Hypermetropia, bilateral: Secondary | ICD-10-CM | POA: Diagnosis not present

## 2017-10-04 DIAGNOSIS — H35372 Puckering of macula, left eye: Secondary | ICD-10-CM | POA: Diagnosis not present

## 2017-10-06 ENCOUNTER — Ambulatory Visit (INDEPENDENT_AMBULATORY_CARE_PROVIDER_SITE_OTHER): Payer: Medicare Other | Admitting: Orthopedic Surgery

## 2017-10-06 ENCOUNTER — Encounter: Payer: Self-pay | Admitting: Orthopedic Surgery

## 2017-10-06 ENCOUNTER — Ambulatory Visit (INDEPENDENT_AMBULATORY_CARE_PROVIDER_SITE_OTHER): Payer: Medicare Other

## 2017-10-06 VITALS — BP 166/89 | HR 100 | Ht 59.0 in | Wt 130.0 lb

## 2017-10-06 DIAGNOSIS — M25561 Pain in right knee: Secondary | ICD-10-CM | POA: Diagnosis not present

## 2017-10-06 DIAGNOSIS — G8929 Other chronic pain: Secondary | ICD-10-CM | POA: Diagnosis not present

## 2017-10-06 DIAGNOSIS — M1711 Unilateral primary osteoarthritis, right knee: Secondary | ICD-10-CM

## 2017-10-06 NOTE — Progress Notes (Signed)
Progress Note   Patient ID: Carrie BosworthBetty W Dermody, female   DOB: [birth year], 83 y.o.   MRN: [id number]  Chief Complaint  Patient presents with  . Knee Pain    right knee pain and swelling also states knee gives out     HPI  83 year old female referred to us for evaluation of right greater than left knee pain which she has had for years however the right knee.  She has a history of lumbar surgery with fusion back in 2017  She has mild to moderate dull aching constant right knee pain consistent with osteoarthritis type symptoms associated with giving way and swelling   Review of Systems  Constitutional: Negative for chills and fever.  Skin: Negative.   Neurological: Negative for tremors and sensory change.   Current Meds  Medication Sig  . amLODipine (NORVASC) 5 MG tablet Take 5 mg by mouth daily.   . bumetanide (BUMEX) 1 MG tablet Take 1 mg by mouth daily after breakfast.  . losartan (COZAAR) 100 MG tablet Take 50 mg by mouth 2 (two) times daily.  Marland Kitchen. omeprazole (PRILOSEC) 20 MG capsule Take 20 mg by mouth daily.   . rosuvastatin (CRESTOR) 20 MG tablet Take 20 mg by mouth daily after breakfast.  . traMADol (ULTRAM) 50 MG tablet Take 50 mg every 6 (six) hours as needed by mouth.    Past Medical History:  Diagnosis Date  . Arthritis   . Constipation   . Gait disorder 05/26/2016  . H/O left breast biopsy   . High cholesterol   . Hypertension   . Paresthesia 05/26/2016  . Pessary maintenance   . Sinus drainage   . Sleeping difficulty      Allergies  Allergen Reactions  . Codeine Other (See Comments)    Pass out   . Sulfonamide Derivatives Rash    BP (!) 166/89   Pulse 100   Ht 4\' 11"  (1.499 m)   Wt 130 lb (59 kg)   BMI 26.26 kg/m    Physical Exam  Constitutional: She is oriented to person, place, and time. She appears well-developed and well-nourished. No distress.  Musculoskeletal:  Walker flexed posture at the lumbosacral junction  Neurological: She is alert and  oriented to person, place, and time.  Skin: She is not diaphoretic.  Psychiatric: She has a normal mood and affect.    Ortho Exam Right knee Inspection knee looks swollen, range of motion approximately 110 degrees stability test normal motor exam normal muscle tone skin no rash pulses intact neurologic exam no sensory loss   Symptomatic left knee medial joint line tenderness flexion arc 120 knee stable strength normal pulse intact good sensation normal skin   Medical decision-making  Imaging: X-ray shows degenerative arthritis please see my detailed report   Encounter Diagnosis  Name Primary?  . Chronic pain of right knee Yes      Meds ordered this encounter  Medications  . traMADol (ULTRAM) 50 MG tablet    Sig: Take 50 mg every 6 (six) hours as needed by mouth.   Impression and assessment.  This patient is not a surgical candidate and I think an injection plus or minus bracing would be beneficial for her.  Procedure note right knee injection verbal consent was obtained to inject right knee joint  Timeout was completed to confirm the site of injection  The medications used were 40 mg of Depo-Medrol and 1% lidocaine 3 cc  Anesthesia was provided by ethyl chloride and the skin  was prepped with alcohol.  After cleaning the skin with alcohol a 20-gauge needle was used to inject the right knee joint. There were no complications. A sterile bandage was applied.  Procedure note left knee injection verbal consent was obtained to inject left knee joint  Timeout was completed to confirm the site of injection  The medications used were 40 mg of Depo-Medrol and 1% lidocaine 3 cc  Anesthesia was provided by ethyl chloride and the skin was prepped with alcohol.  After cleaning the skin with alcohol a 20-gauge needle was used to inject the left knee joint. There were no complications. A sterile bandage was applied.     Fuller CanadaStanley Ronan Duecker, MD 10/06/2017 2:29 PM

## 2018-03-09 IMAGING — CR DG LUMBAR SPINE 2-3V
1 series · 1 of 1 positions shown · non-contrast
Comparison: 07/07/2016 .

CLINICAL DATA: Laminectomy.

EXAM:
LUMBAR SPINE - 2-3 VIEW

[lat]
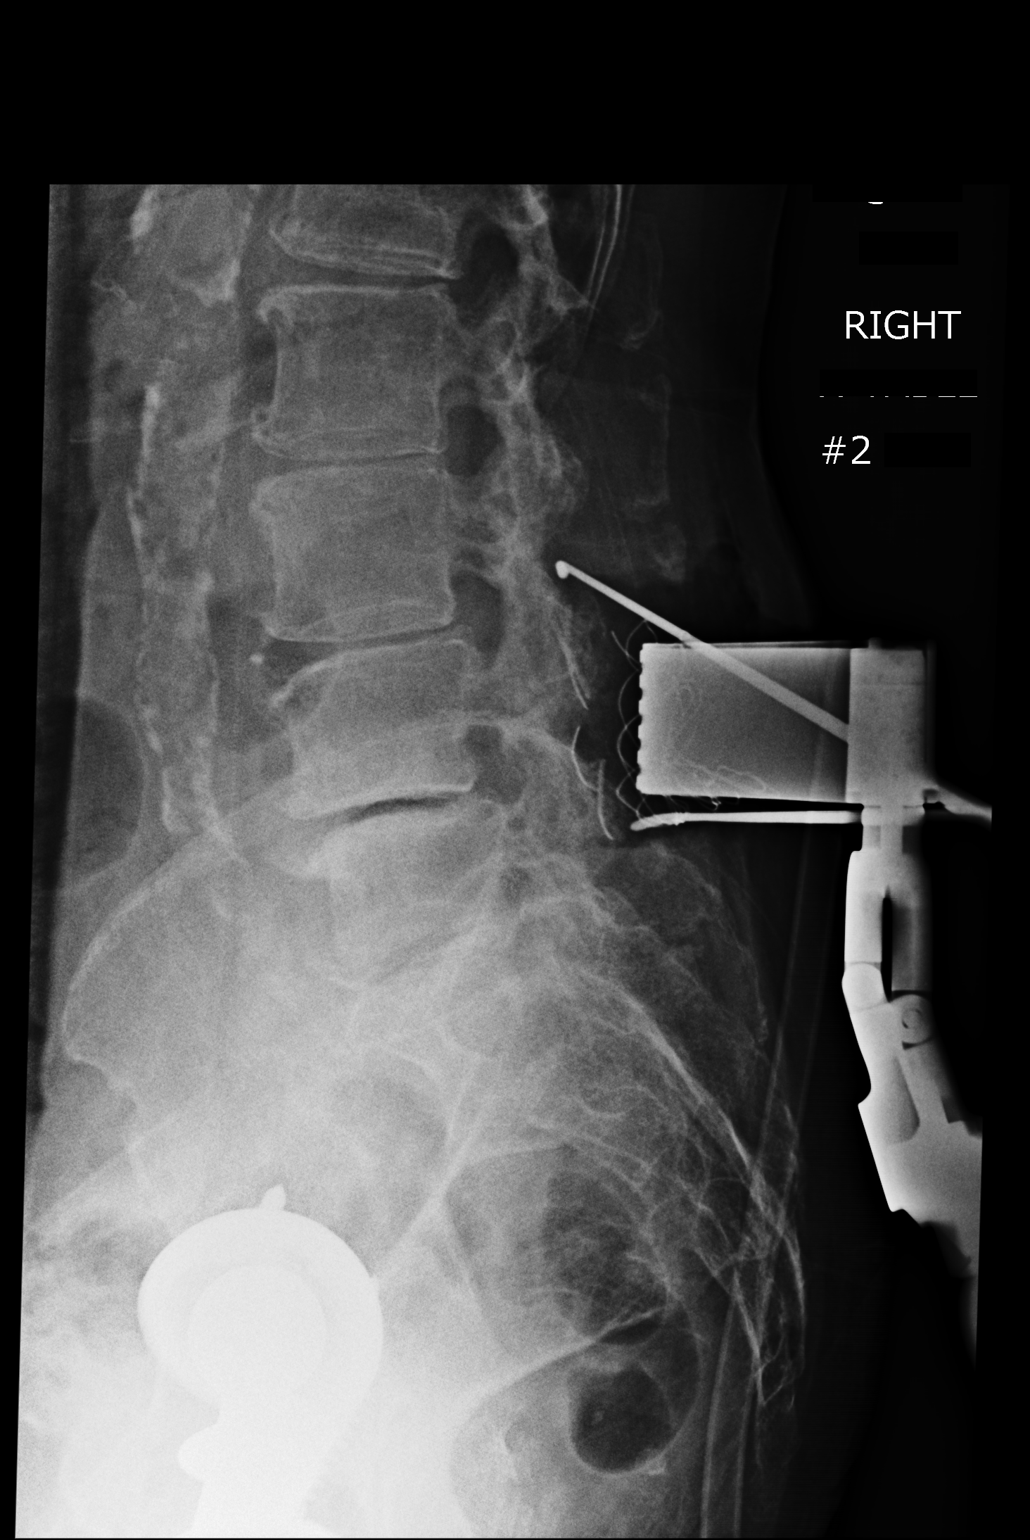

[1 of 1 positions shown; findings below may reference images not displayed]

FINDINGS: Lumbar spine numbered as per prior MRI . Metallic marker noted
posteriorly at L4 level on image number 1. Metallic markers noted
posteriorly at L3 and L5 on image number 2.
IMPRESSION: Postsurgical changes with metallic markers as above.

## 2018-03-23 DIAGNOSIS — I10 Essential (primary) hypertension: Secondary | ICD-10-CM | POA: Diagnosis not present

## 2018-03-25 DIAGNOSIS — I10 Essential (primary) hypertension: Secondary | ICD-10-CM | POA: Diagnosis not present

## 2018-03-25 DIAGNOSIS — M25561 Pain in right knee: Secondary | ICD-10-CM | POA: Diagnosis not present

## 2018-03-25 DIAGNOSIS — M25562 Pain in left knee: Secondary | ICD-10-CM | POA: Diagnosis not present

## 2018-03-25 DIAGNOSIS — M4800 Spinal stenosis, site unspecified: Secondary | ICD-10-CM | POA: Diagnosis not present

## 2018-03-25 DIAGNOSIS — G9009 Other idiopathic peripheral autonomic neuropathy: Secondary | ICD-10-CM | POA: Diagnosis not present

## 2018-03-25 DIAGNOSIS — N182 Chronic kidney disease, stage 2 (mild): Secondary | ICD-10-CM | POA: Diagnosis not present

## 2018-03-25 DIAGNOSIS — K219 Gastro-esophageal reflux disease without esophagitis: Secondary | ICD-10-CM | POA: Diagnosis not present

## 2018-03-25 DIAGNOSIS — M25559 Pain in unspecified hip: Secondary | ICD-10-CM | POA: Diagnosis not present

## 2018-03-25 DIAGNOSIS — D649 Anemia, unspecified: Secondary | ICD-10-CM | POA: Diagnosis not present

## 2018-03-25 DIAGNOSIS — E782 Mixed hyperlipidemia: Secondary | ICD-10-CM | POA: Diagnosis not present

## 2018-03-25 DIAGNOSIS — G5600 Carpal tunnel syndrome, unspecified upper limb: Secondary | ICD-10-CM | POA: Diagnosis not present

## 2018-03-25 DIAGNOSIS — Z6825 Body mass index (BMI) 25.0-25.9, adult: Secondary | ICD-10-CM | POA: Diagnosis not present

## 2018-04-11 DIAGNOSIS — X32XXXD Exposure to sunlight, subsequent encounter: Secondary | ICD-10-CM | POA: Diagnosis not present

## 2018-04-11 DIAGNOSIS — D0439 Carcinoma in situ of skin of other parts of face: Secondary | ICD-10-CM | POA: Diagnosis not present

## 2018-04-11 DIAGNOSIS — L570 Actinic keratosis: Secondary | ICD-10-CM | POA: Diagnosis not present

## 2018-04-11 DIAGNOSIS — D0462 Carcinoma in situ of skin of left upper limb, including shoulder: Secondary | ICD-10-CM | POA: Diagnosis not present

## 2018-06-06 DIAGNOSIS — L821 Other seborrheic keratosis: Secondary | ICD-10-CM | POA: Diagnosis not present

## 2018-06-06 DIAGNOSIS — L570 Actinic keratosis: Secondary | ICD-10-CM | POA: Diagnosis not present

## 2018-06-06 DIAGNOSIS — Z08 Encounter for follow-up examination after completed treatment for malignant neoplasm: Secondary | ICD-10-CM | POA: Diagnosis not present

## 2018-06-06 DIAGNOSIS — X32XXXD Exposure to sunlight, subsequent encounter: Secondary | ICD-10-CM | POA: Diagnosis not present

## 2018-06-06 DIAGNOSIS — Z85828 Personal history of other malignant neoplasm of skin: Secondary | ICD-10-CM | POA: Diagnosis not present

## 2018-09-12 DIAGNOSIS — E782 Mixed hyperlipidemia: Secondary | ICD-10-CM | POA: Diagnosis not present

## 2018-09-12 DIAGNOSIS — I10 Essential (primary) hypertension: Secondary | ICD-10-CM | POA: Diagnosis not present

## 2018-09-12 DIAGNOSIS — N182 Chronic kidney disease, stage 2 (mild): Secondary | ICD-10-CM | POA: Diagnosis not present

## 2018-09-14 DIAGNOSIS — Z0000 Encounter for general adult medical examination without abnormal findings: Secondary | ICD-10-CM | POA: Diagnosis not present

## 2018-09-14 DIAGNOSIS — E782 Mixed hyperlipidemia: Secondary | ICD-10-CM | POA: Diagnosis not present

## 2018-09-14 DIAGNOSIS — I129 Hypertensive chronic kidney disease with stage 1 through stage 4 chronic kidney disease, or unspecified chronic kidney disease: Secondary | ICD-10-CM | POA: Diagnosis not present

## 2018-09-14 DIAGNOSIS — K219 Gastro-esophageal reflux disease without esophagitis: Secondary | ICD-10-CM | POA: Diagnosis not present

## 2018-09-14 DIAGNOSIS — Z7409 Other reduced mobility: Secondary | ICD-10-CM | POA: Diagnosis not present

## 2018-09-14 DIAGNOSIS — N183 Chronic kidney disease, stage 3 (moderate): Secondary | ICD-10-CM | POA: Diagnosis not present

## 2018-09-14 DIAGNOSIS — Z6825 Body mass index (BMI) 25.0-25.9, adult: Secondary | ICD-10-CM | POA: Diagnosis not present

## 2018-09-14 DIAGNOSIS — M170 Bilateral primary osteoarthritis of knee: Secondary | ICD-10-CM | POA: Diagnosis not present

## 2018-12-04 DIAGNOSIS — B309 Viral conjunctivitis, unspecified: Secondary | ICD-10-CM | POA: Diagnosis not present

## 2018-12-04 DIAGNOSIS — J40 Bronchitis, not specified as acute or chronic: Secondary | ICD-10-CM | POA: Diagnosis not present

## 2018-12-04 DIAGNOSIS — J029 Acute pharyngitis, unspecified: Secondary | ICD-10-CM | POA: Diagnosis not present

## 2018-12-04 DIAGNOSIS — R067 Sneezing: Secondary | ICD-10-CM | POA: Diagnosis not present

## 2018-12-11 DIAGNOSIS — R918 Other nonspecific abnormal finding of lung field: Secondary | ICD-10-CM | POA: Diagnosis not present

## 2018-12-11 DIAGNOSIS — Z79899 Other long term (current) drug therapy: Secondary | ICD-10-CM | POA: Diagnosis not present

## 2018-12-11 DIAGNOSIS — R0602 Shortness of breath: Secondary | ICD-10-CM | POA: Diagnosis not present

## 2018-12-11 DIAGNOSIS — H6121 Impacted cerumen, right ear: Secondary | ICD-10-CM | POA: Diagnosis not present

## 2018-12-15 DIAGNOSIS — R9389 Abnormal findings on diagnostic imaging of other specified body structures: Secondary | ICD-10-CM | POA: Diagnosis not present

## 2018-12-15 DIAGNOSIS — I10 Essential (primary) hypertension: Secondary | ICD-10-CM | POA: Diagnosis not present

## 2019-01-02 DIAGNOSIS — J329 Chronic sinusitis, unspecified: Secondary | ICD-10-CM | POA: Diagnosis not present

## 2019-03-28 DIAGNOSIS — N183 Chronic kidney disease, stage 3 (moderate): Secondary | ICD-10-CM | POA: Diagnosis not present

## 2019-03-28 DIAGNOSIS — E782 Mixed hyperlipidemia: Secondary | ICD-10-CM | POA: Diagnosis not present

## 2019-03-28 DIAGNOSIS — K219 Gastro-esophageal reflux disease without esophagitis: Secondary | ICD-10-CM | POA: Diagnosis not present

## 2019-03-28 DIAGNOSIS — I10 Essential (primary) hypertension: Secondary | ICD-10-CM | POA: Diagnosis not present

## 2019-03-28 DIAGNOSIS — M170 Bilateral primary osteoarthritis of knee: Secondary | ICD-10-CM | POA: Diagnosis not present

## 2019-05-06 DIAGNOSIS — Z03818 Encounter for observation for suspected exposure to other biological agents ruled out: Secondary | ICD-10-CM | POA: Diagnosis not present

## 2019-06-29 ENCOUNTER — Other Ambulatory Visit: Payer: Self-pay

## 2019-08-15 DIAGNOSIS — M25551 Pain in right hip: Secondary | ICD-10-CM | POA: Diagnosis not present

## 2019-08-15 DIAGNOSIS — M170 Bilateral primary osteoarthritis of knee: Secondary | ICD-10-CM | POA: Diagnosis not present

## 2019-08-15 DIAGNOSIS — M25552 Pain in left hip: Secondary | ICD-10-CM | POA: Diagnosis not present

## 2019-08-16 DIAGNOSIS — I10 Essential (primary) hypertension: Secondary | ICD-10-CM | POA: Diagnosis not present

## 2019-08-16 DIAGNOSIS — E782 Mixed hyperlipidemia: Secondary | ICD-10-CM | POA: Diagnosis not present

## 2019-08-16 DIAGNOSIS — N182 Chronic kidney disease, stage 2 (mild): Secondary | ICD-10-CM | POA: Diagnosis not present

## 2019-08-16 DIAGNOSIS — H35372 Puckering of macula, left eye: Secondary | ICD-10-CM | POA: Diagnosis not present

## 2019-08-16 DIAGNOSIS — D649 Anemia, unspecified: Secondary | ICD-10-CM | POA: Diagnosis not present

## 2019-08-19 DIAGNOSIS — N183 Chronic kidney disease, stage 3 (moderate): Secondary | ICD-10-CM | POA: Diagnosis not present

## 2019-08-19 DIAGNOSIS — I10 Essential (primary) hypertension: Secondary | ICD-10-CM | POA: Diagnosis not present

## 2019-08-19 DIAGNOSIS — E782 Mixed hyperlipidemia: Secondary | ICD-10-CM | POA: Diagnosis not present

## 2019-08-19 DIAGNOSIS — M170 Bilateral primary osteoarthritis of knee: Secondary | ICD-10-CM | POA: Diagnosis not present

## 2019-08-19 DIAGNOSIS — I129 Hypertensive chronic kidney disease with stage 1 through stage 4 chronic kidney disease, or unspecified chronic kidney disease: Secondary | ICD-10-CM | POA: Diagnosis not present

## 2019-08-19 DIAGNOSIS — K219 Gastro-esophageal reflux disease without esophagitis: Secondary | ICD-10-CM | POA: Diagnosis not present

## 2019-12-25 DIAGNOSIS — Z23 Encounter for immunization: Secondary | ICD-10-CM | POA: Diagnosis not present

## 2020-01-22 DIAGNOSIS — Z23 Encounter for immunization: Secondary | ICD-10-CM | POA: Diagnosis not present

## 2020-02-16 DIAGNOSIS — M4800 Spinal stenosis, site unspecified: Secondary | ICD-10-CM | POA: Diagnosis not present

## 2020-02-16 DIAGNOSIS — M25562 Pain in left knee: Secondary | ICD-10-CM | POA: Diagnosis not present

## 2020-02-16 DIAGNOSIS — I129 Hypertensive chronic kidney disease with stage 1 through stage 4 chronic kidney disease, or unspecified chronic kidney disease: Secondary | ICD-10-CM | POA: Diagnosis not present

## 2020-02-16 DIAGNOSIS — D649 Anemia, unspecified: Secondary | ICD-10-CM | POA: Diagnosis not present

## 2020-02-16 DIAGNOSIS — G9009 Other idiopathic peripheral autonomic neuropathy: Secondary | ICD-10-CM | POA: Diagnosis not present

## 2020-02-16 DIAGNOSIS — K219 Gastro-esophageal reflux disease without esophagitis: Secondary | ICD-10-CM | POA: Diagnosis not present

## 2020-02-16 DIAGNOSIS — E782 Mixed hyperlipidemia: Secondary | ICD-10-CM | POA: Diagnosis not present

## 2020-02-16 DIAGNOSIS — M25561 Pain in right knee: Secondary | ICD-10-CM | POA: Diagnosis not present

## 2020-02-16 DIAGNOSIS — N182 Chronic kidney disease, stage 2 (mild): Secondary | ICD-10-CM | POA: Diagnosis not present

## 2020-02-16 DIAGNOSIS — N183 Chronic kidney disease, stage 3 unspecified: Secondary | ICD-10-CM | POA: Diagnosis not present

## 2020-02-16 DIAGNOSIS — M25559 Pain in unspecified hip: Secondary | ICD-10-CM | POA: Diagnosis not present

## 2020-02-16 DIAGNOSIS — I10 Essential (primary) hypertension: Secondary | ICD-10-CM | POA: Diagnosis not present

## 2020-02-21 DIAGNOSIS — I129 Hypertensive chronic kidney disease with stage 1 through stage 4 chronic kidney disease, or unspecified chronic kidney disease: Secondary | ICD-10-CM | POA: Diagnosis not present

## 2020-02-21 DIAGNOSIS — G609 Hereditary and idiopathic neuropathy, unspecified: Secondary | ICD-10-CM | POA: Diagnosis not present

## 2020-02-21 DIAGNOSIS — I10 Essential (primary) hypertension: Secondary | ICD-10-CM | POA: Diagnosis not present

## 2020-02-21 DIAGNOSIS — Z0001 Encounter for general adult medical examination with abnormal findings: Secondary | ICD-10-CM | POA: Diagnosis not present

## 2020-02-21 DIAGNOSIS — K219 Gastro-esophageal reflux disease without esophagitis: Secondary | ICD-10-CM | POA: Diagnosis not present

## 2020-02-21 DIAGNOSIS — E782 Mixed hyperlipidemia: Secondary | ICD-10-CM | POA: Diagnosis not present

## 2020-02-21 DIAGNOSIS — N1831 Chronic kidney disease, stage 3a: Secondary | ICD-10-CM | POA: Diagnosis not present

## 2020-02-21 DIAGNOSIS — M170 Bilateral primary osteoarthritis of knee: Secondary | ICD-10-CM | POA: Diagnosis not present

## 2020-08-23 DIAGNOSIS — I10 Essential (primary) hypertension: Secondary | ICD-10-CM | POA: Diagnosis not present

## 2020-08-23 DIAGNOSIS — M25561 Pain in right knee: Secondary | ICD-10-CM | POA: Diagnosis not present

## 2020-08-23 DIAGNOSIS — I129 Hypertensive chronic kidney disease with stage 1 through stage 4 chronic kidney disease, or unspecified chronic kidney disease: Secondary | ICD-10-CM | POA: Diagnosis not present

## 2020-08-23 DIAGNOSIS — D649 Anemia, unspecified: Secondary | ICD-10-CM | POA: Diagnosis not present

## 2020-08-23 DIAGNOSIS — G9009 Other idiopathic peripheral autonomic neuropathy: Secondary | ICD-10-CM | POA: Diagnosis not present

## 2020-08-23 DIAGNOSIS — E782 Mixed hyperlipidemia: Secondary | ICD-10-CM | POA: Diagnosis not present

## 2020-08-23 DIAGNOSIS — M25559 Pain in unspecified hip: Secondary | ICD-10-CM | POA: Diagnosis not present

## 2020-08-23 DIAGNOSIS — M4800 Spinal stenosis, site unspecified: Secondary | ICD-10-CM | POA: Diagnosis not present

## 2020-08-23 DIAGNOSIS — N182 Chronic kidney disease, stage 2 (mild): Secondary | ICD-10-CM | POA: Diagnosis not present

## 2020-08-23 DIAGNOSIS — M25562 Pain in left knee: Secondary | ICD-10-CM | POA: Diagnosis not present

## 2020-08-23 DIAGNOSIS — K219 Gastro-esophageal reflux disease without esophagitis: Secondary | ICD-10-CM | POA: Diagnosis not present

## 2020-08-23 DIAGNOSIS — G5600 Carpal tunnel syndrome, unspecified upper limb: Secondary | ICD-10-CM | POA: Diagnosis not present

## 2020-08-28 DIAGNOSIS — G609 Hereditary and idiopathic neuropathy, unspecified: Secondary | ICD-10-CM | POA: Diagnosis not present

## 2020-08-28 DIAGNOSIS — K219 Gastro-esophageal reflux disease without esophagitis: Secondary | ICD-10-CM | POA: Diagnosis not present

## 2020-08-28 DIAGNOSIS — E782 Mixed hyperlipidemia: Secondary | ICD-10-CM | POA: Diagnosis not present

## 2020-08-28 DIAGNOSIS — N1831 Chronic kidney disease, stage 3a: Secondary | ICD-10-CM | POA: Diagnosis not present

## 2020-08-28 DIAGNOSIS — I10 Essential (primary) hypertension: Secondary | ICD-10-CM | POA: Diagnosis not present

## 2020-08-28 DIAGNOSIS — Z2821 Immunization not carried out because of patient refusal: Secondary | ICD-10-CM | POA: Diagnosis not present

## 2020-08-28 DIAGNOSIS — M170 Bilateral primary osteoarthritis of knee: Secondary | ICD-10-CM | POA: Diagnosis not present

## 2020-08-28 DIAGNOSIS — I129 Hypertensive chronic kidney disease with stage 1 through stage 4 chronic kidney disease, or unspecified chronic kidney disease: Secondary | ICD-10-CM | POA: Diagnosis not present

## 2021-02-25 DIAGNOSIS — I129 Hypertensive chronic kidney disease with stage 1 through stage 4 chronic kidney disease, or unspecified chronic kidney disease: Secondary | ICD-10-CM | POA: Diagnosis not present

## 2021-02-25 DIAGNOSIS — G609 Hereditary and idiopathic neuropathy, unspecified: Secondary | ICD-10-CM | POA: Diagnosis not present

## 2021-02-25 DIAGNOSIS — N182 Chronic kidney disease, stage 2 (mild): Secondary | ICD-10-CM | POA: Diagnosis not present

## 2021-02-25 DIAGNOSIS — Z6825 Body mass index (BMI) 25.0-25.9, adult: Secondary | ICD-10-CM | POA: Diagnosis not present

## 2021-02-25 DIAGNOSIS — D649 Anemia, unspecified: Secondary | ICD-10-CM | POA: Diagnosis not present

## 2021-02-25 DIAGNOSIS — N1831 Chronic kidney disease, stage 3a: Secondary | ICD-10-CM | POA: Diagnosis not present

## 2021-02-25 DIAGNOSIS — I10 Essential (primary) hypertension: Secondary | ICD-10-CM | POA: Diagnosis not present

## 2021-02-25 DIAGNOSIS — R7301 Impaired fasting glucose: Secondary | ICD-10-CM | POA: Diagnosis not present

## 2021-02-25 DIAGNOSIS — E782 Mixed hyperlipidemia: Secondary | ICD-10-CM | POA: Diagnosis not present

## 2021-03-03 DIAGNOSIS — I10 Essential (primary) hypertension: Secondary | ICD-10-CM | POA: Diagnosis not present

## 2021-03-03 DIAGNOSIS — R945 Abnormal results of liver function studies: Secondary | ICD-10-CM | POA: Diagnosis not present

## 2021-03-03 DIAGNOSIS — M170 Bilateral primary osteoarthritis of knee: Secondary | ICD-10-CM | POA: Diagnosis not present

## 2021-03-03 DIAGNOSIS — G609 Hereditary and idiopathic neuropathy, unspecified: Secondary | ICD-10-CM | POA: Diagnosis not present

## 2021-03-03 DIAGNOSIS — N1831 Chronic kidney disease, stage 3a: Secondary | ICD-10-CM | POA: Diagnosis not present

## 2021-03-03 DIAGNOSIS — D696 Thrombocytopenia, unspecified: Secondary | ICD-10-CM | POA: Diagnosis not present

## 2021-03-03 DIAGNOSIS — I129 Hypertensive chronic kidney disease with stage 1 through stage 4 chronic kidney disease, or unspecified chronic kidney disease: Secondary | ICD-10-CM | POA: Diagnosis not present

## 2021-03-03 DIAGNOSIS — Z712 Person consulting for explanation of examination or test findings: Secondary | ICD-10-CM | POA: Diagnosis not present

## 2021-03-03 DIAGNOSIS — E782 Mixed hyperlipidemia: Secondary | ICD-10-CM | POA: Diagnosis not present

## 2021-03-03 DIAGNOSIS — K219 Gastro-esophageal reflux disease without esophagitis: Secondary | ICD-10-CM | POA: Diagnosis not present

## 2021-05-04 ENCOUNTER — Other Ambulatory Visit: Payer: Self-pay

## 2021-05-04 ENCOUNTER — Emergency Department (HOSPITAL_COMMUNITY): Payer: Medicare Other

## 2021-05-04 ENCOUNTER — Emergency Department (HOSPITAL_COMMUNITY)
Admission: EM | Admit: 2021-05-04 | Discharge: 2021-05-04 | Disposition: A | Discharge status: Home / Self Care | Payer: Medicare Other | Attending: Emergency Medicine | Admitting: Emergency Medicine

## 2021-05-04 DIAGNOSIS — I6381 Other cerebral infarction due to occlusion or stenosis of small artery: Secondary | ICD-10-CM | POA: Diagnosis not present

## 2021-05-04 DIAGNOSIS — W01198A Fall on same level from slipping, tripping and stumbling with subsequent striking against other object, initial encounter: Secondary | ICD-10-CM | POA: Diagnosis not present

## 2021-05-04 DIAGNOSIS — Z20822 Contact with and (suspected) exposure to covid-19: Secondary | ICD-10-CM | POA: Insufficient documentation

## 2021-05-04 DIAGNOSIS — Y92019 Unspecified place in single-family (private) house as the place of occurrence of the external cause: Secondary | ICD-10-CM | POA: Insufficient documentation

## 2021-05-04 DIAGNOSIS — S0990XA Unspecified injury of head, initial encounter: Secondary | ICD-10-CM

## 2021-05-04 DIAGNOSIS — R519 Headache, unspecified: Secondary | ICD-10-CM | POA: Diagnosis not present

## 2021-05-04 DIAGNOSIS — T148XXA Other injury of unspecified body region, initial encounter: Secondary | ICD-10-CM

## 2021-05-04 DIAGNOSIS — R58 Hemorrhage, not elsewhere classified: Secondary | ICD-10-CM | POA: Diagnosis not present

## 2021-05-04 DIAGNOSIS — Z23 Encounter for immunization: Secondary | ICD-10-CM | POA: Diagnosis not present

## 2021-05-04 DIAGNOSIS — Z96641 Presence of right artificial hip joint: Secondary | ICD-10-CM | POA: Insufficient documentation

## 2021-05-04 DIAGNOSIS — R000 Tachycardia, unspecified: Secondary | ICD-10-CM | POA: Diagnosis not present

## 2021-05-04 DIAGNOSIS — M79603 Pain in arm, unspecified: Secondary | ICD-10-CM | POA: Diagnosis not present

## 2021-05-04 DIAGNOSIS — R0902 Hypoxemia: Secondary | ICD-10-CM | POA: Diagnosis not present

## 2021-05-04 DIAGNOSIS — M5030 Other cervical disc degeneration, unspecified cervical region: Secondary | ICD-10-CM | POA: Diagnosis not present

## 2021-05-04 DIAGNOSIS — M4312 Spondylolisthesis, cervical region: Secondary | ICD-10-CM | POA: Diagnosis not present

## 2021-05-04 DIAGNOSIS — S0181XA Laceration without foreign body of other part of head, initial encounter: Secondary | ICD-10-CM | POA: Diagnosis not present

## 2021-05-04 DIAGNOSIS — Z87891 Personal history of nicotine dependence: Secondary | ICD-10-CM | POA: Insufficient documentation

## 2021-05-04 DIAGNOSIS — Z043 Encounter for examination and observation following other accident: Secondary | ICD-10-CM | POA: Diagnosis not present

## 2021-05-04 DIAGNOSIS — R0689 Other abnormalities of breathing: Secondary | ICD-10-CM | POA: Diagnosis not present

## 2021-05-04 DIAGNOSIS — S41112A Laceration without foreign body of left upper arm, initial encounter: Secondary | ICD-10-CM | POA: Diagnosis not present

## 2021-05-04 DIAGNOSIS — I10 Essential (primary) hypertension: Secondary | ICD-10-CM | POA: Insufficient documentation

## 2021-05-04 DIAGNOSIS — Z79899 Other long term (current) drug therapy: Secondary | ICD-10-CM | POA: Insufficient documentation

## 2021-05-04 DIAGNOSIS — W19XXXA Unspecified fall, initial encounter: Secondary | ICD-10-CM

## 2021-05-04 LAB — CBC WITH DIFFERENTIAL/PLATELET
Abs Immature Granulocytes: 0.03 10*3/uL (ref 0.00–0.07)
Basophils Absolute: 0 10*3/uL (ref 0.0–0.1)
Basophils Relative: 1 %
Eosinophils Absolute: 0.2 10*3/uL (ref 0.0–0.5)
Eosinophils Relative: 2 %
HCT: 39.7 % (ref 36.0–46.0)
Hemoglobin: 12.9 g/dL (ref 12.0–15.0)
Immature Granulocytes: 0 %
Lymphocytes Relative: 10 %
Lymphs Abs: 0.8 10*3/uL (ref 0.7–4.0)
MCH: 31.2 pg (ref 26.0–34.0)
MCHC: 32.5 g/dL (ref 30.0–36.0)
MCV: 96.1 fL (ref 80.0–100.0)
Monocytes Absolute: 0.6 10*3/uL (ref 0.1–1.0)
Monocytes Relative: 7 %
Neutro Abs: 6.5 10*3/uL (ref 1.7–7.7)
Neutrophils Relative %: 80 %
Platelets: 147 10*3/uL — ABNORMAL LOW (ref 150–400)
RBC: 4.13 MIL/uL (ref 3.87–5.11)
RDW: 13.4 % (ref 11.5–15.5)
WBC: 8.2 10*3/uL (ref 4.0–10.5)
nRBC: 0 % (ref 0.0–0.2)

## 2021-05-04 LAB — BASIC METABOLIC PANEL
Anion gap: 8 (ref 5–15)
BUN: 19 mg/dL (ref 8–23)
CO2: 26 mmol/L (ref 22–32)
Calcium: 9.3 mg/dL (ref 8.9–10.3)
Chloride: 107 mmol/L (ref 98–111)
Creatinine, Ser: 1.26 mg/dL — ABNORMAL HIGH (ref 0.44–1.00)
GFR, Estimated: 40 mL/min — ABNORMAL LOW (ref >60)
Glucose, Bld: 113 mg/dL — ABNORMAL HIGH (ref 70–99)
Potassium: 3.4 mmol/L — ABNORMAL LOW (ref 3.5–5.1)
Sodium: 141 mmol/L (ref 135–145)

## 2021-05-04 LAB — RESP PANEL BY RT-PCR (FLU A&B, COVID) ARPGX2
Influenza A by PCR: NEGATIVE
Influenza B by PCR: NEGATIVE
SARS Coronavirus 2 by RT PCR: NEGATIVE

## 2021-05-04 MED ORDER — TETANUS-DIPHTH-ACELL PERTUSSIS 5-2.5-18.5 LF-MCG/0.5 IM SUSY
0.5000 mL | PREFILLED_SYRINGE | Freq: Once | INTRAMUSCULAR | Status: AC
  Administered 2021-05-04: 0.5 mL via INTRAMUSCULAR
  Filled 2021-05-04: qty 0.5

## 2021-05-04 MED ORDER — LIDOCAINE HCL (PF) 1 % IJ SOLN
5.0000 mL | Freq: Once | INTRAMUSCULAR | Status: AC
  Administered 2021-05-04: 5 mL
  Filled 2021-05-04: qty 30

## 2021-05-04 NOTE — ED Provider Notes (Signed)
[facility] EMERGENCY DEPARTMENT Provider Note   CSN: [id number] Arrival date & time: 05/04/21  1204     History Chief Complaint  Patient presents with  . Fall    Carrie Sanders is a 87 y.o. female.  Patient states that she lost her balance and fell while at home today and hit her head complaining of headache and cut to her left upper extremity.  She otherwise denies any pain at this time.  Denies any chest pain or abdominal pain or other extremity pain.  Denies loss of consciousness.  She lives alone at home but her daughter lives nearby and checks on her daily.        Past Medical History:  Diagnosis Date  . Arthritis   . Constipation   . Gait disorder 05/26/2016  . H/O left breast biopsy   . High cholesterol   . Hypertension   . Paresthesia 05/26/2016  . Pessary maintenance   . Sinus drainage   . Sleeping difficulty     Patient Active Problem List   Diagnosis Date Noted  . Spinal stenosis at L4-L5 level 07/24/2016  . Paresthesia 05/26/2016  . Gait disorder 05/26/2016  . S/P Left THA, AA 03/17/2012  . KNEE, ARTHRITIS, DEGEN./OSTEO 05/23/2009  . KNEE PAIN 05/23/2009  . ANSERINE BURSITIS, LEFT 05/23/2009  . TRIGGER FINGER 05/23/2009  . HAND PAIN 05/23/2009  . HIGH BLOOD PRESSURE 05/23/2009    Past Surgical History:  Procedure Laterality Date  . APPENDECTOMY     AGE 80  . CATARACTS REMOVED    . JOINT REPLACEMENT  1998   RT TOTAL HIP  . TOTAL HIP ARTHROPLASTY  03/17/2012   Procedure: TOTAL HIP ARTHROPLASTY ANTERIOR APPROACH;  Surgeon: Shelda Pal, MD;  Location: [facility];  Service: Orthopedics;  Laterality: Left;     OB History   No obstetric history on file.     Family History  Problem Relation Age of Onset  . Heart disease Brother   . Stroke Mother   . Cerebral aneurysm Sister     Social History   Tobacco Use  . Smoking status: Former Smoker    Quit date: 03/10/1959    Years since quitting: 62.1  . Smokeless tobacco: Never Used  Substance  Use Topics  . Alcohol use: No  . Drug use: No    Home Medications Prior to Admission medications   Medication Sig Start Date End Date Taking? Authorizing Provider  amLODipine (NORVASC) 5 MG tablet Take 5 mg by mouth daily.  05/21/16   [provider]  bumetanide (BUMEX) 1 MG tablet Take 1 mg by mouth daily after breakfast.    [provider]  gabapentin (NEURONTIN) 100 MG capsule Take 1 capsule (100 mg total) by mouth 3 (three) times daily. Patient not taking: Reported on 10/06/2017 05/26/16   York Spaniel, MD  ibuprofen (ADVIL,MOTRIN) 200 MG tablet Take 200 mg by mouth every 6 (six) hours as needed for mild pain.     [provider]  losartan (COZAAR) 100 MG tablet Take 50 mg by mouth 2 (two) times daily.    [provider]  omeprazole (PRILOSEC) 20 MG capsule Take 20 mg by mouth daily.  05/22/16   [provider]  rosuvastatin (CRESTOR) 20 MG tablet Take 20 mg by mouth daily after breakfast.    [provider]  traMADol (ULTRAM) 50 MG tablet Take 50 mg every 6 (six) hours as needed by mouth.    [provider]  Allergies    Codeine and Sulfonamide derivatives  Review of Systems   Review of Systems  Constitutional: Negative for fever.  HENT: Negative for ear pain.   Eyes: Negative for pain.  Respiratory: Negative for cough.   Cardiovascular: Negative for chest pain.  Gastrointestinal: Negative for abdominal pain.  Genitourinary: Negative for flank pain.  Musculoskeletal: Negative for back pain.  Skin: Positive for wound. Negative for rash.  Neurological: Positive for headaches.    Physical Exam Updated Vital Signs BP 120/76   Pulse (!) 107   Temp 98.1 F (36.7 C) (Oral)   Resp 18   Ht 5\' 1"  (1.549 m)   Wt 54.4 kg   SpO2 (!) 84%   BMI 22.67 kg/m   Physical Exam Constitutional:      General: She is not in acute distress.    Appearance: Normal appearance.  HENT:     Head: Normocephalic.     Nose:  Nose normal.  Eyes:     Extraocular Movements: Extraocular movements intact.  Cardiovascular:     Rate and Rhythm: Normal rate.  Pulmonary:     Effort: Pulmonary effort is normal.  Musculoskeletal:        General: Normal range of motion.     Cervical back: Normal range of motion.  Skin:    Comments: 1 cm laceration to mid forehead.  Left upper extremity 3 x 4 cm skin tear noted.  Neurological:     General: No focal deficit present.     Mental Status: She is alert. Mental status is at baseline.     ED Results / Procedures / Treatments   Labs (all labs ordered are listed, but only abnormal results are displayed) Labs Reviewed  RESP PANEL BY RT-PCR (FLU A&B, COVID) [id number]  CBC WITH DIFFERENTIAL/PLATELET  BASIC METABOLIC PANEL    EKG EKG Interpretation  Date/Time:  Sunday May 04 2021 12:20:49 EDT Ventricular Rate:  118 PR Interval:  141 QRS Duration: 126 QT Interval:  350 QTC Calculation: 491 R Axis:   267 Text Interpretation: Sinus tachycardia Right bundle branch block Inferior infarct, old Lateral leads are also involved Confirmed by Norman ClayHong, Lynnmarie Lovett ([id number]) on 05/04/2021 12:53:17 PM   Radiology CT Head Wo Contrast  Result Date: 05/04/2021 CLINICAL DATA:  Fall with head injury EXAM: CT HEAD WITHOUT CONTRAST CT CERVICAL SPINE WITHOUT CONTRAST TECHNIQUE: Multidetector CT imaging of the head and cervical spine was performed following the standard protocol without intravenous contrast. Multiplanar CT image reconstructions of the cervical spine were also generated. COMPARISON:  MR cervical spine December 23, 2016 FINDINGS: CT HEAD FINDINGS Brain: No evidence of acute large vascular territory infarction, hemorrhage, hydrocephalus, extra-axial collection or mass lesion/mass effect. Mild for age global parenchymal volume loss. Chronic lacunar type infarct in the right basal ganglia. Mineralization of bilateral basal ganglia. White matter hypodensity involving the paramedian right parietal  lobe on image 21/2, sequela of prior insult. Vascular: No hyperdense vessel. Atherosclerotic calcifications of the internal carotid and vertebral arteries at the skull base. Skull: Soft tissue swelling centered over the left paramedian frontal bone. No fracture or focal lesion identified. Sinuses/Orbits: The visualized paranasal sinuses and mastoid air cells are predominantly clear. Orbits are grossly unremarkable. Other: Cerumen in the left external auditory canal. Dental hardware. CT CERVICAL SPINE FINDINGS Alignment: Straightening of the normal cervical lordosis. Stable anterolisthesis of C3 on C4, C4 on C5 and C7 on T1. No evidence of traumatic listhesis. Skull base and vertebrae: No acute displaced fracture. Soft tissues  and spinal canal: No prevertebral fluid or swelling. No visible canal hematoma. Disc levels: Advanced diffuse degenerative disc disease and facet/uncovertebral hypertrophy, most significant in the lower cervical spine and similar to MRI June 22, 2017. Upper chest: Biapical pleuroparenchymal scarring. Other: Carotid artery calcifications. Hypodense 1.3 cm left thyroid nodule. Not clinically significant; no follow-up imaging recommended (ref: J Am Coll Radiol. 2015 Feb;12(2): 143-50). IMPRESSION: 1. No evidence of acute intracranial abnormality. 2. Soft tissue swelling centered over the left paramedian frontal bone. No evidence of underlying calvarial fracture. 3. No evidence of acute fracture or traumatic listhesis of the cervical spine. 4. Similar advanced diffuse degenerative disc disease and facet/uncovertebral hypertrophy of the cervical spine. Electronically Signed   By: Maudry Mayhew MD   On: 05/04/2021 13:21   CT Cervical Spine Wo Contrast  Result Date: 05/04/2021 CLINICAL DATA:  Fall with head injury EXAM: CT HEAD WITHOUT CONTRAST CT CERVICAL SPINE WITHOUT CONTRAST TECHNIQUE: Multidetector CT imaging of the head and cervical spine was performed following the standard protocol without  intravenous contrast. Multiplanar CT image reconstructions of the cervical spine were also generated. COMPARISON:  MR cervical spine December 23, 2016 FINDINGS: CT HEAD FINDINGS Brain: No evidence of acute large vascular territory infarction, hemorrhage, hydrocephalus, extra-axial collection or mass lesion/mass effect. Mild for age global parenchymal volume loss. Chronic lacunar type infarct in the right basal ganglia. Mineralization of bilateral basal ganglia. White matter hypodensity involving the paramedian right parietal lobe on image 21/2, sequela of prior insult. Vascular: No hyperdense vessel. Atherosclerotic calcifications of the internal carotid and vertebral arteries at the skull base. Skull: Soft tissue swelling centered over the left paramedian frontal bone. No fracture or focal lesion identified. Sinuses/Orbits: The visualized paranasal sinuses and mastoid air cells are predominantly clear. Orbits are grossly unremarkable. Other: Cerumen in the left external auditory canal. Dental hardware. CT CERVICAL SPINE FINDINGS Alignment: Straightening of the normal cervical lordosis. Stable anterolisthesis of C3 on C4, C4 on C5 and C7 on T1. No evidence of traumatic listhesis. Skull base and vertebrae: No acute displaced fracture. Soft tissues and spinal canal: No prevertebral fluid or swelling. No visible canal hematoma. Disc levels: Advanced diffuse degenerative disc disease and facet/uncovertebral hypertrophy, most significant in the lower cervical spine and similar to MRI June 22, 2017. Upper chest: Biapical pleuroparenchymal scarring. Other: Carotid artery calcifications. Hypodense 1.3 cm left thyroid nodule. Not clinically significant; no follow-up imaging recommended (ref: J Am Coll Radiol. 2015 Feb;12(2): 143-50). IMPRESSION: 1. No evidence of acute intracranial abnormality. 2. Soft tissue swelling centered over the left paramedian frontal bone. No evidence of underlying calvarial fracture. 3. No evidence  of acute fracture or traumatic listhesis of the cervical spine. 4. Similar advanced diffuse degenerative disc disease and facet/uncovertebral hypertrophy of the cervical spine. Electronically Signed   By: Maudry Mayhew MD   On: 05/04/2021 13:21    Procedures .Marland KitchenLaceration Repair  Date/Time: 05/04/2021 2:49 PM Performed by: Cheryll Cockayne, MD Authorized by: Cheryll Cockayne, MD   Comments:     2 cm forehead laceration repaired with Dermabond glue good approximation of edges patient tolerated procedure well.  Left upper extremity skin tear addressed with washing of the wound and Xeroform dressing and nonocclusive dressing on top.  Advised daily dressing changes immediate return for signs of infection such as purulence redness increased pain or any additional concerns.     Medications Ordered in ED Medications  Tdap (BOOSTRIX) injection 0.5 mL (0.5 mLs Intramuscular Given 05/04/21 1332)  lidocaine (PF) (  XYLOCAINE) 1 % injection 5 mL (5 mLs Infiltration Given 05/04/21 1350)    ED Course  I have reviewed the triage vital signs and the nursing notes.  Pertinent labs & imaging results that were available during my care of the patient were reviewed by me and considered in my medical decision making (see chart for details).    MDM Rules/Calculators/A&P                          Initially as the patient states this was mechanical loss of balance and fall imaging was performed.  No acute findings noted on CT of the head and C-spine.  Forehead laceration repaired by myself with glue.  Left upper extremity skin tear not amenable to suture repair, Xeroform dressing placed after wound thoroughly irrigated and washed with sterile saline.  Family later arrived, states that they cannot take care of her at home anymore they are concerned that she is not safe at home living alone, requesting placement.  Social work consulted.  Final Clinical Impression(s) / ED Diagnoses Final diagnoses:  Fall, initial  encounter  Injury of head, initial encounter  Abrasion    Rx / DC Orders ED Discharge Orders    None       Cheryll Cockayne, MD 05/04/21 1450

## 2021-05-04 NOTE — ED Triage Notes (Signed)
Pt was at home and stumbled when she hit a cabinet door with her leg she fell hitting her head on the counter causing a knot and small laceration to her forehead. Pt denies any loss of consciousness and was able to call her daughter on the phone. She does not take any blood thinners.

## 2021-05-04 NOTE — Discharge Instructions (Addendum)
Follow up with your md this week. °

## 2021-09-05 DIAGNOSIS — E782 Mixed hyperlipidemia: Secondary | ICD-10-CM | POA: Diagnosis not present

## 2021-09-05 DIAGNOSIS — Z0000 Encounter for general adult medical examination without abnormal findings: Secondary | ICD-10-CM | POA: Diagnosis not present

## 2021-09-05 DIAGNOSIS — I10 Essential (primary) hypertension: Secondary | ICD-10-CM | POA: Diagnosis not present

## 2021-09-05 DIAGNOSIS — R7301 Impaired fasting glucose: Secondary | ICD-10-CM | POA: Diagnosis not present

## 2021-09-08 DIAGNOSIS — N1831 Chronic kidney disease, stage 3a: Secondary | ICD-10-CM | POA: Diagnosis not present

## 2021-09-08 DIAGNOSIS — K219 Gastro-esophageal reflux disease without esophagitis: Secondary | ICD-10-CM | POA: Diagnosis not present

## 2021-09-08 DIAGNOSIS — M170 Bilateral primary osteoarthritis of knee: Secondary | ICD-10-CM | POA: Diagnosis not present

## 2021-09-08 DIAGNOSIS — E782 Mixed hyperlipidemia: Secondary | ICD-10-CM | POA: Diagnosis not present

## 2021-09-08 DIAGNOSIS — Z0001 Encounter for general adult medical examination with abnormal findings: Secondary | ICD-10-CM | POA: Diagnosis not present

## 2021-09-08 DIAGNOSIS — D696 Thrombocytopenia, unspecified: Secondary | ICD-10-CM | POA: Diagnosis not present

## 2021-09-08 DIAGNOSIS — R945 Abnormal results of liver function studies: Secondary | ICD-10-CM | POA: Diagnosis not present

## 2021-09-08 DIAGNOSIS — I129 Hypertensive chronic kidney disease with stage 1 through stage 4 chronic kidney disease, or unspecified chronic kidney disease: Secondary | ICD-10-CM | POA: Diagnosis not present

## 2021-09-08 DIAGNOSIS — G609 Hereditary and idiopathic neuropathy, unspecified: Secondary | ICD-10-CM | POA: Diagnosis not present

## 2021-09-08 DIAGNOSIS — R7301 Impaired fasting glucose: Secondary | ICD-10-CM | POA: Diagnosis not present

## 2022-03-05 DIAGNOSIS — E782 Mixed hyperlipidemia: Secondary | ICD-10-CM | POA: Diagnosis not present

## 2022-03-05 DIAGNOSIS — R7301 Impaired fasting glucose: Secondary | ICD-10-CM | POA: Diagnosis not present

## 2022-03-10 DIAGNOSIS — N1831 Chronic kidney disease, stage 3a: Secondary | ICD-10-CM | POA: Diagnosis not present

## 2022-03-10 DIAGNOSIS — G609 Hereditary and idiopathic neuropathy, unspecified: Secondary | ICD-10-CM | POA: Diagnosis not present

## 2022-03-10 DIAGNOSIS — K219 Gastro-esophageal reflux disease without esophagitis: Secondary | ICD-10-CM | POA: Diagnosis not present

## 2022-03-10 DIAGNOSIS — M170 Bilateral primary osteoarthritis of knee: Secondary | ICD-10-CM | POA: Diagnosis not present

## 2022-03-10 DIAGNOSIS — R7301 Impaired fasting glucose: Secondary | ICD-10-CM | POA: Diagnosis not present

## 2022-03-10 DIAGNOSIS — R945 Abnormal results of liver function studies: Secondary | ICD-10-CM | POA: Diagnosis not present

## 2022-03-10 DIAGNOSIS — I129 Hypertensive chronic kidney disease with stage 1 through stage 4 chronic kidney disease, or unspecified chronic kidney disease: Secondary | ICD-10-CM | POA: Diagnosis not present

## 2022-03-10 DIAGNOSIS — D696 Thrombocytopenia, unspecified: Secondary | ICD-10-CM | POA: Diagnosis not present

## 2022-03-10 DIAGNOSIS — E782 Mixed hyperlipidemia: Secondary | ICD-10-CM | POA: Diagnosis not present

## 2022-07-18 ENCOUNTER — Inpatient Hospital Stay (HOSPITAL_COMMUNITY)
Admission: EM | Admit: 2022-07-18 | Discharge: 2022-07-22 | DRG: 066 | Disposition: A | Discharge status: Skilled Nursing Facility | Payer: Medicare Other | Attending: Internal Medicine | Admitting: Internal Medicine

## 2022-07-18 ENCOUNTER — Emergency Department (HOSPITAL_COMMUNITY): Payer: Medicare Other

## 2022-07-18 ENCOUNTER — Other Ambulatory Visit: Payer: Self-pay

## 2022-07-18 ENCOUNTER — Encounter (HOSPITAL_COMMUNITY): Payer: Self-pay | Admitting: Emergency Medicine

## 2022-07-18 DIAGNOSIS — H532 Diplopia: Secondary | ICD-10-CM | POA: Diagnosis present

## 2022-07-18 DIAGNOSIS — Z20822 Contact with and (suspected) exposure to covid-19: Secondary | ICD-10-CM | POA: Diagnosis present

## 2022-07-18 DIAGNOSIS — H538 Other visual disturbances: Secondary | ICD-10-CM | POA: Diagnosis not present

## 2022-07-18 DIAGNOSIS — I639 Cerebral infarction, unspecified: Secondary | ICD-10-CM | POA: Diagnosis not present

## 2022-07-18 DIAGNOSIS — Z882 Allergy status to sulfonamides status: Secondary | ICD-10-CM | POA: Diagnosis not present

## 2022-07-18 DIAGNOSIS — Z885 Allergy status to narcotic agent status: Secondary | ICD-10-CM

## 2022-07-18 DIAGNOSIS — Z96643 Presence of artificial hip joint, bilateral: Secondary | ICD-10-CM | POA: Diagnosis present

## 2022-07-18 DIAGNOSIS — E876 Hypokalemia: Secondary | ICD-10-CM | POA: Diagnosis not present

## 2022-07-18 DIAGNOSIS — K219 Gastro-esophageal reflux disease without esophagitis: Secondary | ICD-10-CM | POA: Diagnosis not present

## 2022-07-18 DIAGNOSIS — E538 Deficiency of other specified B group vitamins: Secondary | ICD-10-CM | POA: Diagnosis present

## 2022-07-18 DIAGNOSIS — R2990 Unspecified symptoms and signs involving the nervous system: Secondary | ICD-10-CM | POA: Diagnosis not present

## 2022-07-18 DIAGNOSIS — I771 Stricture of artery: Secondary | ICD-10-CM | POA: Diagnosis not present

## 2022-07-18 DIAGNOSIS — Z66 Do not resuscitate: Secondary | ICD-10-CM | POA: Diagnosis present

## 2022-07-18 DIAGNOSIS — R29701 NIHSS score 1: Secondary | ICD-10-CM | POA: Diagnosis present

## 2022-07-18 DIAGNOSIS — Z111 Encounter for screening for respiratory tuberculosis: Secondary | ICD-10-CM | POA: Diagnosis not present

## 2022-07-18 DIAGNOSIS — R0902 Hypoxemia: Secondary | ICD-10-CM | POA: Diagnosis not present

## 2022-07-18 DIAGNOSIS — E7800 Pure hypercholesterolemia, unspecified: Secondary | ICD-10-CM | POA: Diagnosis present

## 2022-07-18 DIAGNOSIS — I10 Essential (primary) hypertension: Secondary | ICD-10-CM | POA: Diagnosis not present

## 2022-07-18 DIAGNOSIS — I6329 Cerebral infarction due to unspecified occlusion or stenosis of other precerebral arteries: Principal | ICD-10-CM | POA: Diagnosis present

## 2022-07-18 DIAGNOSIS — I6523 Occlusion and stenosis of bilateral carotid arteries: Secondary | ICD-10-CM | POA: Diagnosis not present

## 2022-07-18 DIAGNOSIS — Z8673 Personal history of transient ischemic attack (TIA), and cerebral infarction without residual deficits: Secondary | ICD-10-CM | POA: Diagnosis not present

## 2022-07-18 DIAGNOSIS — Z791 Long term (current) use of non-steroidal anti-inflammatories (NSAID): Secondary | ICD-10-CM

## 2022-07-18 DIAGNOSIS — H9190 Unspecified hearing loss, unspecified ear: Secondary | ICD-10-CM | POA: Diagnosis present

## 2022-07-18 DIAGNOSIS — R202 Paresthesia of skin: Secondary | ICD-10-CM | POA: Diagnosis not present

## 2022-07-18 DIAGNOSIS — E785 Hyperlipidemia, unspecified: Secondary | ICD-10-CM | POA: Diagnosis not present

## 2022-07-18 DIAGNOSIS — I6389 Other cerebral infarction: Secondary | ICD-10-CM | POA: Diagnosis not present

## 2022-07-18 DIAGNOSIS — M19012 Primary osteoarthritis, left shoulder: Secondary | ICD-10-CM | POA: Diagnosis not present

## 2022-07-18 DIAGNOSIS — H5121 Internuclear ophthalmoplegia, right eye: Secondary | ICD-10-CM | POA: Diagnosis not present

## 2022-07-18 DIAGNOSIS — I6381 Other cerebral infarction due to occlusion or stenosis of small artery: Secondary | ICD-10-CM | POA: Diagnosis not present

## 2022-07-18 DIAGNOSIS — R000 Tachycardia, unspecified: Secondary | ICD-10-CM | POA: Diagnosis not present

## 2022-07-18 DIAGNOSIS — Z87891 Personal history of nicotine dependence: Secondary | ICD-10-CM | POA: Diagnosis not present

## 2022-07-18 DIAGNOSIS — Z8249 Family history of ischemic heart disease and other diseases of the circulatory system: Secondary | ICD-10-CM

## 2022-07-18 DIAGNOSIS — Z79899 Other long term (current) drug therapy: Secondary | ICD-10-CM | POA: Diagnosis not present

## 2022-07-18 DIAGNOSIS — Z823 Family history of stroke: Secondary | ICD-10-CM

## 2022-07-18 DIAGNOSIS — M19011 Primary osteoarthritis, right shoulder: Secondary | ICD-10-CM | POA: Diagnosis not present

## 2022-07-18 DIAGNOSIS — G319 Degenerative disease of nervous system, unspecified: Secondary | ICD-10-CM | POA: Diagnosis not present

## 2022-07-18 DIAGNOSIS — R42 Dizziness and giddiness: Secondary | ICD-10-CM | POA: Diagnosis not present

## 2022-07-18 DIAGNOSIS — I959 Hypotension, unspecified: Secondary | ICD-10-CM | POA: Diagnosis not present

## 2022-07-18 LAB — ETHANOL: Alcohol, Ethyl (B): <10 mg/dL (ref <10)

## 2022-07-18 LAB — URINALYSIS, ROUTINE W REFLEX MICROSCOPIC
Bilirubin Urine: NEGATIVE
Glucose, UA: NEGATIVE mg/dL
Ketones, ur: NEGATIVE mg/dL
Nitrite: NEGATIVE
Protein, ur: NEGATIVE mg/dL
Specific Gravity, Urine: 1.003 — ABNORMAL LOW (ref 1.005–1.030)
pH: 6 (ref 5.0–8.0)

## 2022-07-18 LAB — COMPREHENSIVE METABOLIC PANEL
ALT: 13 U/L (ref 0–44)
AST: 26 U/L (ref 15–41)
Albumin: 4 g/dL (ref 3.5–5.0)
Alkaline Phosphatase: 72 U/L (ref 38–126)
Anion gap: 9 (ref 5–15)
BUN: 20 mg/dL (ref 8–23)
CO2: 23 mmol/L (ref 22–32)
Calcium: 9.1 mg/dL (ref 8.9–10.3)
Chloride: 107 mmol/L (ref 98–111)
Creatinine, Ser: 1.4 mg/dL — ABNORMAL HIGH (ref 0.44–1.00)
GFR, Estimated: 35 mL/min — ABNORMAL LOW (ref >60)
Glucose, Bld: 122 mg/dL — ABNORMAL HIGH (ref 70–99)
Potassium: 2.7 mmol/L — CL (ref 3.5–5.1)
Sodium: 139 mmol/L (ref 135–145)
Total Bilirubin: 1.8 mg/dL — ABNORMAL HIGH (ref 0.3–1.2)
Total Protein: 7.3 g/dL (ref 6.5–8.1)

## 2022-07-18 LAB — DIFFERENTIAL
Abs Immature Granulocytes: 0.02 10*3/uL (ref 0.00–0.07)
Basophils Absolute: 0 10*3/uL (ref 0.0–0.1)
Basophils Relative: 0 %
Eosinophils Absolute: 0.1 10*3/uL (ref 0.0–0.5)
Eosinophils Relative: 1 %
Immature Granulocytes: 0 %
Lymphocytes Relative: 22 %
Lymphs Abs: 1.1 10*3/uL (ref 0.7–4.0)
Monocytes Absolute: 0.5 10*3/uL (ref 0.1–1.0)
Monocytes Relative: 10 %
Neutro Abs: 3.2 10*3/uL (ref 1.7–7.7)
Neutrophils Relative %: 67 %

## 2022-07-18 LAB — RAPID URINE DRUG SCREEN, HOSP PERFORMED
Amphetamines: NOT DETECTED
Barbiturates: NOT DETECTED
Benzodiazepines: NOT DETECTED
Cocaine: NOT DETECTED
Opiates: NOT DETECTED
Tetrahydrocannabinol: NOT DETECTED

## 2022-07-18 LAB — RESP PANEL BY RT-PCR (FLU A&B, COVID) ARPGX2
Influenza A by PCR: NEGATIVE
Influenza B by PCR: NEGATIVE
SARS Coronavirus 2 by RT PCR: NEGATIVE

## 2022-07-18 LAB — CBC
HCT: 37.7 % (ref 36.0–46.0)
Hemoglobin: 12.6 g/dL (ref 12.0–15.0)
MCH: 31.6 pg (ref 26.0–34.0)
MCHC: 33.4 g/dL (ref 30.0–36.0)
MCV: 94.5 fL (ref 80.0–100.0)
Platelets: 148 10*3/uL — ABNORMAL LOW (ref 150–400)
RBC: 3.99 MIL/uL (ref 3.87–5.11)
RDW: 13.5 % (ref 11.5–15.5)
WBC: 4.9 10*3/uL (ref 4.0–10.5)
nRBC: 0 % (ref 0.0–0.2)

## 2022-07-18 LAB — CBG MONITORING, ED: Glucose-Capillary: 141 mg/dL — ABNORMAL HIGH (ref 70–99)

## 2022-07-18 LAB — PROTIME-INR
INR: 1 (ref 0.8–1.2)
Prothrombin Time: 13.3 seconds (ref 11.4–15.2)

## 2022-07-18 LAB — APTT: aPTT: 23 seconds — ABNORMAL LOW (ref 24–36)

## 2022-07-18 MED ORDER — POTASSIUM CHLORIDE 10 MEQ/100ML IV SOLN
10.0000 meq | Freq: Once | INTRAVENOUS | Status: AC
  Administered 2022-07-18: 10 meq via INTRAVENOUS
  Filled 2022-07-18: qty 100

## 2022-07-18 MED ORDER — SODIUM CHLORIDE 0.9 % IV SOLN: Freq: Once | INTRAVENOUS | Status: AC

## 2022-07-18 MED ORDER — TRAMADOL HCL 50 MG PO TABS
50.0000 mg | ORAL_TABLET | Freq: Two times a day (BID) | ORAL | Status: DC | PRN
  Administered 2022-07-18 – 2022-07-21 (×4): 50 mg via ORAL
  Filled 2022-07-18 (×4): qty 1

## 2022-07-18 MED ORDER — POTASSIUM CHLORIDE 10 MEQ/100ML IV SOLN
10.0000 meq | INTRAVENOUS | Status: AC
  Administered 2022-07-18 – 2022-07-19 (×4): 10 meq via INTRAVENOUS
  Filled 2022-07-18 (×4): qty 100

## 2022-07-18 MED ORDER — ACETAMINOPHEN 650 MG RE SUPP: 650.0000 mg | RECTAL | Status: DC | PRN

## 2022-07-18 MED ORDER — HEPARIN SODIUM (PORCINE) 5000 UNIT/ML IJ SOLN
5000.0000 [IU] | Freq: Three times a day (TID) | INTRAMUSCULAR | Status: DC
  Administered 2022-07-18 – 2022-07-22 (×11): 5000 [IU] via SUBCUTANEOUS
  Filled 2022-07-18 (×11): qty 1

## 2022-07-18 MED ORDER — ASPIRIN 81 MG PO CHEW
81.0000 mg | CHEWABLE_TABLET | Freq: Once | ORAL | Status: AC
  Administered 2022-07-18: 81 mg via ORAL
  Filled 2022-07-18: qty 1

## 2022-07-18 MED ORDER — TRAMADOL HCL 50 MG PO TABS: 50.0000 mg | ORAL_TABLET | Freq: Three times a day (TID) | ORAL | Status: DC | PRN

## 2022-07-18 MED ORDER — ROSUVASTATIN CALCIUM 20 MG PO TABS
20.0000 mg | ORAL_TABLET | Freq: Every day | ORAL | Status: DC
Start: 2022-07-19 — End: 2022-07-18

## 2022-07-18 MED ORDER — ACETAMINOPHEN 160 MG/5ML PO SOLN: 650.0000 mg | ORAL | Status: DC | PRN

## 2022-07-18 MED ORDER — ROSUVASTATIN CALCIUM 10 MG PO TABS
10.0000 mg | ORAL_TABLET | Freq: Every day | ORAL | Status: DC
  Administered 2022-07-19 – 2022-07-21 (×3): 10 mg via ORAL
  Filled 2022-07-18 (×3): qty 1

## 2022-07-18 MED ORDER — STROKE: EARLY STAGES OF RECOVERY BOOK
Freq: Once | Status: AC
  Filled 2022-07-18: qty 1

## 2022-07-18 MED ORDER — LOSARTAN POTASSIUM 50 MG PO TABS
50.0000 mg | ORAL_TABLET | Freq: Every morning | ORAL | Status: DC
  Administered 2022-07-19 – 2022-07-22 (×4): 50 mg via ORAL
  Filled 2022-07-18 (×2): qty 1
  Filled 2022-07-18: qty 2
  Filled 2022-07-18: qty 1

## 2022-07-18 MED ORDER — PANTOPRAZOLE SODIUM 40 MG PO TBEC
40.0000 mg | DELAYED_RELEASE_TABLET | Freq: Every day | ORAL | Status: DC
  Administered 2022-07-19 – 2022-07-22 (×4): 40 mg via ORAL
  Filled 2022-07-18 (×4): qty 1

## 2022-07-18 MED ORDER — ACETAMINOPHEN 325 MG PO TABS: 650.0000 mg | ORAL_TABLET | ORAL | Status: DC | PRN

## 2022-07-18 MED ORDER — SENNOSIDES-DOCUSATE SODIUM 8.6-50 MG PO TABS: 1.0000 | ORAL_TABLET | Freq: Every evening | ORAL | Status: DC | PRN

## 2022-07-18 MED ORDER — CLOPIDOGREL BISULFATE 75 MG PO TABS
75.0000 mg | ORAL_TABLET | Freq: Every day | ORAL | Status: DC
  Administered 2022-07-18 – 2022-07-22 (×5): 75 mg via ORAL
  Filled 2022-07-18 (×5): qty 1

## 2022-07-18 MED ORDER — ASPIRIN 81 MG PO CHEW
81.0000 mg | CHEWABLE_TABLET | Freq: Every day | ORAL | Status: DC
Start: 2022-07-19 — End: 2022-07-22
  Administered 2022-07-19 – 2022-07-22 (×4): 81 mg via ORAL
  Filled 2022-07-18 (×5): qty 1

## 2022-07-18 MED ORDER — ASPIRIN 300 MG RE SUPP: 300.0000 mg | Freq: Once | RECTAL | Status: AC

## 2022-07-18 NOTE — Assessment & Plan Note (Signed)
-  CT head shows remote infarcts, but no acute intracranial abnormality -MRI brain (reason for transfer to [facility]) -Echo -neuro checks per protocol -Permissive HTN -Continue crestor -Continue aspirin and plavix -PT/OT/SLP eval and treat -Continue to monitor

## 2022-07-18 NOTE — Consult Note (Signed)
NEUROLOGY TELECONSULTATION NOTE   Date of service: July 18, 2022 Patient Name: Carrie Sanders MRN:  [id number] DOB:  September 17, 1927 Reason for consult: telestroke  Requesting Provider: Dr. Bethann Berkshire Consult Participants: myself, bedside RN, telestroke RN, patient Location of the provider: [facility], [place] Location of the patient: [facility]  This consult was provided via telemedicine with 2-way video and audio communication. The patient/family was informed that care would be provided in this way and agreed to receive care in this manner.   _ _ _   _ __   _ __ _ _  __ __   _ __   __ _  History of Present Illness   This is a 88 yo woman with hx HTN, HL who was BIB EMS for acute onset eye movement abnl. LKW 1700. History provided by daughter Marylene Land. Patient was in [facility] until 1700 when daughter (a Charity fundraiser) noticed that her eyes were not moving normally. On my examination patient has an isolated R INO with no other deficits, NIHSS = 0. Head CT NAICP (personal review). Patient is too hard of hearing to have conversation over tele. I discussed the findings with daughter Marylene Land and told her I suspected patient was having a small brainstem stroke, but with a NIHSS of 1 and no other sx other than INO, I did not recommend thrombolytics, esp in a 88 yo. Daughter is in agreement. Exam not c/w LVO therefore CTA not performed as part of the stroke code.   ROS   Per HPI; all other systems reviewed and are negative  Past History   The following was personally reviewed:  Past Medical History:  Diagnosis Date   Arthritis    Constipation    Gait disorder 05/26/2016   H/O left breast biopsy    High cholesterol    Hypertension    Paresthesia 05/26/2016   Pessary maintenance    Sinus drainage    Sleeping difficulty    Past Surgical History:  Procedure Laterality Date   APPENDECTOMY     AGE 88   CATARACTS REMOVED     JOINT REPLACEMENT  1998   RT TOTAL HIP   TOTAL HIP ARTHROPLASTY  03/17/2012    Procedure: TOTAL HIP ARTHROPLASTY ANTERIOR APPROACH;  Surgeon: Shelda Pal, MD;  Location: [facility];  Service: Orthopedics;  Laterality: Left;   Family History  Problem Relation Age of Onset   Heart disease Brother    Stroke Mother    Cerebral aneurysm Sister    Social History   Socioeconomic History   Marital status: Married    Spouse name: Not on file   Number of children: 2   Years of education: RN   Highest education level: Not on file  Occupational History   Occupation: Retired    Comment: Charity fundraiser  Tobacco Use   Smoking status: Former    Types: Cigarettes    Quit date: 03/10/1959    Years since quitting: 63.4   Smokeless tobacco: Never  Substance and Sexual Activity   Alcohol use: No   Drug use: No   Sexual activity: Not on file  Other Topics Concern   Not on file  Social History Narrative   Lives at home w/ her husband   Right-handed   Drinks 1-2 cups of coffee per day   Social Determinants of Health   Financial Resource Strain: Not on file  Food Insecurity: Not on file  Transportation Needs: Not on file  Physical Activity: Not on file  Stress: Not on file  Social Connections: Not on file   Allergies  Allergen Reactions   Codeine Nausea Only and Other (See Comments)    Pass out    Sulfonamide Derivatives Rash    Medications   (Not in a hospital admission)    No current facility-administered medications for this encounter.  Current Outpatient Medications:    traMADol (ULTRAM) 50 MG tablet, Take 1 tablet by mouth every 8 (eight) hours as needed., Disp: , Rfl:    bumetanide (BUMEX) 1 MG tablet, Take 1 mg by mouth See admin instructions. Takes 1mg  (one tablet) every 3-4 days, Disp: , Rfl:    gabapentin (NEURONTIN) 100 MG capsule, Take 1 capsule (100 mg total) by mouth 3 (three) times daily. (Patient not taking: No sig reported), Disp: 90 capsule, Rfl: 1   losartan (COZAAR) 100 MG tablet, Take 50 mg by mouth every morning., Disp: , Rfl:    meloxicam  (MOBIC) 7.5 MG tablet, Take 7.5 mg by mouth every morning., Disp: , Rfl:    omeprazole (PRILOSEC) 20 MG capsule, Take 20 mg by mouth daily. , Disp: , Rfl: 0   rosuvastatin (CRESTOR) 20 MG tablet, Take 20 mg by mouth daily after breakfast., Disp: , Rfl:    traMADol (ULTRAM) 50 MG tablet, Take 50 mg by mouth at bedtime., Disp: , Rfl:   Vitals   There were no vitals filed for this visit.   There is no height or weight on file to calculate BMI.  Physical Exam   Exam performed over telemedicine with 2-way video and audio communication and with assistance of bedside RN  Physical Exam Gen: A&O x4, NAD Resp: normal WOB CV: extremities appear well-perfused  Neuro: *MS: A&O x4. Follows multi-step commands.  *Speech: nondysarthric, no aphasia, able to name and repeat *CN: PERRL 50mm, R INO, VFF by confrontation, sensation intact, smile symmetric, hearing intact to voice *Motor:   Normal bulk.  No tremor, rigidity or bradykinesia. No pronator drift. All extremities appear full-strength and symmetric. *Sensory: SILT. Symmetric. No double-simultaneous extinction.  *Coordination:  Finger-to-nose, heel-to-shin, rapid alternating motions were intact. *Reflexes:  UTA 2/2 tele-exam *Gait: deferred  NIHSS = 1 for gaze  Premorbid mRS = 1   Labs   CBC: No results for input(s): "WBC", "NEUTROABS", "HGB", "HCT", "MCV", "PLT" in the last 168 hours.  Basic Metabolic Panel:  Lab Results  Component Value Date   NA 141 05/04/2021   K 3.4 (L) 05/04/2021   CO2 26 05/04/2021   GLUCOSE 113 (H) 05/04/2021   BUN 19 05/04/2021   CREATININE 1.26 (H) 05/04/2021   CALCIUM 9.3 05/04/2021   GFRNONAA 40 (L) 05/04/2021   GFRAA 48 (L) 07/20/2016   Lipid Panel: No results found for: "LDLCALC" HgbA1c: No results found for: "HGBA1C" Urine Drug Screen: No results found for: "LABOPIA", "COCAINSCRNUR", "LABBENZ", "AMPHETMU", "THCU", "LABBARB"  Alcohol Level No results found for: "ETH"   Impression   This is  a 88 yo woman with hx HTN, HL who was BIB EMS for acute onset R INO favored to be 2/2 small brainstem stroke. Sx were deemed to mild to treat with TNK, see discussion in HPI. She will be [facility] to [facility] for stroke workup.  Recommendations   - Admit for stroke workup to [facility] - Continue vitals and NIHSS both q 30 min until patient is outside the TNK window (9:30pm). If exam worsens, re-activate telestroke - Permissive HTN x48 hrs from sx onset or until stroke ruled out by MRI  goal BP <220/110. PRN labetalol or hydralazine if BP above these parameters. Avoid oral antihypertensives. - MRI brain wo contrast - CTA or MRA H&N - NPO until swallow screen is passed and documented - TTE  - Check A1c and LDL + add statin per guidelines - ASA 81mg  daily + plavix 75mg  daily x21 days f/b ASA 81mg  daily monotherapy after that - q4 hr neuro checks - STAT head CT for any change in neuro exam - Tele - PT/OT/SLP - Stroke education - Amb referral to neurology upon discharge   Please notify [facility] neurohospitalist when patient arrives  ______________________________________________________________________   Thank you for the opportunity to take part in the care of this patient. If you have any further questions, please contact the neurology consultation attending.  Signed,  , MD [facility] [phone]  If 7pm- 7am, please page neurology on call as listed in AMION.

## 2022-07-18 NOTE — Assessment & Plan Note (Signed)
-  permissive HTN for 24-48 hours -Treat > 210/110 with as needed labetalol -Follow-up vital signs. -Heart healthy diet ordered.

## 2022-07-18 NOTE — ED Provider Notes (Signed)
[facility] EMERGENCY DEPARTMENT Provider Note   CSN: [id number] Arrival date & time: 07/18/22  1726  An emergency department physician performed an initial assessment on this suspected stroke patient at 1730.  History {Add pertinent medical, surgical, social history, OB history to HPI:1} Chief Complaint  Patient presents with   Code Stroke    Carrie Sanders is a 88 y.o. female.  Patient started with some numbness in her face and also blurred vision when she looks to the left.   Eye Problem      Home Medications Prior to Admission medications   Medication Sig Start Date End Date Taking? Authorizing Provider  traMADol (ULTRAM) 50 MG tablet Take 1 tablet by mouth every 8 (eight) hours as needed. 03/10/22  Yes [provider]  bumetanide (BUMEX) 1 MG tablet Take 1 mg by mouth See admin instructions. Takes 1mg  (one tablet) every 3-4 days    [provider]  gabapentin (NEURONTIN) 100 MG capsule Take 1 capsule (100 mg total) by mouth 3 (three) times daily. Patient not taking: No sig reported 05/26/16   05/28/16, MD  losartan (COZAAR) 100 MG tablet Take 50 mg by mouth every morning.    [provider]  meloxicam (MOBIC) 7.5 MG tablet Take 7.5 mg by mouth every morning. 03/18/21   [provider]  omeprazole (PRILOSEC) 20 MG capsule Take 20 mg by mouth daily.  05/22/16   [provider]  rosuvastatin (CRESTOR) 20 MG tablet Take 20 mg by mouth daily after breakfast.    [provider]  traMADol (ULTRAM) 50 MG tablet Take 50 mg by mouth at bedtime.    [provider]      Allergies    Codeine and Sulfonamide derivatives    Review of Systems   Review of Systems  Physical Exam Updated Vital Signs BP (!) 154/98   Pulse 100   Temp 98 F (36.7 C) (Oral)   Resp 18   Ht 5\' 1"  (1.549 m)   Wt 54.4 kg   SpO2 97%   BMI 22.66 kg/m  Physical Exam  ED Results / Procedures / Treatments   Labs (all labs ordered  are listed, but only abnormal results are displayed) Labs Reviewed  APTT - Abnormal; Notable for the following components:      Result Value   aPTT 23 (*)    All other components within normal limits  CBC - Abnormal; Notable for the following components:   Platelets 148 (*)    All other components within normal limits  COMPREHENSIVE METABOLIC PANEL - Abnormal; Notable for the following components:   Potassium 2.7 (*)    Glucose, Bld 122 (*)    Creatinine, Ser 1.40 (*)    Total Bilirubin 1.8 (*)    GFR, Estimated 35 (*)    All other components within normal limits  CBG MONITORING, ED - Abnormal; Notable for the following components:   Glucose-Capillary 141 (*)    All other components within normal limits  RESP PANEL BY RT-PCR (FLU A&B, COVID) ARPGX2  ETHANOL  PROTIME-INR  DIFFERENTIAL  RAPID URINE DRUG SCREEN, HOSP PERFORMED  URINALYSIS, ROUTINE W REFLEX MICROSCOPIC    EKG None  Radiology CT HEAD CODE STROKE WO CONTRAST  Result Date: 07/18/2022 CLINICAL DATA:  Code stroke.  Neuro deficit, stroke suspected EXAM: CT HEAD WITHOUT CONTRAST TECHNIQUE: Contiguous axial images were obtained from the base of the skull through the vertex without intravenous contrast. RADIATION DOSE REDUCTION: This exam was  performed according to the departmental dose-optimization program which includes automated exposure control, adjustment of the mA and/or kV according to patient size and/or use of iterative reconstruction technique. COMPARISON:  None Available. FINDINGS: Brain: There is no acute intracranial hemorrhage, extra-axial fluid collection, or acute infarct. Background parenchymal volume is normal for age. The ventricles are normal in size. A remote infarct in the right basal ganglia is unchanged. Small lacunar infarcts in the left cerebellar hemisphere are new since 06/05/2022j but are still remote appearing gray-white differentiation is otherwise preserved. There is no mass effect or midline  shift.  There is no mass lesion Vascular: There is calcification of the bilateral cavernous ICAs and vertebral arteries. Skull: Normal. Negative for fracture or focal lesion. Sinuses/Orbits: The imaged paranasal sinuses are clear. Bilateral lens implants are in place. The globes and orbits are otherwise unremarkable. Other: None. ASPECTS [facility] Stroke Program Early CT Score) - Ganglionic level infarction (caudate, lentiform nuclei, internal capsule, insula, M1-M3 cortex): 7 - Supraganglionic infarction (M4-M6 cortex): 3 Total score (0-10 with 10 being normal): 10 IMPRESSION: 1. No acute intracranial pathology. 2. ASPECTS is 10 3. Small infarcts in the left cerebellar hemisphere are new since 2022 but are still remote in appearance; MRI may be considered for better characterization. A remote infarct in the right basal ganglia is unchanged. These results were called by telephone at the time of interpretation on 07/18/2022 at 5:46 pm to provider [facility] Eriel Dunckel , who verbally acknowledged these results. Electronically Signed   By: Lesia Hausen M.D.   On: 07/18/2022 17:53    Procedures Procedures  {Document cardiac monitor, telemetry assessment procedure when appropriate:1}  Medications Ordered in ED Medications  aspirin chewable tablet 81 mg (has no administration in time range)  clopidogrel (PLAVIX) tablet 75 mg (75 mg Oral Given 07/18/22 1851)  potassium chloride 10 mEq in 100 mL IVPB (10 mEq Intravenous New Bag/Given 07/18/22 1920)  potassium chloride 10 mEq in 100 mL IVPB (10 mEq Intravenous New Bag/Given 07/18/22 1920)  aspirin chewable tablet 81 mg (81 mg Oral Given 07/18/22 1851)    Or  aspirin suppository 300 mg ( Rectal See Alternative 07/18/22 1851)  0.9 %  sodium chloride infusion ( Intravenous New Bag/Given 07/18/22 1927)    ED Course/ Medical Decision Making/ A&P  CRITICAL CARE Performed by: Bethann Berkshire Total critical care time: 40 minutes Critical care time was exclusive of separately  billable procedures and treating other patients. Critical care was necessary to treat or prevent imminent or life-threatening deterioration. Critical care was time spent personally by me on the following activities: development of treatment plan with patient and/or surrogate as well as nursing, discussions with consultants, evaluation of patient's response to treatment, examination of patient, obtaining history from patient or surrogate, ordering and performing treatments and interventions, ordering and review of laboratory studies, ordering and review of radiographic studies, pulse oximetry and re-evaluation of patient's condition.   Neurology saw the patient and it was decided not to give the patient any thrombolytics.  She will be admitted to medicine at [facility] with neurology consult and will get MRI                         Medical Decision Making Amount and/or Complexity of Data Reviewed Labs: ordered. Radiology: ordered.  Risk Prescription drug management. Decision regarding hospitalization.  Patient with a right INO.  She will be admitted to medicine at [facility] and have stroke work-up {Document critical care  time when appropriate:1} {Document review of labs and clinical decision tools ie heart score, Chads2Vasc2 etc:1}  {Document your independent review of radiology images, and any outside records:1} {Document your discussion with family members, caretakers, and with consultants:1} {Document social determinants of health affecting pt's care:1} {Document your decision making why or why not admission, treatments were needed:1} Final Clinical Impression(s) / ED Diagnoses Final diagnoses:  None    Rx / DC Orders ED Discharge Orders     None

## 2022-07-18 NOTE — ED Triage Notes (Signed)
Pt to the ED via [facility] from home where she was using the bathroom and had a normal bowel movement with reportedly no straining.  The pt became weak dizzy and noticed bilateral facial tingling.  Speech is normal and she is A&O x4 with no facial droop.  Pt has abnormal occular movements and specifically when she looks left her right arm remains stationary. When the pt looks to the right both eyes move to the right.

## 2022-07-18 NOTE — ED Notes (Signed)
The neurologist is not recommending thrombolytic intervention at this time.

## 2022-07-18 NOTE — Assessment & Plan Note (Signed)
Continue protonix  

## 2022-07-18 NOTE — ED Notes (Signed)
EMS called CODE STROKE @ 1720. Beeped and called out  @ 1720 to CT and LAB.

## 2022-07-18 NOTE — H&P (Signed)
History and Physical    Patient: Carrie Sanders ZOX:[id number] DOB: 1927-01-14 DOA: 07/18/2022 DOS: the patient was seen and examined on 07/18/2022 PCP: Benita Stabile, MD  Patient coming from: {Point_of_Origin:26777}  Chief Complaint:  Chief Complaint  Patient presents with   Code Stroke   HPI: BRYANNA Sanders is a 88 y.o. female with medical history significant of ***  Review of Systems: {ROS_Text:26778} Past Medical History:  Diagnosis Date   Arthritis    Constipation    Gait disorder 05/26/2016   H/O left breast biopsy    High cholesterol    Hypertension    Paresthesia 05/26/2016   Pessary maintenance    Sinus drainage    Sleeping difficulty    Past Surgical History:  Procedure Laterality Date   APPENDECTOMY     AGE 57   CATARACTS REMOVED     JOINT REPLACEMENT  1998   RT TOTAL HIP   TOTAL HIP ARTHROPLASTY  03/17/2012   Procedure: TOTAL HIP ARTHROPLASTY ANTERIOR APPROACH;  Surgeon: Shelda Pal, MD;  Location: [facility] ORS;  Service: Orthopedics;  Laterality: Left;   Social History:  reports that she quit smoking about 63 years ago. Her smoking use included cigarettes. She has never used smokeless tobacco. She reports that she does not drink alcohol and does not use drugs.  Allergies  Allergen Reactions   Codeine Nausea Only and Other (See Comments)    Pass out    Sulfonamide Derivatives Rash    Family History  Problem Relation Age of Onset   Heart disease Brother    Stroke Mother    Cerebral aneurysm Sister     Prior to Admission medications   Medication Sig Start Date End Date Taking? Authorizing Provider  traMADol (ULTRAM) 50 MG tablet Take 1 tablet by mouth every 8 (eight) hours as needed. 03/10/22  Yes [provider]  bumetanide (BUMEX) 1 MG tablet Take 1 mg by mouth See admin instructions. Takes 1mg  (one tablet) every 3-4 days    [provider]  gabapentin (NEURONTIN) 100 MG capsule Take 1 capsule (100 mg total) by mouth 3 (three) times  daily. Patient not taking: No sig reported 05/26/16   05/28/16, MD  losartan (COZAAR) 100 MG tablet Take 50 mg by mouth every morning.    [provider]  meloxicam (MOBIC) 7.5 MG tablet Take 7.5 mg by mouth every morning. 03/18/21   [provider]  omeprazole (PRILOSEC) 20 MG capsule Take 20 mg by mouth daily.  05/22/16   [provider]  rosuvastatin (CRESTOR) 20 MG tablet Take 20 mg by mouth daily after breakfast.    [provider]  traMADol (ULTRAM) 50 MG tablet Take 50 mg by mouth at bedtime.    [provider]    Physical Exam: Vitals:   07/18/22 1800 07/18/22 1830 07/18/22 1900 07/18/22 2000  BP: (!) 159/85 (!) 154/98 (!) 159/101 (!) 192/92  Pulse: 95 100 94 99  Resp: 14 18 14 18   Temp:      TempSrc:      SpO2: 97% 97% 95% 95%  Weight:      Height:       1.  General: Patient lying supine in bed,  no acute distress   2. Psychiatric: Alert and oriented x 3, mood and behavior normal for situation, pleasant and cooperative with exam   3. Neurologic: Speech and language are normal, face is symmetric, moves all 4 extremities voluntarily, at baseline without acute  deficits on limited exam   4. HEENMT:  Head is atraumatic, normocephalic, pupils reactive to light, right eye does not track medially past midline, neck is supple, trachea is midline, mucous membranes are moist   5. Respiratory : Lungs are clear to auscultation bilaterally without wheezing, rhonchi, rales, no cyanosis, no increase in work of breathing or accessory muscle use   6. Cardiovascular : Heart rate normal, rhythm is regular, no murmurs, rubs or gallops, no peripheral edema, peripheral pulses palpated   7. Gastrointestinal:  Abdomen is soft, nondistended, nontender to palpation bowel sounds active, no masses or organomegaly palpated   8. Skin:  Skin is warm, dry and intact without rashes, acute lesions, or ulcers on limited exam   9.Musculoskeletal:   No acute deformities or trauma, atrophy of left hand, no peripheral edema, peripheral pulses palpated, no calf tenderness  Data Reviewed: {Tip this will not be part of the note when signed- Document your independent interpretation of telemetry tracing, EKG, lab, Radiology test or any other diagnostic tests. Add any new diagnostic test ordered today. (Optional):26781} {Results:26384}  Assessment and Plan: * Stroke [facility]) -CT head shows remote infarcts, but no acute intracranial abnormality -MRI brain (reason for transfer to [facility]) -Echo -neuro checks per protocol -Permissive HTN -Continue crestor -Continue aspirin and plavix -PT/OT/SLP eval and treat -Continue to monitor  GERD (gastroesophageal reflux disease) -Continue protonix   HLD (hyperlipidemia) -Continue crestor  HTN (hypertension) -permissive HTN for 24 hours -Treat > 220/110 -Continue to monitor      Advance Care Planning:   Code Status: DNR ***  Consults: neuro  Family Communication: daughter   Severity of Illness: The appropriate patient status for this patient is OBSERVATION. Observation status is judged to be reasonable and necessary in order to provide the required intensity of service to ensure the patient's safety. The patient's presenting symptoms, physical exam findings, and initial radiographic and laboratory data in the context of their medical condition is felt to place them at decreased risk for further clinical deterioration. Furthermore, it is anticipated that the patient will be medically stable for discharge from the hospital within 2 midnights of admission.   Author: Lilyan Gilford, DO 07/18/2022 8:38 PM  For on call review [email or web address].

## 2022-07-18 NOTE — Consult Note (Signed)
17:21 :EMS code stroke pre alert  17:35 Dr Selina Cooley on screen  MRS:1 17:29 TS paged 1734 Pt to CT 17:37 Pt back from CT.

## 2022-07-18 NOTE — Assessment & Plan Note (Signed)
-  Continue crestor -Lipid panel demonstrating LDL 79, HDL 97, triglycerides 70 and total cholesterol 190.

## 2022-07-19 ENCOUNTER — Other Ambulatory Visit: Payer: Self-pay

## 2022-07-19 ENCOUNTER — Observation Stay (HOSPITAL_COMMUNITY): Payer: Medicare Other

## 2022-07-19 ENCOUNTER — Other Ambulatory Visit (HOSPITAL_COMMUNITY): Payer: Medicare Other

## 2022-07-19 DIAGNOSIS — E876 Hypokalemia: Secondary | ICD-10-CM

## 2022-07-19 DIAGNOSIS — H538 Other visual disturbances: Secondary | ICD-10-CM | POA: Diagnosis not present

## 2022-07-19 DIAGNOSIS — E785 Hyperlipidemia, unspecified: Secondary | ICD-10-CM

## 2022-07-19 DIAGNOSIS — I10 Essential (primary) hypertension: Secondary | ICD-10-CM

## 2022-07-19 DIAGNOSIS — I771 Stricture of artery: Secondary | ICD-10-CM | POA: Diagnosis not present

## 2022-07-19 DIAGNOSIS — I6523 Occlusion and stenosis of bilateral carotid arteries: Secondary | ICD-10-CM | POA: Diagnosis not present

## 2022-07-19 DIAGNOSIS — K219 Gastro-esophageal reflux disease without esophagitis: Secondary | ICD-10-CM | POA: Diagnosis not present

## 2022-07-19 DIAGNOSIS — I6389 Other cerebral infarction: Secondary | ICD-10-CM

## 2022-07-19 DIAGNOSIS — Z8673 Personal history of transient ischemic attack (TIA), and cerebral infarction without residual deficits: Secondary | ICD-10-CM | POA: Diagnosis not present

## 2022-07-19 DIAGNOSIS — R2990 Unspecified symptoms and signs involving the nervous system: Secondary | ICD-10-CM | POA: Diagnosis present

## 2022-07-19 LAB — ECHOCARDIOGRAM COMPLETE
AR max vel: 1.85 cm2
AV Area VTI: 1.63 cm2
AV Area mean vel: 1.68 cm2
AV Mean grad: 6 mmHg
AV Peak grad: 10.2 mmHg
Ao pk vel: 1.6 m/s
Area-P 1/2: 7.29 cm2
Calc EF: 51.5 %
Height: 61 in
MV VTI: 2.4 cm2
S' Lateral: 2.1 cm
Single Plane A2C EF: 52 %
Single Plane A4C EF: 51.8 %
Weight: 1918.88 oz

## 2022-07-19 LAB — COMPREHENSIVE METABOLIC PANEL
ALT: 12 U/L (ref 0–44)
AST: 23 U/L (ref 15–41)
Albumin: 3.7 g/dL (ref 3.5–5.0)
Alkaline Phosphatase: 64 U/L (ref 38–126)
Anion gap: 5 (ref 5–15)
BUN: 17 mg/dL (ref 8–23)
CO2: 25 mmol/L (ref 22–32)
Calcium: 9.1 mg/dL (ref 8.9–10.3)
Chloride: 114 mmol/L — ABNORMAL HIGH (ref 98–111)
Creatinine, Ser: 1.19 mg/dL — ABNORMAL HIGH (ref 0.44–1.00)
GFR, Estimated: 43 mL/min — ABNORMAL LOW (ref >60)
Glucose, Bld: 89 mg/dL (ref 70–99)
Potassium: 4.1 mmol/L (ref 3.5–5.1)
Sodium: 144 mmol/L (ref 135–145)
Total Bilirubin: 1.6 mg/dL — ABNORMAL HIGH (ref 0.3–1.2)
Total Protein: 6.6 g/dL (ref 6.5–8.1)

## 2022-07-19 LAB — LIPID PANEL
Cholesterol: 190 mg/dL (ref 0–200)
HDL: 97 mg/dL (ref >40)
LDL Cholesterol: 79 mg/dL (ref 0–99)
Total CHOL/HDL Ratio: 2 RATIO
Triglycerides: 70 mg/dL (ref <150)
VLDL: 14 mg/dL (ref 0–40)

## 2022-07-19 LAB — CBC
HCT: 37.6 % (ref 36.0–46.0)
Hemoglobin: 12.4 g/dL (ref 12.0–15.0)
MCH: 31.6 pg (ref 26.0–34.0)
MCHC: 33 g/dL (ref 30.0–36.0)
MCV: 95.7 fL (ref 80.0–100.0)
Platelets: 134 10*3/uL — ABNORMAL LOW (ref 150–400)
RBC: 3.93 MIL/uL (ref 3.87–5.11)
RDW: 13.6 % (ref 11.5–15.5)
WBC: 5.6 10*3/uL (ref 4.0–10.5)
nRBC: 0 % (ref 0.0–0.2)

## 2022-07-19 LAB — VITAMIN B12: Vitamin B-12: 123 pg/mL — ABNORMAL LOW (ref 180–914)

## 2022-07-19 LAB — HEMOGLOBIN A1C
Hgb A1c MFr Bld: 5.3 % (ref 4.8–5.6)
Mean Plasma Glucose: 105.41 mg/dL

## 2022-07-19 MED ORDER — LABETALOL HCL 5 MG/ML IV SOLN
20.0000 mg | Freq: Four times a day (QID) | INTRAVENOUS | Status: DC | PRN
Start: 2022-07-19 — End: 2022-07-22
  Administered 2022-07-19: 20 mg via INTRAVENOUS
  Filled 2022-07-19: qty 4

## 2022-07-19 NOTE — Progress Notes (Signed)
*  PRELIMINARY RESULTS* Echocardiogram 2D Echocardiogram has been performed.  Carrie Sanders 07/19/2022, 10:57 AM

## 2022-07-19 NOTE — Progress Notes (Signed)
Progress Note   Patient: Carrie Sanders DOB: 06-25-1927 DOA: 07/18/2022     0 DOS: the patient was seen and examined on 07/19/2022   Brief hospital admission course: As per H&P written by Dr.Zierle-Ghosh on 07/18/22 Carrie Sanders is a 88 y.o. female with medical history significant of arthritis, hyperlipidemia, hypertension, and more presents to ED with a chief complaint of blurry vision.  Patient reports that she was in her normal state of health, when around 4 PM on August 19 she suddenly became very dizzy.  She reports that she lost her balance and almost fell.  Likely she was in the bathroom where she could just sit on the commode.  She reports that every time she looks left she became very dizzy and had double vision or blurred vision.  Patient reports no headache with this.  She had no difficulty speaking, no difficulty swallowing.  She did not have any asymmetric weakness, or numbness.  She denies numbness on the face.  Patient reports that she has never had a stroke.  She does have a strong family history of aneurysms of the brain per her report.  Code stroke was called and she scored a 1 on NIHSS for gaze.  Neurology recommended stroke work-up at [facility].  Neurochecks every 30 minutes until patient is out of TNK window which was 9:30 PM August 19 per neuro.  Permissive hypertension for 48 hours.  Of the brain.  MRA head and neck.  Echo.  Aspirin 81 mg daily and Plavix 75 mg daily for 21 days.   Patient does not smoke, does not drink.  She is vaccinated for COVID.  Patient is DNR.  Assessment and Plan: * Stroke [facility]) -CT head shows remote infarcts, but no acute intracranial abnormality or bleeding. -Case discussed with neurology service who feel patient may have a small brainstem stroke -Recommend okay to keep patient at [facility], to check MRI/MRA of the brain; carotid Dopplers and 2D echo. -Continue aspirin and Plavix for secondary prevention -Continue statin. -Follow  recommendation by PT, OT and speech therapy eval. -Hemodynamically stable. -Allowing permissive hypertension.   Stroke-like symptoms - With concern for brainstem stroke -Continue aspirin and Plavix for secondary prevention -allowing Permissive hypertension -Completed stroke work-up and follow neurology recommendations.  Hypokalemia - Potassium 2.7 at time of admission -Electrolytes have been repleted and within normal limits currently. -Continue to follow trend  GERD (gastroesophageal reflux disease) -Continue protonix   HLD (hyperlipidemia) -Continue crestor -Lipid panel demonstrating LDL 79, HDL 97, triglycerides 70 and total cholesterol 190.  HTN (hypertension) -permissive HTN for 24-48 hours -Treat > 210/110 with as needed labetalol -Follow-up vital signs. -Heart healthy diet ordered.    Subjective:  Afebrile, no chest pain, no palpitation, no shortness of breath.  Patient reports mild difficulty with extraocular muscle movements (right more than left) but reported improved in comparison to how she fell at the moment of admission.  Physical Exam: Vitals:   07/19/22 0430 07/19/22 0500 07/19/22 0530 07/19/22 0630  BP: (!) 181/97 (!) 183/87 (!) 177/98 (!) 187/104  Pulse: 91 94 91 89  Resp: 16 18 18 18   Temp:   98.1 F (36.7 C)   TempSrc:      SpO2: 95% 96% 97% 95%  Weight:      Height:       General exam: Alert, awake, oriented x 3; no pronator drift; preserved muscle strength and overall coordination.  Speaking in full sentences without dysarthria and no  having facial droop. Respiratory system: Clear to auscultation. Respiratory effort normal. Cardiovascular system:RRR. No rubs, gallops or JVD. Gastrointestinal system: Abdomen is nondistended, soft and nontender. No organomegaly or masses felt. Normal bowel sounds heard. Central nervous system: Alert and oriented.  Abnormal movement appreciated in her right eye when assessing extraocular muscles mobility; patient  reports feeling out of focus when trying to look to certain objects.  No headaches. Extremities: No cyanosis, clubbing or edema. Skin: No petechiae. Psychiatry: Judgement and insight appear normal. Mood & affect appropriate.   Data Reviewed: CBC: WBCs 5.6, hemoglobin 12.4, platelet count 134 K.  MCV 95.7 Complete metabolic panel: Lipid panel:  Family Communication: no family at bedside.  Disposition: Status is: Observation The patient remains OBS appropriate and will d/c before 2 midnights.   Planned Discharge Destination: to be determined; but most likely home with home health services.    Author: Vassie Loll, MD 07/19/2022 7:14 AM  For on call review [email or web address].

## 2022-07-19 NOTE — Progress Notes (Signed)
Per neurologist on call at [facility] (Dr. Iver Nestle)- Plan was for transfer as there was no MRI / neurology available over the weekend at [facility]. Since she has not yet been transferred, she can get MRI tomorrow and Dr. Melynda Ripple can see her by tele. So if she's not already en route, we can cancel transfer.  Carrie Salles, MD. [facility]. 07/19/22 7:58 PM  LOS- NO CHARGE.

## 2022-07-19 NOTE — Assessment & Plan Note (Signed)
-  With concern for brainstem stroke -Continue aspirin and Plavix for secondary prevention; with subsequent plan after 21 days to pursued the use of only baby aspirin for secondary prevention. -Outpatient follow-up with neurology service.

## 2022-07-19 NOTE — ED Notes (Signed)
Patient sat up in the bed for breakfast at this time.

## 2022-07-19 NOTE — Care Management Obs Status (Signed)
MEDICARE OBSERVATION STATUS NOTIFICATION   Patient Details  Name: RILEE KNOLL MRN: [id number] Date of Birth: 28-Aug-1927   Medicare Observation Status Notification Given:  Yes    Leitha Bleak, RN 07/19/2022, 4:47 PM

## 2022-07-19 NOTE — Evaluation (Signed)
Physical Therapy Evaluation Patient Details Name: Carrie Sanders MRN: [id number] DOB: 09-20-1927 Today's Date: 07/19/2022  History of Present Illness  Carrie Sanders is a 88 y.o. female with medical history significant of arthritis, hyperlipidemia, hypertension, and more presents to ED with a chief complaint of blurry vision.  Patient reports that she was in her normal state of health, when around 4 PM on August 19 she suddenly became very dizzy.  She reports that she lost her balance and almost fell.  Likely she was in the bathroom where she could just sit on the commode.  She reports that every time she looks left she became very dizzy and had double vision or blurred vision.  Patient reports no headache with this.  She had no difficulty speaking, no difficulty swallowing.  She did not have any asymmetric weakness, or numbness.  She denies numbness on the face.  Patient reports that she has never had a stroke.  She does have a strong family history of aneurysms of the brain per her report.  Code stroke was called and she scored a 1 on NIHSS for gaze.  Neurology recommended stroke work-up at [facility].  Neurochecks every 30 minutes until patient is out of TNK window which was 9:30 PM August 19 per neuro.  Permissive hypertension for 48 hours.  Of the brain.  MRA head and neck.  Echo.  Aspirin 81 mg daily and Plavix 75 mg daily for 21 days    Clinical Impression  Patient limited for functional mobility as stated below secondary to BLE weakness, fatigue and poor standing balance.  Patient does not require much assist for bed mobility and demonstrates good sitting tolerance and sitting balance EOB. While still seated today, patient stating bilateral knee pain and weakness causing balance deficits at baseline in weightbearing. Patient requires min assist to transfer to standing with RW and is slightly unsteady upon standing. She ambulates with heavy UE use on RW and min/mod unsteadiness requiring assist  for balance. Patient quickly fatigues today with mobility and is assisted back to bed at end of session. Patient may be able to return home if family able to assist or will need rehab to improve strength and endurance to return home. Patient will benefit from continued physical therapy in hospital and recommended venue below to increase strength, balance, endurance for safe ADLs and gait.        Recommendations for follow up therapy are one component of a multi-disciplinary discharge planning process, led by the attending physician.  Recommendations may be updated based on patient status, additional functional criteria and insurance authorization.  Follow Up Recommendations Skilled nursing-short term rehab (<3 hours/day) Can patient physically be transported by private vehicle: Yes    Assistance Recommended at Discharge Intermittent Supervision/Assistance  Patient can return home with the following  A little help with walking and/or transfers;A little help with bathing/dressing/bathroom;Assistance with cooking/housework    Equipment Recommendations None recommended by PT  Recommendations for Other Services       Functional Status Assessment Patient has had a recent decline in their functional status and demonstrates the ability to make significant improvements in function in a reasonable and predictable amount of time.     Precautions / Restrictions Precautions Precautions: Fall Restrictions Weight Bearing Restrictions: No      Mobility  Bed Mobility Overal bed mobility: Needs Assistance Bed Mobility: Supine to Sit, Sit to Supine     Supine to sit: Min guard, HOB elevated Sit to supine: Min  assist        Transfers Overall transfer level: Needs assistance Equipment used: Rolling walker (2 wheels) Transfers: Sit to/from Stand Sit to Stand: Min assist                Ambulation/Gait Ambulation/Gait assistance: Min guard, Min assist Gait Distance (Feet): 30  Feet Assistive device: Rolling walker (2 wheels) Gait Pattern/deviations: Decreased stride length       General Gait Details: unsteady with RW, decreased bilateral LE motor control  Stairs            Wheelchair Mobility    Modified Rankin (Stroke Patients Only)       Balance Overall balance assessment: Needs assistance Sitting-balance support: No upper extremity supported, Feet supported Sitting balance-Leahy Scale: Good Sitting balance - Comments: seated EOB   Standing balance support: Bilateral upper extremity supported, Reliant on assistive device for balance Standing balance-Leahy Scale: Poor Standing balance comment: fair/poor                             Pertinent Vitals/Pain Pain Assessment Pain Assessment: No/denies pain    Home Living Family/patient expects to be discharged to:: Private residence Living Arrangements: Alone Available Help at Discharge: Personal care attendant;Family (PCA 3x/week) Type of Home: House Home Access: Stairs to enter Entrance Stairs-Rails: Left Entrance Stairs-Number of Steps: 2   Home Layout: One level Home Equipment: Agricultural consultant (2 wheels);Rollator (4 wheels);Shower seat      Prior Function Prior Level of Function : Independent/Modified Independent             Mobility Comments: Patient states household ambulation with rollator, uses RW outside ADLs Comments: independent     Hand Dominance        Extremity/Trunk Assessment   Upper Extremity Assessment Upper Extremity Assessment: Defer to OT evaluation    Lower Extremity Assessment Lower Extremity Assessment: Overall WFL for tasks assessed;Generalized weakness    Cervical / Trunk Assessment Cervical / Trunk Assessment: Kyphotic  Communication   Communication: No difficulties  Cognition Arousal/Alertness: Awake/alert Behavior During Therapy: WFL for tasks assessed/performed Overall Cognitive Status: Within Functional Limits for tasks  assessed                                          General Comments      Exercises     Assessment/Plan    PT Assessment Patient needs continued PT services  PT Problem List Decreased strength;Decreased mobility;Decreased activity tolerance;Decreased balance;Decreased knowledge of use of DME       PT Treatment Interventions DME instruction;Therapeutic exercise;Gait training;Balance training;Stair training;Neuromuscular re-education;Functional mobility training;Therapeutic activities;Patient/family education    PT Goals (Current goals can be found in the Care Plan section)  Acute Rehab PT Goals Patient Stated Goal: return home PT Goal Formulation: With patient Time For Goal Achievement: 08/02/22 Potential to Achieve Goals: Good    Frequency Min 3X/week     Co-evaluation               AM-PAC PT "6 Clicks" Mobility  Outcome Measure Help needed turning from your back to your side while in a flat bed without using bedrails?: None Help needed moving from lying on your back to sitting on the side of a flat bed without using bedrails?: A Little Help needed moving to and from a bed to a chair (including  a wheelchair)?: A Little Help needed standing up from a chair using your arms (e.g., wheelchair or bedside chair)?: A Little Help needed to walk in hospital room?: A Lot Help needed climbing 3-5 steps with a railing? : A Lot 6 Click Score: 17    End of Session Equipment Utilized During Treatment: Gait belt Activity Tolerance: Patient tolerated treatment well;Patient limited by fatigue Patient left: in bed;with nursing/sitter in room;with call bell/phone within reach Nurse Communication: Mobility status PT Visit Diagnosis: Unsteadiness on feet (R26.81);Other abnormalities of gait and mobility (R26.89);Muscle weakness (generalized) (M62.81);Other symptoms and signs involving the nervous system (R29.898)    Time: 1216-1232 PT Time Calculation (min) (ACUTE  ONLY): 16 min   Charges:   PT Evaluation $PT Eval Low Complexity: 1 Low PT Treatments $Therapeutic Activity: 8-22 mins        12:47 PM, 07/19/22 Mearl Latin PT, DPT Physical Therapist at [facility]

## 2022-07-19 NOTE — Assessment & Plan Note (Addendum)
-  Potassium 2.7 at time of admission -Electrolytes have been repleted and within normal limits currently. -Continue to follow trend/stability

## 2022-07-19 NOTE — Plan of Care (Signed)
  Problem: Acute Rehab PT Goals(only PT should resolve) Goal: Patient Will Transfer Sit To/From Stand Outcome: Progressing Flowsheets (Taken 07/19/2022 1248) Patient will transfer sit to/from stand:  with min guard assist  with supervision Goal: Pt Will Transfer Bed To Chair/Chair To Bed Outcome: Progressing Flowsheets (Taken 07/19/2022 1248) Pt will Transfer Bed to Chair/Chair to Bed:  with supervision  min guard assist Goal: Pt Will Ambulate Outcome: Progressing Flowsheets (Taken 07/19/2022 1248) Pt will Ambulate:  50 feet  with supervision  with least restrictive assistive device Goal: Pt/caregiver will Perform Home Exercise Program Outcome: Progressing Flowsheets (Taken 07/19/2022 1248) Pt/caregiver will Perform Home Exercise Program:  For increased strengthening  For improved balance  Independently  12:48 PM, 07/19/22 Wyman Songster PT, DPT Physical Therapist at [facility]

## 2022-07-20 ENCOUNTER — Observation Stay (HOSPITAL_COMMUNITY): Payer: Medicare Other

## 2022-07-20 DIAGNOSIS — R29701 NIHSS score 1: Secondary | ICD-10-CM | POA: Diagnosis present

## 2022-07-20 DIAGNOSIS — K219 Gastro-esophageal reflux disease without esophagitis: Secondary | ICD-10-CM | POA: Diagnosis present

## 2022-07-20 DIAGNOSIS — E785 Hyperlipidemia, unspecified: Secondary | ICD-10-CM | POA: Diagnosis not present

## 2022-07-20 DIAGNOSIS — I10 Essential (primary) hypertension: Secondary | ICD-10-CM | POA: Diagnosis present

## 2022-07-20 DIAGNOSIS — I6329 Cerebral infarction due to unspecified occlusion or stenosis of other precerebral arteries: Secondary | ICD-10-CM | POA: Diagnosis present

## 2022-07-20 DIAGNOSIS — Z111 Encounter for screening for respiratory tuberculosis: Secondary | ICD-10-CM | POA: Diagnosis not present

## 2022-07-20 DIAGNOSIS — H9190 Unspecified hearing loss, unspecified ear: Secondary | ICD-10-CM | POA: Diagnosis present

## 2022-07-20 DIAGNOSIS — E876 Hypokalemia: Secondary | ICD-10-CM | POA: Diagnosis present

## 2022-07-20 DIAGNOSIS — Z823 Family history of stroke: Secondary | ICD-10-CM | POA: Diagnosis not present

## 2022-07-20 DIAGNOSIS — Z791 Long term (current) use of non-steroidal anti-inflammatories (NSAID): Secondary | ICD-10-CM | POA: Diagnosis not present

## 2022-07-20 DIAGNOSIS — Z79899 Other long term (current) drug therapy: Secondary | ICD-10-CM | POA: Diagnosis not present

## 2022-07-20 DIAGNOSIS — I639 Cerebral infarction, unspecified: Secondary | ICD-10-CM | POA: Diagnosis not present

## 2022-07-20 DIAGNOSIS — H532 Diplopia: Secondary | ICD-10-CM | POA: Diagnosis present

## 2022-07-20 DIAGNOSIS — Z8249 Family history of ischemic heart disease and other diseases of the circulatory system: Secondary | ICD-10-CM | POA: Diagnosis not present

## 2022-07-20 DIAGNOSIS — Z66 Do not resuscitate: Secondary | ICD-10-CM | POA: Diagnosis present

## 2022-07-20 DIAGNOSIS — M19011 Primary osteoarthritis, right shoulder: Secondary | ICD-10-CM | POA: Diagnosis not present

## 2022-07-20 DIAGNOSIS — H538 Other visual disturbances: Secondary | ICD-10-CM | POA: Diagnosis not present

## 2022-07-20 DIAGNOSIS — M19012 Primary osteoarthritis, left shoulder: Secondary | ICD-10-CM | POA: Diagnosis not present

## 2022-07-20 DIAGNOSIS — Z20822 Contact with and (suspected) exposure to covid-19: Secondary | ICD-10-CM | POA: Diagnosis present

## 2022-07-20 DIAGNOSIS — G319 Degenerative disease of nervous system, unspecified: Secondary | ICD-10-CM | POA: Diagnosis not present

## 2022-07-20 DIAGNOSIS — Z96643 Presence of artificial hip joint, bilateral: Secondary | ICD-10-CM | POA: Diagnosis present

## 2022-07-20 DIAGNOSIS — Z882 Allergy status to sulfonamides status: Secondary | ICD-10-CM | POA: Diagnosis not present

## 2022-07-20 DIAGNOSIS — R2990 Unspecified symptoms and signs involving the nervous system: Secondary | ICD-10-CM | POA: Diagnosis not present

## 2022-07-20 DIAGNOSIS — R42 Dizziness and giddiness: Secondary | ICD-10-CM | POA: Diagnosis not present

## 2022-07-20 DIAGNOSIS — E538 Deficiency of other specified B group vitamins: Secondary | ICD-10-CM | POA: Diagnosis present

## 2022-07-20 DIAGNOSIS — Z87891 Personal history of nicotine dependence: Secondary | ICD-10-CM | POA: Diagnosis not present

## 2022-07-20 DIAGNOSIS — E7800 Pure hypercholesterolemia, unspecified: Secondary | ICD-10-CM | POA: Diagnosis present

## 2022-07-20 DIAGNOSIS — Z885 Allergy status to narcotic agent status: Secondary | ICD-10-CM | POA: Diagnosis not present

## 2022-07-20 LAB — BASIC METABOLIC PANEL
Anion gap: 7 (ref 5–15)
BUN: 17 mg/dL (ref 8–23)
CO2: 22 mmol/L (ref 22–32)
Calcium: 9.5 mg/dL (ref 8.9–10.3)
Chloride: 112 mmol/L — ABNORMAL HIGH (ref 98–111)
Creatinine, Ser: 1.2 mg/dL — ABNORMAL HIGH (ref 0.44–1.00)
GFR, Estimated: 42 mL/min — ABNORMAL LOW (ref >60)
Glucose, Bld: 96 mg/dL (ref 70–99)
Potassium: 3.8 mmol/L (ref 3.5–5.1)
Sodium: 141 mmol/L (ref 135–145)

## 2022-07-20 LAB — CBC
HCT: 37.3 % (ref 36.0–46.0)
Hemoglobin: 12.3 g/dL (ref 12.0–15.0)
MCH: 31.5 pg (ref 26.0–34.0)
MCHC: 33 g/dL (ref 30.0–36.0)
MCV: 95.4 fL (ref 80.0–100.0)
Platelets: 130 10*3/uL — ABNORMAL LOW (ref 150–400)
RBC: 3.91 MIL/uL (ref 3.87–5.11)
RDW: 13.8 % (ref 11.5–15.5)
WBC: 8.3 10*3/uL (ref 4.0–10.5)
nRBC: 0 % (ref 0.0–0.2)

## 2022-07-20 MED ORDER — CYANOCOBALAMIN 1000 MCG/ML IJ SOLN
1000.0000 ug | Freq: Every day | INTRAMUSCULAR | Status: DC
Start: 2022-07-20 — End: 2022-07-22
  Administered 2022-07-20 – 2022-07-22 (×3): 1000 ug via INTRAMUSCULAR
  Filled 2022-07-20 (×3): qty 1

## 2022-07-20 MED ORDER — GABAPENTIN 100 MG PO CAPS
100.0000 mg | ORAL_CAPSULE | Freq: Two times a day (BID) | ORAL | Status: DC
  Administered 2022-07-20 – 2022-07-21 (×2): 100 mg via ORAL
  Filled 2022-07-20 (×2): qty 1

## 2022-07-20 MED ORDER — BUMETANIDE 1 MG PO TABS
1.0000 mg | ORAL_TABLET | Freq: Every day | ORAL | Status: DC
  Administered 2022-07-21 – 2022-07-22 (×2): 1 mg via ORAL
  Filled 2022-07-20 (×3): qty 1

## 2022-07-20 NOTE — Progress Notes (Signed)
Progress Note   Patient: Carrie Sanders INO:[id number] DOB: 1927/01/06 DOA: 07/18/2022     0 DOS: the patient was seen and examined on 07/20/2022   Brief hospital admission course: As per H&P written by Dr.Zierle-Ghosh on 07/18/22 Carrie Sanders is a 88 y.o. female with medical history significant of arthritis, hyperlipidemia, hypertension, and more presents to ED with a chief complaint of blurry vision.  Patient reports that she was in her normal state of health, when around 4 PM on August 19 she suddenly became very dizzy.  She reports that she lost her balance and almost fell.  Likely she was in the bathroom where she could just sit on the commode.  She reports that every time she looks left she became very dizzy and had double vision or blurred vision.  Patient reports no headache with this.  She had no difficulty speaking, no difficulty swallowing.  She did not have any asymmetric weakness, or numbness.  She denies numbness on the face.  Patient reports that she has never had a stroke.  She does have a strong family history of aneurysms of the brain per her report.  Code stroke was called and she scored a 1 on NIHSS for gaze.  Neurology recommended stroke work-up at [facility].  Neurochecks every 30 minutes until patient is out of TNK window which was 9:30 PM August 19 per neuro.  Permissive hypertension for 48 hours.  Of the brain.  MRA head and neck.  Echo.  Aspirin 81 mg daily and Plavix 75 mg daily for 21 days.   Patient does not smoke, does not drink.  She is vaccinated for COVID.  Patient is DNR.  Assessment and Plan: * Stroke [facility]) -CT head shows remote infarcts, but no acute intracranial abnormality or bleeding. -Case discussed with neurology service who feel patient may have a small brainstem stroke, but safe to state at [facility] and complete stroke work-up. -Carotid Dopplers demonstrated moderate heterogenous and calcified plaque, with no hemodynamically significant stenosis by Doppler  criteria in the extracranial cerebrovascular circulation. -2D echo with preserved ejection fraction and no significant valvular disorder.  No thrombi appreciated. -MRI demonstrating small acute/subacute infarcts in the cerebellar region and pons area. -There was no signs of large vessel occlusion. -Continue aspirin and Plavix for secondary prevention (for 21 days) and then baby aspirin daily. -Continue statin and risk factor modifications.. -Permissive hypertension was allowed for 48 hours; will start resuming antihypertensive agents at this point.    Stroke-like symptoms -With concern for brainstem stroke -Continue aspirin and Plavix for secondary prevention; with subsequent plan after 21 days to pursued the use of only baby aspirin for secondary prevention. -Outpatient follow-up with neurology service.  Hypokalemia -Potassium 2.7 at time of admission -Electrolytes have been repleted and within normal limits currently. -Continue to follow trend/stability   GERD (gastroesophageal reflux disease) -Continue protonix   HLD (hyperlipidemia) -Continue crestor -Lipid panel demonstrating LDL 79, HDL 97, triglycerides 70 and total cholesterol 190.  HTN (hypertension) -permissive HTN for 24-48 hours allowed -will start resuming antihypertensive treatment. -Heart healthy diet ordered. -Follow vital signs.  B12 deficiency -B12 level 123 -Repletion will be started.  Subjective:  No fever, no chest pain, no palpitations, no nausea, no vomiting, no shortness of breath.  Reports neurologic symptoms significantly improved/almost resolved.  Just having mild off-balance sensation and reporting weakness.  No visual disturbances currently.  Physical Exam: Vitals:   07/19/22 2014 07/20/22 0002 07/20/22 0339 07/20/22 1336  BP: (!) 153/100 Marland Kitchen)  176/94 (!) 158/97 137/72  Pulse: 80 94 96 89  Resp: 18 14 16 17   Temp: (!) 97.4 F (36.3 C) (!) 97.4 F (36.3 C) (!) 97 F (36.1 C) 97.7 F (36.5  C)  TempSrc:  Oral    SpO2: 92% 100% 95% 97%  Weight:      Height:       General exam: Alert, awake, oriented x 3; no pronator drift, no chest pain, no nausea, no vomiting, no shortness of breath. Respiratory system: Clear to auscultation. Respiratory effort normal.  Good saturation on room air.  No using accessory muscles. Cardiovascular system:RRR. No rubs, gallops or JVD. Gastrointestinal system: Abdomen is nondistended, soft and nontender. No organomegaly or masses felt. Normal bowel sounds heard. Central nervous system: Reporting off-balance sensations while walking; but most of her neurology symptoms and presentations are resolved by now.  Normal muscle strength for age and effort appreciated on exam. Extremities: No cyanosis, clubbing or edema. Skin: No petechiae. Psychiatry: Judgement and insight appear normal. Mood & affect appropriate.   Data Reviewed: CBC: WBCs 8.3, hemoglobin 12.3, platelet count 130 K Basic metabolic panel: Sodium 141, potassium 3.8, bicarb 22, BUN 17, creatinine 1.2 Lipid panel: Total cholesterol 190, triglycerides 70, HDL 97, LDL 79 A1c 5.3  Family Communication: no family at bedside.  Disposition: Status is: Observation The patient remains OBS appropriate and will d/c before 2 midnights.   Planned Discharge Destination: to be determined; but most likely discharge to ALF with [facility] services.   Author: [facility], MD 07/20/2022 4:35 PM  For on call review [email or web address].

## 2022-07-20 NOTE — Plan of Care (Addendum)
Stroke workup summary  Lab Results  Component Value Date   HGBA1C 5.3 07/18/2022     Lab Results  Component Value Date   CHOL 190 07/19/2022   HDL 97 07/19/2022   LDLCALC 79 07/19/2022   TRIG 70 07/19/2022   CHOLHDL 2.0 07/19/2022   ECHO  1. Left ventricular ejection fraction, by estimation, is 60 to 65%. The  left ventricle has normal function. The left ventricle has no regional  wall motion abnormalities. There is severe asymmetric left ventricular  hypertrophy of the basal-septal  segment. Left ventricular diastolic parameters are consistent with Grade  II diastolic dysfunction (pseudonormalization). Elevated left atrial  pressure.   2. Right ventricular systolic function is normal. The right ventricular  size is normal. There is normal pulmonary artery systolic pressure. The  estimated right ventricular systolic pressure is 23.1 mmHg.   3. A small pericardial effusion is present.   4. The mitral valve is degenerative. Trivial mitral valve regurgitation.  Moderate mitral annular calcification.   5. The aortic valve was not well visualized. Aortic valve regurgitation  is not visualized. Aortic valve sclerosis/calcification is present,  without any evidence of aortic stenosis.   6. The inferior vena cava is normal in size with greater than 50%  respiratory variability, suggesting right atrial pressure of 3 mmHg.  [ Normal biatrial sizes]  MRI brain and MRA head personally reviewed, agree with radiology:   1. Suspected punctate acute or subacute infarct in the dorsal pons. 2. Small subacute left cerebellar infarcts. 3. Mild chronic small vessel ischemic disease. 4. Negative head MRA.  Cartoid [place] Color duplex indicates moderate heterogeneous and calcified plaque, with no hemodynamically significant stenosis by duplex criteria in the extracranial cerebrovascular circulation.  Current vital signs: BP 137/72 (BP Location: Left Arm)   Pulse 89   Temp 97.7 F (36.5 C)    Resp 17   Ht 5\' 1"  (1.549 m)   Wt 54.4 kg   SpO2 97%   BMI 22.66 kg/m  Vital signs in last 24 hours: Temp:  [97 F (36.1 C)-97.7 F (36.5 C)] 97.7 F (36.5 C) (08/21 1336) Pulse Rate:  [80-96] 89 (08/21 1336) Resp:  [14-18] 17 (08/21 1336) BP: (137-176)/(72-100) 137/72 (08/21 1336) SpO2:  [92 %-100 %] 97 % (08/21 1336)   Assessment: Per chart review patient has remained neurologically stable with isolated internuclear ophthalmoplegia (INO).  However her MRI has revealed a subacute cerebellar infarct in addition to a punctate dorsal pons infarct (the latter of which explains her INO)  Recommendations: -Patient is out of the permissive hypertension window and blood pressure should now be gradually normalized with a goal of normotension -Continue dual antiplatelet therapy for 21 days followed by aspirin monotherapy lifelong, unless indication for anticoagulation is found and anticoagulation is initiated.  In which case aspirin monotherapy should only be continued if indicated from a cardiac perspective (such as coronary artery disease) -Given her LDL is not at goal, recommend increasing rosuvastatin to 40 mg daily -Given embolic appearance of her subacute cerebellar stroke, recommend 14 to 30-day cardiac monitoring, such as Zio patch, specifically to assess for potential occult atrial fibrillation.  Loop recorder could also be considered if available at [facility] or placed on an outpatient basis (referral to cardiology recommended) -Recommendations routed to Dr. [facility] via Epic, Neurology will be available as needed please reach out to Dr. Gwenlyn Perking with any questions or concerns  These recommendations were formulated via chart review given bed availability at [facility]  did not allow for patient transfer over the weekend  No charge note  Brooke Dare MD-PhD [facility] [phone] Available 7 PM to 7 AM, outside of these hours please call Neurologist on call as  listed on Amion.

## 2022-07-20 NOTE — [place] FL2 (Signed)
[place] MEDICAID FL2 LEVEL OF CARE SCREENING TOOL     IDENTIFICATION  Patient Name: Carrie Sanders Birthdate: Sep 18, 1927 Sex: female Admission Date (Current Location): 07/18/2022  [facility] and [place] Number:  [employer] and Address:  [facility],  [address], [place] [address]      Provider Number: [id number]  Attending Physician Name and Address:  Vassie Loll, MD  Relative Name and Phone Number:  Cheyenna, Pankowski (Daughter) [phone]    Current Level of Care: Hospital Recommended Level of Care: Assisted Living Facility Prior Approval Number:    Date Approved/Denied:   PASRR Number:    Discharge Plan: Other (Comment) (Assisted Living Facility)    Current Diagnoses: Patient Active Problem List   Diagnosis Date Noted   Hypokalemia 07/19/2022   Stroke-like symptoms 07/19/2022   Stroke (HCC) 07/18/2022   HTN (hypertension) 07/18/2022   HLD (hyperlipidemia) 07/18/2022   GERD (gastroesophageal reflux disease) 07/18/2022   Spinal stenosis at L4-L5 level 07/24/2016   Paresthesia 05/26/2016   Gait disorder 05/26/2016   S/P Left THA, AA 03/17/2012   KNEE, ARTHRITIS, DEGEN./OSTEO 05/23/2009   KNEE PAIN 05/23/2009   ANSERINE BURSITIS, LEFT 05/23/2009   TRIGGER FINGER 05/23/2009   HAND PAIN 05/23/2009   HIGH BLOOD PRESSURE 05/23/2009    Orientation RESPIRATION BLADDER Height & Weight     Self, Time, Situation, Place  Normal Incontinent Weight: 119 lb 14.9 oz (54.4 kg) Height:  5\' 1"  (154.9 cm)  BEHAVIORAL SYMPTOMS/MOOD NEUROLOGICAL BOWEL NUTRITION STATUS      Continent Diet (Heart Healthy)  AMBULATORY STATUS COMMUNICATION OF NEEDS Skin   Limited Assist Verbally Normal                       Personal Care Assistance Level of Assistance  Bathing, Feeding, Dressing Bathing Assistance: Limited assistance Feeding assistance: Independent Dressing Assistance: Limited assistance     Functional Limitations Info  Sight, Hearing,  Speech Sight Info: Adequate Hearing Info: Impaired Speech Info: Adequate    SPECIAL CARE FACTORS FREQUENCY  PT (By licensed PT), OT (By licensed OT)     PT Frequency: Home Health OT Frequency: Home Health            Contractures Contractures Info: Not present    Additional Factors Info  Code Status, Allergies Code Status Info: DNR Allergies Info: Codeine and Sulfonamide Derivatives           Current Medications (07/20/2022):  This is the current hospital active medication list Current Facility-Administered Medications  Medication Dose Route Frequency Provider Last Rate Last Admin   acetaminophen (TYLENOL) tablet 650 mg  650 mg Oral Q4H PRN Zierle-Ghosh, Asia B, DO       Or   acetaminophen (TYLENOL) 160 MG/5ML solution 650 mg  650 mg Per Tube Q4H PRN Zierle-Ghosh, Asia B, DO       Or   acetaminophen (TYLENOL) suppository 650 mg  650 mg Rectal Q4H PRN Zierle-Ghosh, Asia B, DO       aspirin chewable tablet 81 mg  81 mg Oral Daily Zierle-Ghosh, Asia B, DO   81 mg at 07/20/22 1009   clopidogrel (PLAVIX) tablet 75 mg  75 mg Oral Daily 07/22/22, MD   75 mg at 07/20/22 1009   heparin injection 5,000 Units  5,000 Units Subcutaneous Q8H Zierle-Ghosh, Asia B, DO   5,000 Units at 07/20/22 0631   labetalol (NORMODYNE) injection 20 mg  20 mg Intravenous Q6H PRN 07/22/22, MD  20 mg at 07/19/22 1233   losartan (COZAAR) tablet 50 mg  50 mg Oral q morning Zierle-Ghosh, Asia B, DO   50 mg at 07/20/22 1009   pantoprazole (PROTONIX) EC tablet 40 mg  40 mg Oral Daily Zierle-Ghosh, Asia B, DO   40 mg at 07/20/22 1009   rosuvastatin (CRESTOR) tablet 10 mg  10 mg Oral q1800 Zierle-Ghosh, Asia B, DO   10 mg at 07/19/22 1736   senna-docusate (Senokot-S) tablet 1 tablet  1 tablet Oral QHS PRN Zierle-Ghosh, Asia B, DO       traMADol (ULTRAM) tablet 50 mg  50 mg Oral Q12H PRN Zierle-Ghosh, Asia B, DO   50 mg at 07/20/22 0002     Discharge Medications: Please see discharge summary for  a list of discharge medications.  Relevant Imaging Results:  Relevant Lab Results:   Additional Information SSN: [id number] [place]

## 2022-07-20 NOTE — TOC Initial Note (Addendum)
Transition of Care [facility]) - Initial/Assessment Note    Patient Details  Name: Carrie Sanders MRN: [id number] Date of Birth: March 01, 1927  Transition of Care [facility]) CM/SW Contact:    Iona Beard, [place] Phone Number: 07/20/2022, 2:06 PM  Clinical Narrative:                 CSW met with pt and daughters in room to explain PT recommendation for SNF, pt is alert and oriented. Pts daughter Levada Dy stated that they have appointment for the [facility]. CSW explained that this is an ALF and we do not place in ALF from the hospital. CSW explained and provided SNF list to pts daughter.   CSW updated by RN that Levada Dy was able to get pt bed at the [facility]. They are requesting progress notes, fl2 and TB test. MD has ordered chest Xray for TB. [facility] states they cannot come to hospital and assess until tomorrow. CSW awaiting response from [facility] as to if assessment can be pushed to today as pt is medically ready for D/C. TOC to follow.   Addendum 3:00pm: CSW updated by Burna Mortimer with [facility] that they will send someone to assess pt tomorrow morning as they have no staff able to complete it today. [facility] states chest xray for TB is okay. CSW sent additional lab work to the [facility] at their request. Oncoming TOC will follow up with [facility] in the morning.   Expected Discharge Plan: Assisted Living Barriers to Discharge: Continued Medical Work up   Patient Goals and CMS Choice Patient states their goals for this hospitalization and ongoing recovery are:: go to ALF CMS Medicare.gov Compare Post Acute Care list provided to:: Patient Choice offered to / list presented to : Patient, [facility] POA / Guardian, Adult Children  Expected Discharge Plan and Services Expected Discharge Plan: Assisted Living In-house Referral: Clinical Social Work Discharge Planning Services: CM Consult Post Acute Care Choice: [place] arrangements for the past 2 months: Single Family Home                                       Prior Living Arrangements/Services Living arrangements for the past 2 months: Single Family Home Lives with:: Self Patient language and need for interpreter reviewed:: Yes Do you feel safe going back to the place where you live?: Yes      Need for Family Participation in Patient Care: Yes (Comment) Care giver support system in place?: Yes (comment)   Criminal Activity/Legal Involvement Pertinent to Current Situation/Hospitalization: No - Comment as needed  Activities of Daily Living Home Assistive Devices/Equipment: Environmental consultant (specify type), Shower chair with back ADL Screening (condition at time of admission) Patient's cognitive ability adequate to safely complete daily activities?: Yes Is the patient deaf or have difficulty hearing?: Yes Does the patient have difficulty seeing, even when wearing glasses/contacts?: No Does the patient have difficulty concentrating, remembering, or making decisions?: No Patient able to express need for assistance with ADLs?: Yes Does the patient have difficulty dressing or bathing?: No Independently performs ADLs?: Yes (appropriate for developmental age) Does the patient have difficulty walking or climbing stairs?: Yes Weakness of Legs: None Weakness of Arms/Hands: None  Permission Sought/Granted                  Emotional Assessment Appearance:: Appears stated age Attitude/Demeanor/Rapport: Engaged Affect (typically observed): Accepting Orientation: : Oriented to  Self, Oriented to Place, Oriented to  Time, Oriented to Situation Alcohol / Substance Use: Not Applicable Psych Involvement: No (comment)  Admission diagnosis:  Stroke [facility]) [I63.9] Stroke-like symptoms [R29.90] Patient Active Problem List   Diagnosis Date Noted   Hypokalemia 07/19/2022   Stroke-like symptoms 07/19/2022   Stroke ([place]) 07/18/2022   HTN (hypertension) 07/18/2022   HLD (hyperlipidemia) 07/18/2022   GERD (gastroesophageal reflux  disease) 07/18/2022   Spinal stenosis at L4-L5 level 07/24/2016   Paresthesia 05/26/2016   Gait disorder 05/26/2016   S/P Left THA, AA 03/17/2012   KNEE, ARTHRITIS, DEGEN./OSTEO 05/23/2009   KNEE PAIN 05/23/2009   ANSERINE BURSITIS, LEFT 05/23/2009   TRIGGER FINGER 05/23/2009   HAND PAIN 05/23/2009   HIGH BLOOD PRESSURE 05/23/2009   PCP:  Celene Squibb, MD Pharmacy:   [facility] [place], [place] [facility] [place] [address] [place] [address] Phone: [phone] Fax: [place], [place] [address] [place] [address] Phone: [phone] Fax: [phone]     Social Determinants of Health (SDOH) Interventions    Readmission Risk Interventions     No data to display

## 2022-07-20 NOTE — Progress Notes (Signed)
SLP Cancellation Note  Patient Details Name: Carrie Sanders MRN: [id number] DOB: 07/11/1927   Cancelled treatment:       Reason Eval/Treat Not Completed: SLP screened, no needs identified, will sign off; SLP screened Pt in room. Pt denies any changes in swallowing, speech, language, or cognition. MRI shows Suspected punctate acute or subacute infarct in the dorsal pons. Small subacute left cerebellar infarcts. Pt able to express herself independently and will have the necessary help she needs at discharge ([facility]). SLE will be deferred at this time. Reconsult if indicated. SLP will sign off.   Thank you,  Havery Moros, CCC-SLP [phone]  [name] 07/20/2022, 3:58 PM

## 2022-07-20 NOTE — Evaluation (Signed)
Occupational Therapy Evaluation Patient Details Name: Carrie Sanders MRN: [id number] DOB: Apr 12, 1927 Today's Date: 07/20/2022   History of Present Illness Carrie Sanders is a 88 y.o. female with medical history significant of arthritis, hyperlipidemia, hypertension, and more presents to ED with a chief complaint of blurry vision.  Patient reports that she was in her normal state of health, when around 4 PM on August 19 she suddenly became very dizzy.  She reports that she lost her balance and almost fell.  Likely she was in the bathroom where she could just sit on the commode.  She reports that every time she looks left she became very dizzy and had double vision or blurred vision. MRI positive for suspected acute infarct, subacute left infarcts.   Clinical Impression   Pt agreeable to OT evaluation this am, daughter present and assisting with PLOF. Pt reports all visual disturbances have resolved with exception of requiring increased time to focus when she looks at something or turns her head. Pt is at her baseline with ADLs. Discussed AE available to assist with improved functioning for grasping and holding items. No further OT services required at this time.       Recommendations for follow up therapy are one component of a multi-disciplinary discharge planning process, led by the attending physician.  Recommendations may be updated based on patient status, additional functional criteria and insurance authorization.   Follow Up Recommendations  No OT follow up    Assistance Recommended at Discharge Intermittent Supervision/Assistance  Patient can return home with the following A little help with walking and/or transfers;A little help with bathing/dressing/bathroom;Help with stairs or ramp for entrance    Functional Status Assessment  Patient has had a recent decline in their functional status and demonstrates the ability to make significant improvements in function in a reasonable and  predictable amount of time.  Equipment Recommendations  None recommended by OT    Recommendations for Other Services       Precautions / Restrictions Precautions Precautions: Fall Restrictions Weight Bearing Restrictions: No      Mobility Bed Mobility Overal bed mobility: Needs Assistance Bed Mobility: Supine to Sit, Sit to Supine     Supine to sit: Supervision, HOB elevated Sit to supine: Supervision, HOB elevated        Transfers                   General transfer comment: not completed      Balance                                           ADL either performed or assessed with clinical judgement   ADL Overall ADL's : Modified independent;At baseline                                       General ADL Comments: Discussed adaptive equipment for improved grip during ADLs including built up utensils and silicone cup handle     Vision Baseline Vision/History: 1 Wears glasses Ability to See in Adequate Light: 0 Adequate Patient Visual Report: Other (comment) (difficulty focusing initially, otherwise back to baseline) Vision Assessment?: Yes Eye Alignment: Within Functional Limits Ocular Range of Motion: Within Functional Limits Alignment/Gaze Preference: Within Defined Limits Tracking/Visual Pursuits: Able to track stimulus in all  quads without difficulty Saccades: Within functional limits Convergence: Within functional limits Visual Fields: No apparent deficits            Pertinent Vitals/Pain Pain Assessment Pain Assessment: No/denies pain     Hand Dominance Right   Extremity/Trunk Assessment Upper Extremity Assessment Upper Extremity Assessment: Generalized weakness   Lower Extremity Assessment Lower Extremity Assessment: Defer to PT evaluation   Cervical / Trunk Assessment Cervical / Trunk Assessment: Kyphotic   Communication Communication Communication: No difficulties   Cognition  Arousal/Alertness: Awake/alert Behavior During Therapy: WFL for tasks assessed/performed Overall Cognitive Status: Within Functional Limits for tasks assessed                                                  Home Living Family/patient expects to be discharged to:: Private residence Living Arrangements: Alone Available Help at Discharge: Personal care attendant;Family (PCA 3x/week) Type of Home: House Home Access: Stairs to enter [employer] of Steps: 2 Entrance Stairs-Rails: Left Home Layout: One level     Bathroom Shower/Tub: Chief Strategy Officer: Standard     Home Equipment: Agricultural consultant (2 wheels);Rollator (4 wheels);Shower seat;Grab bars - tub/shower          Prior Functioning/Environment Prior Level of Function : Independent/Modified Independent             Mobility Comments: Patient states household ambulation with rollator, uses RW outside ADLs Comments: independent        OT Problem List: Decreased activity tolerance;Impaired balance (sitting and/or standing);Impaired vision/perception       AM-PAC OT "6 Clicks" Daily Activity     Outcome Measure Help from another person eating meals?: None Help from another person taking care of personal grooming?: None Help from another person toileting, which includes using toliet, bedpan, or urinal?: None Help from another person bathing (including washing, rinsing, drying)?: A Little Help from another person to put on and taking off regular upper body clothing?: A Little Help from another person to put on and taking off regular lower body clothing?: A Little 6 Click Score: 21   End of Session    Activity Tolerance: Patient tolerated treatment well Patient left: in bed;with call bell/phone within reach;with family/visitor present  OT Visit Diagnosis: Other (comment) (vision deficit)                Time: 8841-6606 OT Time Calculation (min): 15 min Charges:  OT  General Charges $OT Visit: 1 Visit OT Evaluation $OT Eval Low Complexity: 1 Low    Ezra Sites, OTR/L  [phone] 07/20/2022, 11:13 AM

## 2022-07-21 DIAGNOSIS — I10 Essential (primary) hypertension: Secondary | ICD-10-CM | POA: Diagnosis not present

## 2022-07-21 DIAGNOSIS — K219 Gastro-esophageal reflux disease without esophagitis: Secondary | ICD-10-CM | POA: Diagnosis not present

## 2022-07-21 DIAGNOSIS — E876 Hypokalemia: Secondary | ICD-10-CM | POA: Diagnosis not present

## 2022-07-21 DIAGNOSIS — E785 Hyperlipidemia, unspecified: Secondary | ICD-10-CM | POA: Diagnosis not present

## 2022-07-21 NOTE — Plan of Care (Signed)
Pt alert and oriented x 4. HOH. Med compliant. Received one prn tramadol for pain in legs. SCD in placed Heparin every 8 hours for VTE. NIH 0. Vitals stable.  Problem: Education: Goal: Knowledge of General Education information will improve Description: Including pain rating scale, medication(s)/side effects and non-pharmacologic comfort measures Outcome: Progressing   Problem: Health Behavior/Discharge Planning: Goal: Ability to manage health-related needs will improve Outcome: Progressing   Problem: Clinical Measurements: Goal: Ability to maintain clinical measurements within normal limits will improve Outcome: Progressing Goal: Will remain free from infection Outcome: Progressing Goal: Diagnostic test results will improve Outcome: Progressing Goal: Respiratory complications will improve Outcome: Progressing Goal: Cardiovascular complication will be avoided Outcome: Progressing   Problem: Activity: Goal: Risk for activity intolerance will decrease Outcome: Progressing   Problem: Nutrition: Goal: Adequate nutrition will be maintained Outcome: Progressing   Problem: Coping: Goal: Level of anxiety will decrease Outcome: Progressing   Problem: Elimination: Goal: Will not experience complications related to bowel motility Outcome: Progressing Goal: Will not experience complications related to urinary retention Outcome: Progressing   Problem: Pain Managment: Goal: General experience of comfort will improve Outcome: Progressing   Problem: Safety: Goal: Ability to remain free from injury will improve Outcome: Progressing   Problem: Skin Integrity: Goal: Risk for impaired skin integrity will decrease Outcome: Progressing

## 2022-07-21 NOTE — Progress Notes (Signed)
Progress Note   Patient: Carrie Sanders:[id number] DOB: [birth year] DOA: 07/18/2022     1 DOS: the patient was seen and examined on 07/21/2022   Brief hospital admission course: As per H&P written by Dr.Zierle-Ghosh on 07/18/22 Carrie Sanders is a 88 y.o. female with medical history significant of arthritis, hyperlipidemia, hypertension, and more presents to ED with a chief complaint of blurry vision.  Patient reports that she was in her normal state of health, when around 4 PM on August 19 she suddenly became very dizzy.  She reports that she lost her balance and almost fell.  Likely she was in the bathroom where she could just sit on the commode.  She reports that every time she looks left she became very dizzy and had double vision or blurred vision.  Patient reports no headache with this.  She had no difficulty speaking, no difficulty swallowing.  She did not have any asymmetric weakness, or numbness.  She denies numbness on the face.  Patient reports that she has never had a stroke.  She does have a strong family history of aneurysms of the brain per her report.  Code stroke was called and she scored a 1 on NIHSS for gaze.  Neurology recommended stroke work[facility]up at [facility].  Neurochecks every 30 minutes until patient is out of TNK window which was 9:30 PM August 19 per neuro.  Permissive hypertension for 48 hours.  Of the brain.  MRA head and neck.  Echo.  Aspirin 81 mg daily and Plavix 75 mg daily for 21 days.   Patient does not smoke, does not drink.  She is vaccinated for COVID.  Patient is DNR.  Assessment and Plan: * Stroke [facility]) -CT head shows remote infarcts, but no acute intracranial abnormality or bleeding. -Case discussed with neurology service who feel patient may have a small brainstem stroke, but safe to state at [facility] and complete stroke work-up. -Carotid Dopplers demonstrated moderate heterogenous and calcified plaque, with no hemodynamically significant stenosis by Doppler  criteria in the extracranial cerebrovascular circulation. -2D echo with preserved ejection fraction and no significant valvular disorder.  No thrombi appreciated. -MRI demonstrating small acute/subacute infarcts in the cerebellar region and pons area. -There was no signs of large vessel occlusion. -Continue aspirin and Plavix for secondary prevention (for 21 days) and then baby aspirin daily. -Continue statin and risk factor modifications.. -Permissive hypertension was allowed for 48 hours; will start resuming antihypertensive agents at this point.    Stroke-like symptoms -With MRI demonstrating bilateral ischemic cerebellar and  pons infarcts. -Continue aspirin and Plavix for secondary prevention; with subsequent plan after 21 days to pursued the use of only baby aspirin for secondary prevention. -Outpatient follow-up with neurology service.  Hypokalemia -Potassium 2.7 at time of admission -Electrolytes have been repleted and within normal limits currently. -Continue to follow trend/stability   GERD (gastroesophageal reflux disease) -Continue protonix   HLD (hyperlipidemia) -Continue crestor -Lipid panel demonstrating LDL 79, HDL 97, triglycerides 70 and total cholesterol 190.  HTN (hypertension) -permissive HTN for 24-48 hours allowed -will start resuming antihypertensive treatment. -Heart healthy diet ordered. -Follow vital signs.  B12 deficiency -B12 level 123 -Repletion will be started.  Subjective:  No fever, no chest pain, no palpitations, no nausea, no vomiting.  Feeling somnolent after using Neurontin (as per med rec); still off balance with ambulation.  Physical Exam: Vitals:   07/20/22 2100 07/21/22 0528 07/21/22 0926 07/21/22 1434  BP: (!) 145/81 (!) 155/88 112/85 119/77  Pulse: (!) 105 91 96 96  Resp: 20 17 16 16   Temp: 98.7 F (37.1 C) 98 F (36.7 C) 98.5 F (36.9 C) (!) 97.5 F (36.4 C)  TempSrc:  Oral  Oral  SpO2: 94% 96% 96% 98%  Weight:       Height:       General exam: Alert, awake, oriented x 3; somnolent after using Neurontin (chronic prescription from med rec); no chest pain, no fever, no nausea vomiting. Respiratory system: Clear to auscultation. Respiratory effort normal.  Good saturation on room air. Cardiovascular system:RRR. No rubs or gallops; no JVD. Gastrointestinal system: Abdomen is nondistended, soft and nontender. No organomegaly or masses felt. Normal bowel sounds heard. Central nervous system: No focal motor appreciated; patient is present poor balance. Extremities: No cyanosis or clubbing. Skin: No petechiae. Psychiatry: Judgement and insight appear normal. Mood & affect appropriate.    Data Reviewed: No new blood work.  Appreciated.  Family Communication: no family at bedside.  Disposition: Status is: Inpatient   Planned Discharge Destination: to be determined.  Physical therapy has recommended skilled nursing facility at discharge.  Family has opted discharge to ALF with [facility] services.   Author: [facility], MD 07/21/2022 5:09 PM  For on call review [email or web address]/23/[email or web address].

## 2022-07-21 NOTE — Progress Notes (Cosign Needed)
Physical Therapy Treatment Patient Details Name: Carrie Sanders MRN: [id number] DOB: 12-23-1926 Today's Date: 07/21/2022   History of Present Illness Carrie Sanders is a 88 y.o. female with medical history significant of arthritis, hyperlipidemia, hypertension, and more presents to ED with a chief complaint of blurry vision.  Patient reports that she was in her normal state of health, when around 4 PM on August 19 she suddenly became very dizzy.  She reports that she lost her balance and almost fell.  Likely she was in the bathroom where she could just sit on the commode.  She reports that every time she looks left she became very dizzy and had double vision or blurred vision.  Patient reports no headache with this.  She had no difficulty speaking, no difficulty swallowing.  She did not have any asymmetric weakness, or numbness.  She denies numbness on the face.  Patient reports that she has never had a stroke.  She does have a strong family history of aneurysms of the brain per her report.  Code stroke was called and she scored a 1 on NIHSS for gaze.  Neurology recommended stroke work-up at [facility].  Neurochecks every 30 minutes until patient is out of TNK window which was 9:30 PM August 19 per neuro.  Permissive hypertension for 48 hours.  Of the brain.  MRA head and neck.  Echo.  Aspirin 81 mg daily and Plavix 75 mg daily for 21 days    PT Comments    Pt sitting up in chair with visitor present completing LE therex independently.  Pt c/o some dizziness from new medication given but otherwise feeling stronger.  Pt instructed with seated march and ankle pumps.  Safety cues for transfers as tends to pull up with AD rather than push up from chair.  Pt with some navigational cues needed and tends to take uneven step lengths at times.  Pt did c/o Rt knee giving way and one episode of "can't find my feet" in which pt panicked as if she were falling.  Also noted "jerky" movements at times and forward bent  posturing.  Encouraged forward gaze with ambulation, however reported this made her more dizzy with new medication she is taking. Pt returned to chair with visitor still present.  Pt reported general fatigue following activity today.     Recommendations for follow up therapy are one component of a multi-disciplinary discharge planning process, led by the attending physician.  Recommendations may be updated based on patient status, additional functional criteria and insurance authorization.  Follow Up Recommendations  Skilled nursing-short term rehab (<3 hours/day) Can patient physically be transported by private vehicle: Yes   Assistance Recommended at Discharge Intermittent Supervision/Assistance  Patient can return home with the following A little help with walking and/or transfers;A little help with bathing/dressing/bathroom;Assistance with cooking/housework   Equipment Recommendations  None recommended by PT       Precautions / Restrictions Precautions Precautions: Fall Restrictions Weight Bearing Restrictions: No     Mobility  Bed Mobility Overal bed mobility: Modified Independent Bed Mobility: Supine to Sit, Sit to Supine     Supine to sit: Supervision, HOB elevated, Modified independent (Device/Increase time) Sit to supine: Supervision, HOB elevated, Modified independent (Device/Increase time)        Transfers Overall transfer level: Needs assistance Equipment used: Rolling walker (2 wheels) Transfers: Sit to/from Stand Sit to Stand: Min assist           General transfer comment: Pt with  cues for hand placement/pushing up from chair rather than pulling on walker, general instabiliy upon standing.    Ambulation/Gait Ambulation/Gait assistance: Min assist, Mod assist Gait Distance (Feet): 60 Feet Assistive device: Rolling walker (2 wheels) Gait Pattern/deviations: Decreased step length - right, Decreased step length - left, Decreased stride length, Knees  buckling, Antalgic, Trunk flexed, Narrow base of support   Gait velocity interpretation: <1.31 ft/sec, indicative of household ambulator   General Gait Details: Pt with episodes of Rt knee buckling and dizziness during gait today.  Pt remains forward flexed with cues for forward gaze         Cognition Arousal/Alertness: Awake/alert Behavior During Therapy: WFL for tasks assessed/performed Overall Cognitive Status: Within Functional Limits for tasks assessed                                          Exercises General Exercises - Lower Extremity Ankle Circles/Pumps: AROM, Right, Left, 10 reps Heel Slides: AROM, Right, Left, 10 reps Hip Flexion/Marching: AROM, Right, Left, 10 reps      PT Goals (current goals can now be found in the care plan section) Progress towards PT goals: Progressing toward goals    Frequency    Min 3X/week      PT Plan  Continue per progression    Co-evaluation  N/A            AM-PAC PT "6 Clicks" Mobility   Outcome Measure  Help needed turning from your back to your side while in a flat bed without using bedrails?: None Help needed moving from lying on your back to sitting on the side of a flat bed without using bedrails?: A Little Help needed moving to and from a bed to a chair (including a wheelchair)?: A Little Help needed standing up from a chair using your arms (e.g., wheelchair or bedside chair)?: A Little Help needed to walk in hospital room?: A Lot Help needed climbing 3-5 steps with a railing? : A Lot 6 Click Score: 17    End of Session Equipment Utilized During Treatment: Gait belt Activity Tolerance: Patient tolerated treatment well;Patient limited by fatigue Patient left: in bed;with nursing/sitter in room;with call bell/phone within reach Nurse Communication: Mobility status PT Visit Diagnosis: Unsteadiness on feet (R26.81);Other abnormalities of gait and mobility (R26.89);Muscle weakness (generalized)  (M62.81);Other symptoms and signs involving the nervous system (R29.898)     Time: 3546-5681 PT Time Calculation (min) (ACUTE ONLY): 20 min  Charges:  $Gait Training: 8-22 mins                    Lurena Nida, PTA/CLT [facility] Ph: [phone]    Lurena Nida 07/21/2022, 10:44 AM

## 2022-07-21 NOTE — Care Management Important Message (Signed)
Important Message  Patient Details  Name: Carrie Sanders MRN: [id number] Date of Birth: 23-Jun-1927   Medicare Important Message Given:  N/A - LOS <3 / Initial given by admissions     Corey Harold 07/21/2022, 11:27 AM

## 2022-07-22 DIAGNOSIS — I639 Cerebral infarction, unspecified: Secondary | ICD-10-CM | POA: Diagnosis not present

## 2022-07-22 MED ORDER — CLOPIDOGREL BISULFATE 75 MG PO TABS: 75.0000 mg | ORAL_TABLET | Freq: Every day | ORAL | 0 refills | Status: AC

## 2022-07-22 MED ORDER — ASPIRIN 81 MG PO CHEW: 81.0000 mg | CHEWABLE_TABLET | Freq: Every day | ORAL | Status: AC

## 2022-07-22 MED ORDER — VITAMIN B-12 1000 MCG PO TABS: 1000.0000 ug | ORAL_TABLET | Freq: Every day | ORAL | Status: DC

## 2022-07-22 MED ORDER — TRAMADOL HCL 50 MG PO TABS
50.0000 mg | ORAL_TABLET | Freq: Every day | ORAL | 0 refills | Status: DC
Start: 2022-07-22 — End: 2023-04-27

## 2022-07-22 NOTE — Discharge Summary (Addendum)
Physician Discharge Summary  Carrie Sanders [id number] DOB: 04-23-1927 DOA: 07/18/2022  PCP: Celene Squibb, MD  Admit date: 07/18/2022 Discharge date: 07/22/2022  Admitted From: home Disposition:  SNF  Recommendations for Outpatient Follow-up:  Follow up with PCP in 1-2 weeks Please obtain BMP/CBC in one week Continue aspirin and Plavix for 3 weeks, followed by aspirin alone Recommend home health PT/OT following discharge  Home Health: none Equipment/Devices: none  Discharge Condition: stable CODE STATUS: DNR Diet Orders (From admission, onward)     Start     Ordered   07/18/22 2102  Diet Heart Room service appropriate? Yes; Fluid consistency: Thin  Diet effective now       Comments: Pending bedside swallow test  Question Answer Comment  Room service appropriate? Yes   Fluid consistency: Thin      07/18/22 2101            HPI: Per admitting MD, Carrie Sanders is a 88 y.o. female with medical history significant of arthritis, hyperlipidemia, hypertension, and more presents to ED with a chief complaint of blurry vision.  Patient reports that she was in her normal state of health, when around 4 PM on August 19 she suddenly became very dizzy.  She reports that she lost her balance and almost fell.  Likely she was in the bathroom where she could just sit on the commode.  She reports that every time she looks left she became very dizzy and had double vision or blurred vision.  Patient reports no headache with this.  She had no difficulty speaking, no difficulty swallowing.  She did not have any asymmetric weakness, or numbness.  She denies numbness on the face.  Patient reports that she has never had a stroke.  She does have a strong family history of aneurysms of the brain per her report.  Code stroke was called and she scored a 1 on NIHSS for gaze.  Neurology recommended stroke work-up at [facility].  Neurochecks every 30 minutes until patient is out of TNK window which was  9:30 PM August 19 per neuro.  Permissive hypertension for 48 hours.  Of the brain.  MRA head and neck.  Echo.  Aspirin 81 mg daily and Plavix 75 mg daily for 21 days.  Hospital Course / Discharge diagnoses: Principal Problem:   Stroke [facility]) Active Problems:   HTN (hypertension)   HLD (hyperlipidemia)   GERD (gastroesophageal reflux disease)   Hypokalemia   Stroke-like symptoms   Stroke [facility]) -patient was admitted to the hospital with strokelike symptoms.  Neurology consulted, recommending patient to stay at [facility] rather than being transferred to [facility].  Underwent an MRI which showed small acute/subacute infarcts in the cerebellar region and pons area.  Underwent carotid Dopplers that showed moderate heterogeneous and calcified plaque, with no hemodynamically significant stenosis by Doppler criteria.  MRA was without large vessel occlusions.  2D echo showed preserved EF, no significant valvular disorder, normal RV.  She does have diastolic dysfunction and LVH.  LDL was 79, A1c was normal.  Patient was placed on aspirin and Plavix for 21 days, then continue aspirin alone.  She has worked with therapies, SNF was recommended and she will be discharged in stable condition.  Active problems Hypokalemia-repleted, now normalized GERD (gastroesophageal reflux disease)-Continue protonix HLD (hyperlipidemia) -Continue crestor -Lipid panel demonstrating LDL 79, HDL 97, triglycerides 70 and total cholesterol 190. HTN (hypertension) -permissive HTN for 24-48 hours allowed, now back on her home regimen.  Continue  B12 deficiency -B12 level 123, repletion will be started.  Sepsis ruled out   Discharge Instructions   Allergies as of 07/22/2022       Reactions   Codeine Nausea Only, Other (See Comments)   Pass out   Sulfonamide Derivatives Rash        Medication List     STOP taking these medications    gabapentin 100 MG capsule Commonly known as: NEURONTIN       TAKE these medications     aspirin 81 MG chewable tablet Chew 1 tablet (81 mg total) by mouth daily. Start taking on: July 23, 2022   bumetanide 1 MG tablet Commonly known as: BUMEX Take 1 mg by mouth daily.   clopidogrel 75 MG tablet Commonly known as: PLAVIX Take 1 tablet (75 mg total) by mouth daily for 21 days. Start taking on: July 23, 2022   losartan 100 MG tablet Commonly known as: COZAAR Take 50 mg by mouth every morning.   meloxicam 7.5 MG tablet Commonly known as: MOBIC Take 7.5 mg by mouth every morning.   omeprazole 20 MG capsule Commonly known as: PRILOSEC Take 20 mg by mouth daily.   rosuvastatin 20 MG tablet Commonly known as: CRESTOR Take 20 mg by mouth daily after breakfast.   traMADol 50 MG tablet Commonly known as: ULTRAM Take 1 tablet (50 mg total) by mouth at bedtime.        Consultations: Neurology   Procedures/Studies:  DG Chest 2 View  Result Date: 07/20/2022 CLINICAL DATA:  Screening for pulmonary tuberculosis EXAM: CHEST - 2 VIEW COMPARISON:  03/09/2012 FINDINGS: Cardiac size is within normal limits. Increase in AP diameter of chest suggests COPD. There are no signs of pulmonary edema or focal pulmonary consolidation. There is no pleural effusion or pneumothorax. Degenerative changes are noted in both shoulders, more severe on the left side. IMPRESSION: No active cardiopulmonary disease. Electronically Signed   By: Ernie Avena M.D.   On: 07/20/2022 15:36   MR BRAIN WO CONTRAST  Result Date: 07/20/2022 CLINICAL DATA:  Stroke, follow up. Dizziness and blurry vision. Internuclear ophthalmoplegia. EXAM: MRI HEAD WITHOUT CONTRAST MRA HEAD WITHOUT CONTRAST TECHNIQUE: Multiplanar, multi-echo pulse sequences of the brain and surrounding structures were acquired without intravenous contrast. Angiographic images of the Circle of Willis were acquired using MRA technique without intravenous contrast. COMPARISON:  Head CT 07/18/2022 FINDINGS: MRI HEAD FINDINGS Brain:  There is a 2 mm focus of mildly increased trace diffusion weighted signal intensity in the dorsal pons just right of midline on axial images (series 5, image 14). Correlation for reduced ADC is limited by its small size, and confirmation on coronal diffusion imaging is limited by slice selection, however this is suspicious for an acute or subacute infarct with this clinical history. Small subacute infarcts are present in the left cerebellar hemisphere with intrinsic T1 hyperintensity suggesting subacute blood products. A chronic right basal ganglia infarct is noted. There is also a small chronic cortical infarct in the left frontal lobe. Small T2 hyperintensities in the cerebral white matter bilaterally are nonspecific but compatible with mild chronic small vessel ischemic disease. No mass, midline shift, or extra-axial fluid collection is identified. Cerebral atrophy is within normal limits for age. Vascular: Major intracranial vascular flow voids are preserved. Skull and upper cervical spine: No suspicious marrow lesion. Disc and facet degeneration in the included cervical spine with multilevel anterolisthesis. Sinuses/Orbits: Bilateral cataract extraction. Clear paranasal sinuses. Trace bilateral mastoid effusions. Other: None. MRA HEAD FINDINGS  Anterior circulation: The internal carotid arteries are widely patent from skull base to carotid termini. ACAs and MCAs are patent without evidence of a proximal branch occlusion or significant proximal stenosis. No aneurysm is identified. Posterior circulation: Signal loss in the proximal aspects of both V4 segments is likely artifactual. The more distal V4 segments are widely patent to the basilar. Patent bilateral PICA, right AICA, and bilateral SCA origins are identified. The basilar artery is widely patent. Posterior communicating arteries are diminutive or absent. Both PCAs are patent without evidence of a significant proximal stenosis. No aneurysm is identified.  Anatomic variants: None. IMPRESSION: 1. Suspected punctate acute or subacute infarct in the dorsal pons. 2. Small subacute left cerebellar infarcts. 3. Mild chronic small vessel ischemic disease. 4. Negative head MRA. Electronically Signed   By: Logan Bores M.D.   On: 07/20/2022 10:31   MR ANGIO HEAD WO CONTRAST  Result Date: 07/20/2022 CLINICAL DATA:  Stroke, follow up. Dizziness and blurry vision. Internuclear ophthalmoplegia. EXAM: MRI HEAD WITHOUT CONTRAST MRA HEAD WITHOUT CONTRAST TECHNIQUE: Multiplanar, multi-echo pulse sequences of the brain and surrounding structures were acquired without intravenous contrast. Angiographic images of the Circle of Willis were acquired using MRA technique without intravenous contrast. COMPARISON:  Head CT 07/18/2022 FINDINGS: MRI HEAD FINDINGS Brain: There is a 2 mm focus of mildly increased trace diffusion weighted signal intensity in the dorsal pons just right of midline on axial images (series 5, image 14). Correlation for reduced ADC is limited by its small size, and confirmation on coronal diffusion imaging is limited by slice selection, however this is suspicious for an acute or subacute infarct with this clinical history. Small subacute infarcts are present in the left cerebellar hemisphere with intrinsic T1 hyperintensity suggesting subacute blood products. A chronic right basal ganglia infarct is noted. There is also a small chronic cortical infarct in the left frontal lobe. Small T2 hyperintensities in the cerebral white matter bilaterally are nonspecific but compatible with mild chronic small vessel ischemic disease. No mass, midline shift, or extra-axial fluid collection is identified. Cerebral atrophy is within normal limits for age. Vascular: Major intracranial vascular flow voids are preserved. Skull and upper cervical spine: No suspicious marrow lesion. Disc and facet degeneration in the included cervical spine with multilevel anterolisthesis.  Sinuses/Orbits: Bilateral cataract extraction. Clear paranasal sinuses. Trace bilateral mastoid effusions. Other: None. MRA HEAD FINDINGS Anterior circulation: The internal carotid arteries are widely patent from skull base to carotid termini. ACAs and MCAs are patent without evidence of a proximal branch occlusion or significant proximal stenosis. No aneurysm is identified. Posterior circulation: Signal loss in the proximal aspects of both V4 segments is likely artifactual. The more distal V4 segments are widely patent to the basilar. Patent bilateral PICA, right AICA, and bilateral SCA origins are identified. The basilar artery is widely patent. Posterior communicating arteries are diminutive or absent. Both PCAs are patent without evidence of a significant proximal stenosis. No aneurysm is identified. Anatomic variants: None. IMPRESSION: 1. Suspected punctate acute or subacute infarct in the dorsal pons. 2. Small subacute left cerebellar infarcts. 3. Mild chronic small vessel ischemic disease. 4. Negative head MRA. Electronically Signed   By: Logan Bores M.D.   On: 07/20/2022 10:31   ECHOCARDIOGRAM COMPLETE  Result Date: 07/19/2022    ECHOCARDIOGRAM REPORT   Patient Name:   DEONNI NASH [facility] Date of Exam: 07/19/2022 Medical Rec #:  [id number]       Height:       61.0 in Accession #:  [id number]      Weight:       119.9 lb Date of Birth:  [birth year]      BSA:          1.520 m Patient Age:    26 years        BP:           176/95 mmHg Patient Gender: F               HR:           87 bpm. Exam Location:  [facility] Procedure: 2D Echo, Cardiac Doppler and Color Doppler Indications:    Stroke  History:        Patient has no prior history of Echocardiogram examinations.                 Stroke; Risk Factors:Hypertension and Dyslipidemia.  Sonographer:    Wenda Low Referring Phys: [id number] ASIA B [place]  1. Left ventricular ejection fraction, by estimation, is 60 to 65%. The left ventricle has  normal function. The left ventricle has no regional wall motion abnormalities. There is severe asymmetric left ventricular hypertrophy of the basal-septal segment. Left ventricular diastolic parameters are consistent with Grade II diastolic dysfunction (pseudonormalization). Elevated left atrial pressure.  2. Right ventricular systolic function is normal. The right ventricular size is normal. There is normal pulmonary artery systolic pressure. The estimated right ventricular systolic pressure is Q000111Q mmHg.  3. A small pericardial effusion is present.  4. The mitral valve is degenerative. Trivial mitral valve regurgitation. Moderate mitral annular calcification.  5. The aortic valve was not well visualized. Aortic valve regurgitation is not visualized. Aortic valve sclerosis/calcification is present, without any evidence of aortic stenosis.  6. The inferior vena cava is normal in size with greater than 50% respiratory variability, suggesting right atrial pressure of 3 mmHg. FINDINGS  Left Ventricle: Left ventricular ejection fraction, by estimation, is 60 to 65%. The left ventricle has normal function. The left ventricle has no regional wall motion abnormalities. The left ventricular internal cavity size was normal in size. There is  severe asymmetric left ventricular hypertrophy of the basal-septal segment. Left ventricular diastolic parameters are consistent with Grade II diastolic dysfunction (pseudonormalization). Elevated left atrial pressure. Right Ventricle: The right ventricular size is normal. No increase in right ventricular wall thickness. Right ventricular systolic function is normal. There is normal pulmonary artery systolic pressure. The tricuspid regurgitant velocity is 2.24 m/s, and  with an assumed right atrial pressure of 3 mmHg, the estimated right ventricular systolic pressure is Q000111Q mmHg. Left Atrium: Left atrial size was normal in size. Right Atrium: Right atrial size was normal in size.  Pericardium: A small pericardial effusion is present. Mitral Valve: The mitral valve is degenerative in appearance. Moderate mitral annular calcification. Trivial mitral valve regurgitation. MV peak gradient, 6.7 mmHg. The mean mitral valve gradient is 3.0 mmHg. Tricuspid Valve: The tricuspid valve is normal in structure. Tricuspid valve regurgitation is trivial. Aortic Valve: The aortic valve was not well visualized. Aortic valve regurgitation is not visualized. Aortic valve sclerosis/calcification is present, without any evidence of aortic stenosis. Aortic valve mean gradient measures 6.0 mmHg. Aortic valve peak gradient measures 10.2 mmHg. Aortic valve area, by VTI measures 1.63 cm. Pulmonic Valve: The pulmonic valve was not well visualized. Pulmonic valve regurgitation is not visualized. Aorta: The aortic root is normal in size and structure. Venous: The inferior vena cava is normal in size with greater than 50% respiratory  variability, suggesting right atrial pressure of 3 mmHg. IAS/Shunts: The interatrial septum was not well visualized.  LEFT VENTRICLE PLAX 2D LVIDd:         3.80 cm     Diastology LVIDs:         2.10 cm     LV e' medial:    2.94 cm/s LV PW:         1.30 cm     LV E/e' medial:  32.8 LV IVS:        1.40 cm     LV e' lateral:   4.46 cm/s LVOT diam:     2.00 cm     LV E/e' lateral: 21.6 LV SV:         60 LV SV Index:   40 LVOT Area:     3.14 cm  LV Volumes (MOD) LV vol d, MOD A2C: 27.7 ml LV vol d, MOD A4C: 27.6 ml LV vol s, MOD A2C: 13.3 ml LV vol s, MOD A4C: 13.3 ml LV SV MOD A2C:     14.4 ml LV SV MOD A4C:     27.6 ml LV SV MOD BP:      14.4 ml RIGHT VENTRICLE RV Basal diam:  3.10 cm RV Mid diam:    2.20 cm RV S prime:     9.36 cm/s TAPSE (M-mode): 2.2 cm LEFT ATRIUM             Index        RIGHT ATRIUM          Index LA diam:        3.30 cm 2.17 cm/m   RA Area:     9.30 cm LA Vol (A2C):   35.6 ml 23.42 ml/m  RA Volume:   17.40 ml 11.45 ml/m LA Vol (A4C):   57.4 ml 37.77 ml/m LA Biplane  Vol: 45.2 ml 29.74 ml/m  AORTIC VALVE                     PULMONIC VALVE AV Area (Vmax):    1.85 cm      PV Vmax:       0.85 m/s AV Area (Vmean):   1.68 cm      PV Peak grad:  2.9 mmHg AV Area (VTI):     1.63 cm AV Vmax:           160.00 cm/s AV Vmean:          116.000 cm/s AV VTI:            0.370 m AV Peak Grad:      10.2 mmHg AV Mean Grad:      6.0 mmHg LVOT Vmax:         94.40 cm/s LVOT Vmean:        62.000 cm/s LVOT VTI:          0.192 m LVOT/AV VTI ratio: 0.52  AORTA Ao Root diam: 2.90 cm MITRAL VALVE                TRICUSPID VALVE MV Area (PHT): 7.29 cm     TR Peak grad:   20.1 mmHg MV Area VTI:   2.40 cm     TR Vmax:        224.00 cm/s MV Peak grad:  6.7 mmHg MV Mean grad:  3.0 mmHg     SHUNTS MV Vmax:       1.29 m/s  Systemic VTI:  0.19 m MV Vmean:      74.4 cm/s    Systemic Diam: 2.00 cm MV Decel Time: 104 msec MV E velocity: 96.50 cm/s MV A velocity: 122.00 cm/s MV E/A ratio:  0.79 Oswaldo Milian MD Electronically signed by Oswaldo Milian MD Signature Date/Time: 07/19/2022/2:27:18 PM    Final    US Carotid Bilateral  Result Date: 07/19/2022 CLINICAL DATA:  88 year old female with a history stroke and blurry vision EXAM: BILATERAL CAROTID DUPLEX ULTRASOUND TECHNIQUE: Pearline Cables scale imaging, color Doppler and duplex ultrasound were performed of bilateral carotid and vertebral arteries in the neck. COMPARISON:  None Available. FINDINGS: Criteria: Quantification of carotid stenosis is based on velocity parameters that correlate the residual internal carotid diameter with NASCET-based stenosis levels, using the diameter of the distal internal carotid lumen as the denominator for stenosis measurement. The following velocity measurements were obtained: RIGHT ICA:  Systolic 82 cm/sec, Diastolic 14 cm/sec CCA:  15 cm/sec SYSTOLIC ICA/CCA RATIO:  1.6 ECA:  106 cm/sec LEFT ICA:  Systolic 82 cm/sec, Diastolic 20 cm/sec CCA:  61 cm/sec SYSTOLIC ICA/CCA RATIO:  1.4 ECA:  210 cm/sec Right Brachial  SBP: Not acquired Left Brachial SBP: Not acquired RIGHT CAROTID ARTERY: No significant calcifications of the right common carotid artery. Intermediate waveform maintained. Moderate heterogeneous and partially calcified plaque at the right carotid bifurcation. Lumen shadowing. Low resistance waveform of the right ICA. Tortuosity RIGHT VERTEBRAL ARTERY: Antegrade flow with low resistance waveform. LEFT CAROTID ARTERY: No significant calcifications of the left common carotid artery. Intermediate waveform maintained. Moderate heterogeneous and partially calcified plaque at the left carotid bifurcation. Lumen shadowing. Low resistance waveform of the left ICA. Tortuosity LEFT VERTEBRAL ARTERY:  Antegrade flow with low resistance waveform. IMPRESSION: Color duplex indicates moderate heterogeneous and calcified plaque, with no hemodynamically significant stenosis by duplex criteria in the extracranial cerebrovascular circulation. Signed, Dulcy Fanny. Nadene Rubins, RPVI [facility] Radiology Electronically Signed   By: Corrie Mckusick D.O.   On: 07/19/2022 11:41   CT HEAD CODE STROKE WO CONTRAST  Result Date: 07/18/2022 CLINICAL DATA:  Code stroke.  Neuro deficit, stroke suspected EXAM: CT HEAD WITHOUT CONTRAST TECHNIQUE: Contiguous axial images were obtained from the base of the skull through the vertex without intravenous contrast. RADIATION DOSE REDUCTION: This exam was performed according to the departmental dose-optimization program which includes automated exposure control, adjustment of the mA and/or kV according to patient size and/or use of iterative reconstruction technique. COMPARISON:  None Available. FINDINGS: Brain: There is no acute intracranial hemorrhage, extra-axial fluid collection, or acute infarct. Background parenchymal volume is normal for age. The ventricles are normal in size. A remote infarct in the right basal ganglia is unchanged. Small lacunar  infarcts in the left cerebellar hemisphere are new since 06/05/2022j but are still remote appearing gray-white differentiation is otherwise preserved. There is no mass effect or midline shift.  There is no mass lesion Vascular: There is calcification of the bilateral cavernous ICAs and vertebral arteries. Skull: Normal. Negative for fracture or focal lesion. Sinuses/Orbits: The imaged paranasal sinuses are clear. Bilateral lens implants are in place. The globes and orbits are otherwise unremarkable. Other: None. ASPECTS [facility] Stroke Program Early CT Score) - Ganglionic level infarction (caudate, lentiform nuclei, internal capsule, insula, M1-M3 cortex): 7 - Supraganglionic infarction (M4-M6 cortex): 3 Total score (0-10 with 10 being normal): 10 IMPRESSION: 1. No acute intracranial pathology. 2. ASPECTS is 10 3. Small infarcts in the left cerebellar hemisphere are  new since 2022 but are still remote in appearance; MRI may be considered for better characterization. A remote infarct in the right basal ganglia is unchanged. These results were called by telephone at the time of interpretation on 07/18/2022 at 5:46 pm to provider [facility] ZAMMIT , who verbally acknowledged these results. Electronically Signed   By: Valetta Mole M.D.   On: 07/18/2022 17:53     Subjective: - no chest pain, shortness of breath, no abdominal pain, nausea or vomiting.   Discharge Exam: BP 103/68 (BP Location: Left Arm)   Pulse 95   Temp 98.7 F (37.1 C) (Oral)   Resp 20   Ht 5\' 1"  (1.549 m)   Wt 54.4 kg   SpO2 98%   BMI 22.66 kg/m   General: Pt is alert, awake, not in acute distress Cardiovascular: RRR, S1/S2 +, no rubs, no gallops Respiratory: CTA bilaterally, no wheezing, no rhonchi Abdominal: Soft, NT, ND, bowel sounds + Extremities: no edema, no cyanosis   The results of significant diagnostics from this hospitalization (including imaging, microbiology, ancillary and laboratory) are listed below for reference.      Microbiology: Recent Results (from the past 240 hour(s))  Resp Panel by RT-PCR (Flu A&B, Covid) Anterior Nasal Swab     Status: None   Collection Time: 07/18/22  5:31 PM   Specimen: Anterior Nasal Swab  Result Value Ref Range Status   SARS Coronavirus 2 by RT PCR NEGATIVE NEGATIVE Final    Comment: (NOTE) SARS-CoV-2 target nucleic acids are NOT DETECTED.  The SARS-CoV-2 RNA is generally detectable in upper respiratory specimens during the acute phase of infection. The lowest concentration of SARS-CoV-2 viral copies this assay can detect is 138 copies/mL. A negative result does not preclude SARS-Cov-2 infection and should not be used as the sole basis for treatment or other patient management decisions. A negative result may occur with  improper specimen collection/handling, submission of specimen other than nasopharyngeal swab, presence of viral mutation(s) within the areas targeted by this assay, and inadequate number of viral copies(<138 copies/mL). A negative result must be combined with clinical observations, patient history, and epidemiological information. The expected result is Negative.  Fact Sheet for Patients:  EntrepreneurPulse.com.au  Fact Sheet for Healthcare Providers:  IncredibleEmployment.be  This test is no t yet approved or cleared by the [place] FDA and  has been authorized for detection and/or diagnosis of SARS-CoV-2 by FDA under an Emergency Use Authorization (EUA). This EUA will remain  in effect (meaning this test can be used) for the duration of the COVID-19 declaration under Section 564(b)(1) of the Act, 21 U.S.C.section 360bbb-3(b)(1), unless the authorization is terminated  or revoked sooner.       Influenza A by PCR NEGATIVE NEGATIVE Final   Influenza B by PCR NEGATIVE NEGATIVE Final    Comment: (NOTE) The Xpert Xpress SARS-CoV-2/FLU/RSV plus assay is intended as an aid in the diagnosis of influenza  from Nasopharyngeal swab specimens and should not be used as a sole basis for treatment. Nasal washings and aspirates are unacceptable for Xpert Xpress SARS-CoV-2/FLU/RSV testing.  Fact Sheet for Patients: EntrepreneurPulse.com.au  Fact Sheet for Healthcare Providers: IncredibleEmployment.be  This test is not yet approved or cleared by the [place] FDA and has been authorized for detection and/or diagnosis of SARS-CoV-2 by FDA under an Emergency Use Authorization (EUA). This EUA will remain in effect (meaning this test can be used) for the duration of the COVID-19 declaration under Section 564(b)(1) of the Act, 21  U.S.C. section 360bbb-3(b)(1), unless the authorization is terminated or revoked.  Performed at [facility], [address], [place], [place] [address]      Labs: Basic Metabolic Panel: Recent Labs  Lab 07/18/22 1802 07/19/22 0612 07/20/22 0518  NA 139 144 141  K 2.7* 4.1 3.8  CL 107 114* 112*  CO2 23 25 22   GLUCOSE 122* 89 96  BUN 20 17 17   CREATININE 1.40* 1.19* 1.20*  CALCIUM 9.1 9.1 9.5   Liver Function Tests: Recent Labs  Lab 07/18/22 1802 07/19/22 0612  AST 26 23  ALT 13 12  ALKPHOS 72 64  BILITOT 1.8* 1.6*  PROT 7.3 6.6  ALBUMIN 4.0 3.7   CBC: Recent Labs  Lab 07/18/22 1802 07/19/22 0612 07/20/22 0518  WBC 4.9 5.6 8.3  NEUTROABS 3.2  --   --   HGB 12.6 12.4 12.3  HCT 37.7 37.6 37.3  MCV 94.5 95.7 95.4  PLT 148* 134* 130*   CBG: Recent Labs  Lab 07/18/22 1728  GLUCAP 141*   Hgb A1c No results for input(s): "HGBA1C" in the last 72 hours. Lipid Profile No results for input(s): "CHOL", "HDL", "LDLCALC", "TRIG", "CHOLHDL", "LDLDIRECT" in the last 72 hours. Thyroid function studies No results for input(s): "TSH", "T4TOTAL", "T3FREE", "THYROIDAB" in the last 72 hours.  Invalid input(s): "FREET3" Urinalysis    Component Value Date/Time   COLORURINE STRAW (A) 07/18/2022 1731    APPEARANCEUR CLEAR 07/18/2022 1731   LABSPEC 1.003 (L) 07/18/2022 1731   PHURINE 6.0 07/18/2022 1731   GLUCOSEU NEGATIVE 07/18/2022 1731   HGBUR SMALL (A) 07/18/2022 1731   BILIRUBINUR NEGATIVE 07/18/2022 1731   KETONESUR NEGATIVE 07/18/2022 1731   PROTEINUR NEGATIVE 07/18/2022 1731   UROBILINOGEN 0.2 03/09/2012 1107   NITRITE NEGATIVE 07/18/2022 1731   LEUKOCYTESUR SMALL (A) 07/18/2022 1731    FURTHER DISCHARGE INSTRUCTIONS:   Get Medicines reviewed and adjusted: Please take all your medications with you for your next visit with your Primary MD   Laboratory/radiological data: Please request your Primary MD to go over all hospital tests and procedure/radiological results at the follow up, please ask your Primary MD to get all Hospital records sent to his/her office.   In some cases, they will be blood work, cultures and biopsy results pending at the time of your discharge. Please request that your primary care M.D. goes through all the records of your hospital data and follows up on these results.   Also Note the following: If you experience worsening of your admission symptoms, develop shortness of breath, life threatening emergency, suicidal or homicidal thoughts you must seek medical attention immediately by calling 911 or calling your MD immediately  if symptoms less severe.   You must read complete instructions/literature along with all the possible adverse reactions/side effects for all the Medicines you take and that have been prescribed to you. Take any new Medicines after you have completely understood and accpet all the possible adverse reactions/side effects.    Do not drive when taking Pain medications or sleeping medications (Benzodaizepines)   Do not take more than prescribed Pain, Sleep and Anxiety Medications. It is not advisable to combine anxiety,sleep and pain medications without talking with your primary care practitioner   Special Instructions: If you have smoked  or chewed Tobacco  in the last 2 yrs please stop smoking, stop any regular Alcohol  and or any Recreational drug use.   Wear Seat belts while driving.   Please note: You were cared for by  a hospitalist during your hospital stay. Once you are discharged, your primary care physician will handle any further medical issues. Please note that NO REFILLS for any discharge medications will be authorized once you are discharged, as it is imperative that you return to your primary care physician (or establish a relationship with a primary care physician if you do not have one) for your post hospital discharge needs so that they can reassess your need for medications and monitor your lab values.  Time coordinating discharge: 40 minutes  SIGNED:  Marzetta Board, MD, PhD 07/22/2022, 10:50 AM

## 2022-07-22 NOTE — [place] FL2 (Signed)
[place] MEDICAID FL2 LEVEL OF CARE SCREENING TOOL     IDENTIFICATION  Patient Name: Carrie Sanders Birthdate: 08-07-1927 Sex: female Admission Date (Current Location): 07/18/2022  [facility] and [place] Number:  [employer] and Address:  [facility],  [address], [place] [address]      Provider Number: [id number]  Attending Physician Name and Address:  Leatha Gilding, MD  Relative Name and Phone Number:  Caridad, Silveira (Daughter) [phone]    Current Level of Care: Hospital Recommended Level of Care: Assisted Living Facility Prior Approval Number:    Date Approved/Denied:   PASRR Number:    Discharge Plan: Other (Comment) (Assisted Living Facility)    Current Diagnoses: Patient Active Problem List   Diagnosis Date Noted   Ischemic stroke (HCC) 07/20/2022   Hypokalemia 07/19/2022   Stroke-like symptoms 07/19/2022   Stroke (HCC) 07/18/2022   HTN (hypertension) 07/18/2022   HLD (hyperlipidemia) 07/18/2022   GERD (gastroesophageal reflux disease) 07/18/2022   Spinal stenosis at L4-L5 level 07/24/2016   Paresthesia 05/26/2016   Gait disorder 05/26/2016   S/P Left THA, AA 03/17/2012   KNEE, ARTHRITIS, DEGEN./OSTEO 05/23/2009   KNEE PAIN 05/23/2009   ANSERINE BURSITIS, LEFT 05/23/2009   TRIGGER FINGER 05/23/2009   HAND PAIN 05/23/2009   HIGH BLOOD PRESSURE 05/23/2009    Orientation RESPIRATION BLADDER Height & Weight     Self, Time, Situation, Place  Normal Incontinent Weight: 119 lb 14.9 oz (54.4 kg) Height:  5\' 1"  (154.9 cm)  BEHAVIORAL SYMPTOMS/MOOD NEUROLOGICAL BOWEL NUTRITION STATUS      Continent Diet (Heart Healthy)  AMBULATORY STATUS COMMUNICATION OF NEEDS Skin   Limited Assist Verbally Normal                       Personal Care Assistance Level of Assistance  Bathing, Feeding, Dressing Bathing Assistance: Limited assistance Feeding assistance: Independent Dressing Assistance: Limited assistance      Functional Limitations Info  Sight, Hearing, Speech Sight Info: Adequate Hearing Info: Impaired Speech Info: Adequate    SPECIAL CARE FACTORS FREQUENCY  PT (By licensed PT), OT (By licensed OT)     PT Frequency: Home Health OT Frequency: Home Health            Contractures Contractures Info: Not present    Additional Factors Info  Code Status, Allergies Code Status Info: DNR Allergies Info: Codeine and Sulfonamide Derivatives           Current Medications (07/22/2022):  This is the current hospital active medication list Current Facility-Administered Medications  Medication Dose Route Frequency Provider Last Rate Last Admin   acetaminophen (TYLENOL) tablet 650 mg  650 mg Oral Q4H PRN Zierle-Ghosh, Asia B, DO       Or   acetaminophen (TYLENOL) 160 MG/5ML solution 650 mg  650 mg Per Tube Q4H PRN Zierle-Ghosh, Asia B, DO       Or   acetaminophen (TYLENOL) suppository 650 mg  650 mg Rectal Q4H PRN Zierle-Ghosh, Asia B, DO       aspirin chewable tablet 81 mg  81 mg Oral Daily Zierle-Ghosh, Asia B, DO   81 mg at 07/22/22 0942   bumetanide (BUMEX) tablet 1 mg  1 mg Oral Daily 07/24/22, MD   1 mg at 07/22/22 0942   clopidogrel (PLAVIX) tablet 75 mg  75 mg Oral Daily 07/24/22, MD   75 mg at 07/22/22 07/24/22   cyanocobalamin (VITAMIN B12) injection 1,000 mcg  1,000 mcg Intramuscular Daily Vassie Loll, MD   1,000 mcg at 07/22/22 0944   heparin injection 5,000 Units  5,000 Units Subcutaneous Q8H Zierle-Ghosh, Asia B, DO   5,000 Units at 07/22/22 0646   labetalol (NORMODYNE) injection 20 mg  20 mg Intravenous Q6H PRN Vassie Loll, MD   20 mg at 07/19/22 1233   losartan (COZAAR) tablet 50 mg  50 mg Oral q morning Zierle-Ghosh, Asia B, DO   50 mg at 07/22/22 0942   pantoprazole (PROTONIX) EC tablet 40 mg  40 mg Oral Daily Zierle-Ghosh, Asia B, DO   40 mg at 07/22/22 0942   rosuvastatin (CRESTOR) tablet 10 mg  10 mg Oral q1800 Zierle-Ghosh, Asia B, DO   10 mg at  07/21/22 1738   senna-docusate (Senokot-S) tablet 1 tablet  1 tablet Oral QHS PRN Zierle-Ghosh, Asia B, DO       traMADol (ULTRAM) tablet 50 mg  50 mg Oral Q12H PRN Zierle-Ghosh, Asia B, DO   50 mg at 07/21/22 2049     Discharge Medications: TAKE these medications     aspirin 81 MG chewable tablet Chew 1 tablet (81 mg total) by mouth daily. Start taking on: July 23, 2022    bumetanide 1 MG tablet Commonly known as: BUMEX Take 1 mg by mouth daily.    clopidogrel 75 MG tablet Commonly known as: PLAVIX Take 1 tablet (75 mg total) by mouth daily for 21 days. Start taking on: July 23, 2022    losartan 100 MG tablet Commonly known as: COZAAR Take 50 mg by mouth every morning.    meloxicam 7.5 MG tablet Commonly known as: MOBIC Take 7.5 mg by mouth every morning.    omeprazole 20 MG capsule Commonly known as: PRILOSEC Take 20 mg by mouth daily.    rosuvastatin 20 MG tablet Commonly known as: CRESTOR Take 20 mg by mouth daily after breakfast.    traMADol 50 MG tablet Commonly known as: ULTRAM Take 1 tablet (50 mg total) by mouth at bedtime.            Relevant Imaging Results:  Relevant Lab Results:   Additional Information SSN: [id number]  Tamula Morrical, Juleen China, LCSW

## 2022-07-22 NOTE — Care Management Important Message (Signed)
Important Message  Patient Details  Name: Carrie Sanders MRN: [id number] Date of Birth: 1927-10-03   Medicare Important Message Given:  Yes     Corey Harold 07/22/2022, 12:15 PM

## 2022-08-04 DIAGNOSIS — M170 Bilateral primary osteoarthritis of knee: Secondary | ICD-10-CM | POA: Diagnosis not present

## 2022-08-04 DIAGNOSIS — D518 Other vitamin B12 deficiency anemias: Secondary | ICD-10-CM | POA: Diagnosis not present

## 2022-08-04 DIAGNOSIS — I10 Essential (primary) hypertension: Secondary | ICD-10-CM | POA: Diagnosis not present

## 2022-08-04 DIAGNOSIS — E876 Hypokalemia: Secondary | ICD-10-CM | POA: Diagnosis not present

## 2022-08-04 DIAGNOSIS — K2100 Gastro-esophageal reflux disease with esophagitis, without bleeding: Secondary | ICD-10-CM | POA: Diagnosis not present

## 2022-08-04 DIAGNOSIS — M48061 Spinal stenosis, lumbar region without neurogenic claudication: Secondary | ICD-10-CM | POA: Diagnosis not present

## 2022-08-04 DIAGNOSIS — E782 Mixed hyperlipidemia: Secondary | ICD-10-CM | POA: Diagnosis not present

## 2022-08-04 DIAGNOSIS — I6350 Cerebral infarction due to unspecified occlusion or stenosis of unspecified cerebral artery: Secondary | ICD-10-CM | POA: Diagnosis not present

## 2022-08-25 DIAGNOSIS — L0390 Cellulitis, unspecified: Secondary | ICD-10-CM | POA: Diagnosis not present

## 2022-08-31 DIAGNOSIS — Z96642 Presence of left artificial hip joint: Secondary | ICD-10-CM | POA: Diagnosis not present

## 2022-08-31 DIAGNOSIS — M6281 Muscle weakness (generalized): Secondary | ICD-10-CM | POA: Diagnosis not present

## 2022-08-31 DIAGNOSIS — R262 Difficulty in walking, not elsewhere classified: Secondary | ICD-10-CM | POA: Diagnosis not present

## 2022-08-31 DIAGNOSIS — I639 Cerebral infarction, unspecified: Secondary | ICD-10-CM | POA: Diagnosis not present

## 2022-08-31 DIAGNOSIS — R2689 Other abnormalities of gait and mobility: Secondary | ICD-10-CM | POA: Diagnosis not present

## 2022-08-31 DIAGNOSIS — R2681 Unsteadiness on feet: Secondary | ICD-10-CM | POA: Diagnosis not present

## 2022-08-31 DIAGNOSIS — I10 Essential (primary) hypertension: Secondary | ICD-10-CM | POA: Diagnosis not present

## 2022-08-31 DIAGNOSIS — M48061 Spinal stenosis, lumbar region without neurogenic claudication: Secondary | ICD-10-CM | POA: Diagnosis not present

## 2022-09-01 DIAGNOSIS — K2100 Gastro-esophageal reflux disease with esophagitis, without bleeding: Secondary | ICD-10-CM | POA: Diagnosis not present

## 2022-09-01 DIAGNOSIS — I6350 Cerebral infarction due to unspecified occlusion or stenosis of unspecified cerebral artery: Secondary | ICD-10-CM | POA: Diagnosis not present

## 2022-09-01 DIAGNOSIS — E876 Hypokalemia: Secondary | ICD-10-CM | POA: Diagnosis not present

## 2022-09-01 DIAGNOSIS — I10 Essential (primary) hypertension: Secondary | ICD-10-CM | POA: Diagnosis not present

## 2022-09-01 DIAGNOSIS — M48061 Spinal stenosis, lumbar region without neurogenic claudication: Secondary | ICD-10-CM | POA: Diagnosis not present

## 2022-09-01 DIAGNOSIS — E782 Mixed hyperlipidemia: Secondary | ICD-10-CM | POA: Diagnosis not present

## 2022-09-01 DIAGNOSIS — D518 Other vitamin B12 deficiency anemias: Secondary | ICD-10-CM | POA: Diagnosis not present

## 2022-09-01 DIAGNOSIS — L0390 Cellulitis, unspecified: Secondary | ICD-10-CM | POA: Diagnosis not present

## 2022-09-01 DIAGNOSIS — M170 Bilateral primary osteoarthritis of knee: Secondary | ICD-10-CM | POA: Diagnosis not present

## 2022-09-02 DIAGNOSIS — M6281 Muscle weakness (generalized): Secondary | ICD-10-CM | POA: Diagnosis not present

## 2022-09-02 DIAGNOSIS — M48061 Spinal stenosis, lumbar region without neurogenic claudication: Secondary | ICD-10-CM | POA: Diagnosis not present

## 2022-09-02 DIAGNOSIS — R2681 Unsteadiness on feet: Secondary | ICD-10-CM | POA: Diagnosis not present

## 2022-09-02 DIAGNOSIS — Z96642 Presence of left artificial hip joint: Secondary | ICD-10-CM | POA: Diagnosis not present

## 2022-09-02 DIAGNOSIS — R2689 Other abnormalities of gait and mobility: Secondary | ICD-10-CM | POA: Diagnosis not present

## 2022-09-02 DIAGNOSIS — I639 Cerebral infarction, unspecified: Secondary | ICD-10-CM | POA: Diagnosis not present

## 2022-09-02 DIAGNOSIS — I10 Essential (primary) hypertension: Secondary | ICD-10-CM | POA: Diagnosis not present

## 2022-09-04 DIAGNOSIS — I10 Essential (primary) hypertension: Secondary | ICD-10-CM | POA: Diagnosis not present

## 2022-09-04 DIAGNOSIS — I639 Cerebral infarction, unspecified: Secondary | ICD-10-CM | POA: Diagnosis not present

## 2022-09-04 DIAGNOSIS — R2689 Other abnormalities of gait and mobility: Secondary | ICD-10-CM | POA: Diagnosis not present

## 2022-09-04 DIAGNOSIS — M6281 Muscle weakness (generalized): Secondary | ICD-10-CM | POA: Diagnosis not present

## 2022-09-04 DIAGNOSIS — Z96642 Presence of left artificial hip joint: Secondary | ICD-10-CM | POA: Diagnosis not present

## 2022-09-04 DIAGNOSIS — M48061 Spinal stenosis, lumbar region without neurogenic claudication: Secondary | ICD-10-CM | POA: Diagnosis not present

## 2022-09-04 DIAGNOSIS — R2681 Unsteadiness on feet: Secondary | ICD-10-CM | POA: Diagnosis not present

## 2022-09-07 DIAGNOSIS — Z96642 Presence of left artificial hip joint: Secondary | ICD-10-CM | POA: Diagnosis not present

## 2022-09-07 DIAGNOSIS — R2689 Other abnormalities of gait and mobility: Secondary | ICD-10-CM | POA: Diagnosis not present

## 2022-09-07 DIAGNOSIS — I10 Essential (primary) hypertension: Secondary | ICD-10-CM | POA: Diagnosis not present

## 2022-09-07 DIAGNOSIS — R2681 Unsteadiness on feet: Secondary | ICD-10-CM | POA: Diagnosis not present

## 2022-09-07 DIAGNOSIS — I639 Cerebral infarction, unspecified: Secondary | ICD-10-CM | POA: Diagnosis not present

## 2022-09-07 DIAGNOSIS — M48061 Spinal stenosis, lumbar region without neurogenic claudication: Secondary | ICD-10-CM | POA: Diagnosis not present

## 2022-09-07 DIAGNOSIS — M6281 Muscle weakness (generalized): Secondary | ICD-10-CM | POA: Diagnosis not present

## 2022-09-08 DIAGNOSIS — I10 Essential (primary) hypertension: Secondary | ICD-10-CM | POA: Diagnosis not present

## 2022-09-08 DIAGNOSIS — M6281 Muscle weakness (generalized): Secondary | ICD-10-CM | POA: Diagnosis not present

## 2022-09-08 DIAGNOSIS — R2689 Other abnormalities of gait and mobility: Secondary | ICD-10-CM | POA: Diagnosis not present

## 2022-09-08 DIAGNOSIS — I639 Cerebral infarction, unspecified: Secondary | ICD-10-CM | POA: Diagnosis not present

## 2022-09-08 DIAGNOSIS — M48061 Spinal stenosis, lumbar region without neurogenic claudication: Secondary | ICD-10-CM | POA: Diagnosis not present

## 2022-09-08 DIAGNOSIS — R2681 Unsteadiness on feet: Secondary | ICD-10-CM | POA: Diagnosis not present

## 2022-09-08 DIAGNOSIS — Z96642 Presence of left artificial hip joint: Secondary | ICD-10-CM | POA: Diagnosis not present

## 2022-09-11 DIAGNOSIS — M48061 Spinal stenosis, lumbar region without neurogenic claudication: Secondary | ICD-10-CM | POA: Diagnosis not present

## 2022-09-11 DIAGNOSIS — M6281 Muscle weakness (generalized): Secondary | ICD-10-CM | POA: Diagnosis not present

## 2022-09-11 DIAGNOSIS — Z96642 Presence of left artificial hip joint: Secondary | ICD-10-CM | POA: Diagnosis not present

## 2022-09-11 DIAGNOSIS — I10 Essential (primary) hypertension: Secondary | ICD-10-CM | POA: Diagnosis not present

## 2022-09-11 DIAGNOSIS — R2689 Other abnormalities of gait and mobility: Secondary | ICD-10-CM | POA: Diagnosis not present

## 2022-09-11 DIAGNOSIS — R2681 Unsteadiness on feet: Secondary | ICD-10-CM | POA: Diagnosis not present

## 2022-09-11 DIAGNOSIS — I639 Cerebral infarction, unspecified: Secondary | ICD-10-CM | POA: Diagnosis not present

## 2022-09-14 DIAGNOSIS — R262 Difficulty in walking, not elsewhere classified: Secondary | ICD-10-CM | POA: Diagnosis not present

## 2022-09-14 DIAGNOSIS — I639 Cerebral infarction, unspecified: Secondary | ICD-10-CM | POA: Diagnosis not present

## 2022-09-14 DIAGNOSIS — M6281 Muscle weakness (generalized): Secondary | ICD-10-CM | POA: Diagnosis not present

## 2022-09-14 DIAGNOSIS — I10 Essential (primary) hypertension: Secondary | ICD-10-CM | POA: Diagnosis not present

## 2022-09-14 DIAGNOSIS — M48061 Spinal stenosis, lumbar region without neurogenic claudication: Secondary | ICD-10-CM | POA: Diagnosis not present

## 2022-09-14 DIAGNOSIS — Z96642 Presence of left artificial hip joint: Secondary | ICD-10-CM | POA: Diagnosis not present

## 2022-09-14 DIAGNOSIS — R2689 Other abnormalities of gait and mobility: Secondary | ICD-10-CM | POA: Diagnosis not present

## 2022-09-14 DIAGNOSIS — R2681 Unsteadiness on feet: Secondary | ICD-10-CM | POA: Diagnosis not present

## 2022-09-17 DIAGNOSIS — M48061 Spinal stenosis, lumbar region without neurogenic claudication: Secondary | ICD-10-CM | POA: Diagnosis not present

## 2022-09-17 DIAGNOSIS — Z96642 Presence of left artificial hip joint: Secondary | ICD-10-CM | POA: Diagnosis not present

## 2022-09-17 DIAGNOSIS — R2689 Other abnormalities of gait and mobility: Secondary | ICD-10-CM | POA: Diagnosis not present

## 2022-09-17 DIAGNOSIS — R2681 Unsteadiness on feet: Secondary | ICD-10-CM | POA: Diagnosis not present

## 2022-09-17 DIAGNOSIS — M6281 Muscle weakness (generalized): Secondary | ICD-10-CM | POA: Diagnosis not present

## 2022-09-17 DIAGNOSIS — I639 Cerebral infarction, unspecified: Secondary | ICD-10-CM | POA: Diagnosis not present

## 2022-09-17 DIAGNOSIS — I10 Essential (primary) hypertension: Secondary | ICD-10-CM | POA: Diagnosis not present

## 2022-09-18 DIAGNOSIS — R2689 Other abnormalities of gait and mobility: Secondary | ICD-10-CM | POA: Diagnosis not present

## 2022-09-18 DIAGNOSIS — M48061 Spinal stenosis, lumbar region without neurogenic claudication: Secondary | ICD-10-CM | POA: Diagnosis not present

## 2022-09-18 DIAGNOSIS — I10 Essential (primary) hypertension: Secondary | ICD-10-CM | POA: Diagnosis not present

## 2022-09-18 DIAGNOSIS — M6281 Muscle weakness (generalized): Secondary | ICD-10-CM | POA: Diagnosis not present

## 2022-09-18 DIAGNOSIS — R2681 Unsteadiness on feet: Secondary | ICD-10-CM | POA: Diagnosis not present

## 2022-09-18 DIAGNOSIS — I639 Cerebral infarction, unspecified: Secondary | ICD-10-CM | POA: Diagnosis not present

## 2022-09-18 DIAGNOSIS — Z96642 Presence of left artificial hip joint: Secondary | ICD-10-CM | POA: Diagnosis not present

## 2022-09-21 DIAGNOSIS — R2689 Other abnormalities of gait and mobility: Secondary | ICD-10-CM | POA: Diagnosis not present

## 2022-09-21 DIAGNOSIS — I10 Essential (primary) hypertension: Secondary | ICD-10-CM | POA: Diagnosis not present

## 2022-09-21 DIAGNOSIS — Z96642 Presence of left artificial hip joint: Secondary | ICD-10-CM | POA: Diagnosis not present

## 2022-09-21 DIAGNOSIS — M6281 Muscle weakness (generalized): Secondary | ICD-10-CM | POA: Diagnosis not present

## 2022-09-21 DIAGNOSIS — I639 Cerebral infarction, unspecified: Secondary | ICD-10-CM | POA: Diagnosis not present

## 2022-09-21 DIAGNOSIS — R2681 Unsteadiness on feet: Secondary | ICD-10-CM | POA: Diagnosis not present

## 2022-09-21 DIAGNOSIS — M48061 Spinal stenosis, lumbar region without neurogenic claudication: Secondary | ICD-10-CM | POA: Diagnosis not present

## 2022-09-22 DIAGNOSIS — R262 Difficulty in walking, not elsewhere classified: Secondary | ICD-10-CM | POA: Diagnosis not present

## 2022-09-22 DIAGNOSIS — R2681 Unsteadiness on feet: Secondary | ICD-10-CM | POA: Diagnosis not present

## 2022-09-22 DIAGNOSIS — Z96642 Presence of left artificial hip joint: Secondary | ICD-10-CM | POA: Diagnosis not present

## 2022-09-22 DIAGNOSIS — I10 Essential (primary) hypertension: Secondary | ICD-10-CM | POA: Diagnosis not present

## 2022-09-22 DIAGNOSIS — R2689 Other abnormalities of gait and mobility: Secondary | ICD-10-CM | POA: Diagnosis not present

## 2022-09-22 DIAGNOSIS — I639 Cerebral infarction, unspecified: Secondary | ICD-10-CM | POA: Diagnosis not present

## 2022-09-22 DIAGNOSIS — M6281 Muscle weakness (generalized): Secondary | ICD-10-CM | POA: Diagnosis not present

## 2022-09-22 DIAGNOSIS — M48061 Spinal stenosis, lumbar region without neurogenic claudication: Secondary | ICD-10-CM | POA: Diagnosis not present

## 2022-09-25 DIAGNOSIS — I10 Essential (primary) hypertension: Secondary | ICD-10-CM | POA: Diagnosis not present

## 2022-09-25 DIAGNOSIS — R2689 Other abnormalities of gait and mobility: Secondary | ICD-10-CM | POA: Diagnosis not present

## 2022-09-25 DIAGNOSIS — I639 Cerebral infarction, unspecified: Secondary | ICD-10-CM | POA: Diagnosis not present

## 2022-09-25 DIAGNOSIS — Z96642 Presence of left artificial hip joint: Secondary | ICD-10-CM | POA: Diagnosis not present

## 2022-09-25 DIAGNOSIS — R2681 Unsteadiness on feet: Secondary | ICD-10-CM | POA: Diagnosis not present

## 2022-09-25 DIAGNOSIS — M6281 Muscle weakness (generalized): Secondary | ICD-10-CM | POA: Diagnosis not present

## 2022-09-25 DIAGNOSIS — M48061 Spinal stenosis, lumbar region without neurogenic claudication: Secondary | ICD-10-CM | POA: Diagnosis not present

## 2022-10-06 DIAGNOSIS — Z8616 Personal history of COVID-19: Secondary | ICD-10-CM | POA: Diagnosis not present

## 2022-10-06 DIAGNOSIS — E876 Hypokalemia: Secondary | ICD-10-CM | POA: Diagnosis not present

## 2022-10-06 DIAGNOSIS — K2100 Gastro-esophageal reflux disease with esophagitis, without bleeding: Secondary | ICD-10-CM | POA: Diagnosis not present

## 2022-10-06 DIAGNOSIS — I6350 Cerebral infarction due to unspecified occlusion or stenosis of unspecified cerebral artery: Secondary | ICD-10-CM | POA: Diagnosis not present

## 2022-10-06 DIAGNOSIS — S80921A Unspecified superficial injury of right lower leg, initial encounter: Secondary | ICD-10-CM | POA: Diagnosis not present

## 2022-10-06 DIAGNOSIS — I10 Essential (primary) hypertension: Secondary | ICD-10-CM | POA: Diagnosis not present

## 2022-10-06 DIAGNOSIS — E782 Mixed hyperlipidemia: Secondary | ICD-10-CM | POA: Diagnosis not present

## 2022-10-06 DIAGNOSIS — M170 Bilateral primary osteoarthritis of knee: Secondary | ICD-10-CM | POA: Diagnosis not present

## 2022-10-06 DIAGNOSIS — M48061 Spinal stenosis, lumbar region without neurogenic claudication: Secondary | ICD-10-CM | POA: Diagnosis not present

## 2022-10-06 DIAGNOSIS — D518 Other vitamin B12 deficiency anemias: Secondary | ICD-10-CM | POA: Diagnosis not present

## 2022-10-06 DIAGNOSIS — R296 Repeated falls: Secondary | ICD-10-CM | POA: Diagnosis not present

## 2022-10-11 DIAGNOSIS — K219 Gastro-esophageal reflux disease without esophagitis: Secondary | ICD-10-CM | POA: Diagnosis not present

## 2022-10-11 DIAGNOSIS — Z7982 Long term (current) use of aspirin: Secondary | ICD-10-CM | POA: Diagnosis not present

## 2022-10-11 DIAGNOSIS — S51811D Laceration without foreign body of right forearm, subsequent encounter: Secondary | ICD-10-CM | POA: Diagnosis not present

## 2022-10-11 DIAGNOSIS — E538 Deficiency of other specified B group vitamins: Secondary | ICD-10-CM | POA: Diagnosis not present

## 2022-10-11 DIAGNOSIS — I5032 Chronic diastolic (congestive) heart failure: Secondary | ICD-10-CM | POA: Diagnosis not present

## 2022-10-11 DIAGNOSIS — Z7902 Long term (current) use of antithrombotics/antiplatelets: Secondary | ICD-10-CM | POA: Diagnosis not present

## 2022-10-11 DIAGNOSIS — S81811D Laceration without foreign body, right lower leg, subsequent encounter: Secondary | ICD-10-CM | POA: Diagnosis not present

## 2022-10-11 DIAGNOSIS — Z993 Dependence on wheelchair: Secondary | ICD-10-CM | POA: Diagnosis not present

## 2022-10-11 DIAGNOSIS — I110 Hypertensive heart disease with heart failure: Secondary | ICD-10-CM | POA: Diagnosis not present

## 2022-10-11 DIAGNOSIS — E785 Hyperlipidemia, unspecified: Secondary | ICD-10-CM | POA: Diagnosis not present

## 2022-10-11 DIAGNOSIS — M48061 Spinal stenosis, lumbar region without neurogenic claudication: Secondary | ICD-10-CM | POA: Diagnosis not present

## 2022-10-11 DIAGNOSIS — Z791 Long term (current) use of non-steroidal anti-inflammatories (NSAID): Secondary | ICD-10-CM | POA: Diagnosis not present

## 2022-10-11 DIAGNOSIS — R2689 Other abnormalities of gait and mobility: Secondary | ICD-10-CM | POA: Diagnosis not present

## 2022-10-11 DIAGNOSIS — M1711 Unilateral primary osteoarthritis, right knee: Secondary | ICD-10-CM | POA: Diagnosis not present

## 2022-10-11 DIAGNOSIS — Z9181 History of falling: Secondary | ICD-10-CM | POA: Diagnosis not present

## 2022-10-11 DIAGNOSIS — I69398 Other sequelae of cerebral infarction: Secondary | ICD-10-CM | POA: Diagnosis not present

## 2022-10-11 DIAGNOSIS — U071 COVID-19: Secondary | ICD-10-CM | POA: Diagnosis not present

## 2022-10-28 DIAGNOSIS — S81811D Laceration without foreign body, right lower leg, subsequent encounter: Secondary | ICD-10-CM | POA: Diagnosis not present

## 2022-10-28 DIAGNOSIS — S51811D Laceration without foreign body of right forearm, subsequent encounter: Secondary | ICD-10-CM | POA: Diagnosis not present

## 2022-10-28 DIAGNOSIS — U071 COVID-19: Secondary | ICD-10-CM | POA: Diagnosis not present

## 2022-10-28 DIAGNOSIS — I110 Hypertensive heart disease with heart failure: Secondary | ICD-10-CM | POA: Diagnosis not present

## 2022-10-28 DIAGNOSIS — I69398 Other sequelae of cerebral infarction: Secondary | ICD-10-CM | POA: Diagnosis not present

## 2022-10-28 DIAGNOSIS — I5032 Chronic diastolic (congestive) heart failure: Secondary | ICD-10-CM | POA: Diagnosis not present

## 2022-10-29 DIAGNOSIS — I5032 Chronic diastolic (congestive) heart failure: Secondary | ICD-10-CM | POA: Diagnosis not present

## 2022-10-29 DIAGNOSIS — S81811D Laceration without foreign body, right lower leg, subsequent encounter: Secondary | ICD-10-CM | POA: Diagnosis not present

## 2022-10-29 DIAGNOSIS — I110 Hypertensive heart disease with heart failure: Secondary | ICD-10-CM | POA: Diagnosis not present

## 2022-10-29 DIAGNOSIS — M179 Osteoarthritis of knee, unspecified: Secondary | ICD-10-CM | POA: Diagnosis not present

## 2022-10-29 DIAGNOSIS — S51811D Laceration without foreign body of right forearm, subsequent encounter: Secondary | ICD-10-CM | POA: Diagnosis not present

## 2022-10-29 DIAGNOSIS — G9519 Other vascular myelopathies: Secondary | ICD-10-CM | POA: Diagnosis not present

## 2022-10-29 DIAGNOSIS — I69398 Other sequelae of cerebral infarction: Secondary | ICD-10-CM | POA: Diagnosis not present

## 2022-10-29 DIAGNOSIS — U071 COVID-19: Secondary | ICD-10-CM | POA: Diagnosis not present

## 2022-11-03 DIAGNOSIS — U071 COVID-19: Secondary | ICD-10-CM | POA: Diagnosis not present

## 2022-11-03 DIAGNOSIS — S81811D Laceration without foreign body, right lower leg, subsequent encounter: Secondary | ICD-10-CM | POA: Diagnosis not present

## 2022-11-03 DIAGNOSIS — S51811D Laceration without foreign body of right forearm, subsequent encounter: Secondary | ICD-10-CM | POA: Diagnosis not present

## 2022-11-03 DIAGNOSIS — I69398 Other sequelae of cerebral infarction: Secondary | ICD-10-CM | POA: Diagnosis not present

## 2022-11-03 DIAGNOSIS — I5032 Chronic diastolic (congestive) heart failure: Secondary | ICD-10-CM | POA: Diagnosis not present

## 2022-11-03 DIAGNOSIS — I110 Hypertensive heart disease with heart failure: Secondary | ICD-10-CM | POA: Diagnosis not present

## 2022-11-10 DIAGNOSIS — E782 Mixed hyperlipidemia: Secondary | ICD-10-CM | POA: Diagnosis not present

## 2022-11-10 DIAGNOSIS — K219 Gastro-esophageal reflux disease without esophagitis: Secondary | ICD-10-CM | POA: Diagnosis not present

## 2022-11-10 DIAGNOSIS — D518 Other vitamin B12 deficiency anemias: Secondary | ICD-10-CM | POA: Diagnosis not present

## 2022-11-10 DIAGNOSIS — S80921A Unspecified superficial injury of right lower leg, initial encounter: Secondary | ICD-10-CM | POA: Diagnosis not present

## 2022-11-10 DIAGNOSIS — U071 COVID-19: Secondary | ICD-10-CM | POA: Diagnosis not present

## 2022-11-10 DIAGNOSIS — Z993 Dependence on wheelchair: Secondary | ICD-10-CM | POA: Diagnosis not present

## 2022-11-10 DIAGNOSIS — I6350 Cerebral infarction due to unspecified occlusion or stenosis of unspecified cerebral artery: Secondary | ICD-10-CM | POA: Diagnosis not present

## 2022-11-10 DIAGNOSIS — S51811D Laceration without foreign body of right forearm, subsequent encounter: Secondary | ICD-10-CM | POA: Diagnosis not present

## 2022-11-10 DIAGNOSIS — E876 Hypokalemia: Secondary | ICD-10-CM | POA: Diagnosis not present

## 2022-11-10 DIAGNOSIS — E785 Hyperlipidemia, unspecified: Secondary | ICD-10-CM | POA: Diagnosis not present

## 2022-11-10 DIAGNOSIS — Z791 Long term (current) use of non-steroidal anti-inflammatories (NSAID): Secondary | ICD-10-CM | POA: Diagnosis not present

## 2022-11-10 DIAGNOSIS — Z7982 Long term (current) use of aspirin: Secondary | ICD-10-CM | POA: Diagnosis not present

## 2022-11-10 DIAGNOSIS — I110 Hypertensive heart disease with heart failure: Secondary | ICD-10-CM | POA: Diagnosis not present

## 2022-11-10 DIAGNOSIS — Z8616 Personal history of COVID-19: Secondary | ICD-10-CM | POA: Diagnosis not present

## 2022-11-10 DIAGNOSIS — E538 Deficiency of other specified B group vitamins: Secondary | ICD-10-CM | POA: Diagnosis not present

## 2022-11-10 DIAGNOSIS — M1711 Unilateral primary osteoarthritis, right knee: Secondary | ICD-10-CM | POA: Diagnosis not present

## 2022-11-10 DIAGNOSIS — I5032 Chronic diastolic (congestive) heart failure: Secondary | ICD-10-CM | POA: Diagnosis not present

## 2022-11-10 DIAGNOSIS — M170 Bilateral primary osteoarthritis of knee: Secondary | ICD-10-CM | POA: Diagnosis not present

## 2022-11-10 DIAGNOSIS — I10 Essential (primary) hypertension: Secondary | ICD-10-CM | POA: Diagnosis not present

## 2022-11-10 DIAGNOSIS — S81811D Laceration without foreign body, right lower leg, subsequent encounter: Secondary | ICD-10-CM | POA: Diagnosis not present

## 2022-11-10 DIAGNOSIS — R296 Repeated falls: Secondary | ICD-10-CM | POA: Diagnosis not present

## 2022-11-10 DIAGNOSIS — K2100 Gastro-esophageal reflux disease with esophagitis, without bleeding: Secondary | ICD-10-CM | POA: Diagnosis not present

## 2022-11-10 DIAGNOSIS — M48061 Spinal stenosis, lumbar region without neurogenic claudication: Secondary | ICD-10-CM | POA: Diagnosis not present

## 2022-11-10 DIAGNOSIS — R2689 Other abnormalities of gait and mobility: Secondary | ICD-10-CM | POA: Diagnosis not present

## 2022-11-10 DIAGNOSIS — Z9181 History of falling: Secondary | ICD-10-CM | POA: Diagnosis not present

## 2022-11-10 DIAGNOSIS — Z7902 Long term (current) use of antithrombotics/antiplatelets: Secondary | ICD-10-CM | POA: Diagnosis not present

## 2022-11-10 DIAGNOSIS — I69398 Other sequelae of cerebral infarction: Secondary | ICD-10-CM | POA: Diagnosis not present

## 2022-11-17 DIAGNOSIS — I69398 Other sequelae of cerebral infarction: Secondary | ICD-10-CM | POA: Diagnosis not present

## 2022-11-17 DIAGNOSIS — S81811D Laceration without foreign body, right lower leg, subsequent encounter: Secondary | ICD-10-CM | POA: Diagnosis not present

## 2022-11-17 DIAGNOSIS — I5032 Chronic diastolic (congestive) heart failure: Secondary | ICD-10-CM | POA: Diagnosis not present

## 2022-11-17 DIAGNOSIS — U071 COVID-19: Secondary | ICD-10-CM | POA: Diagnosis not present

## 2022-11-17 DIAGNOSIS — S51811D Laceration without foreign body of right forearm, subsequent encounter: Secondary | ICD-10-CM | POA: Diagnosis not present

## 2022-11-17 DIAGNOSIS — I110 Hypertensive heart disease with heart failure: Secondary | ICD-10-CM | POA: Diagnosis not present

## 2022-11-25 DIAGNOSIS — G9519 Other vascular myelopathies: Secondary | ICD-10-CM | POA: Diagnosis not present

## 2022-11-25 DIAGNOSIS — M179 Osteoarthritis of knee, unspecified: Secondary | ICD-10-CM | POA: Diagnosis not present

## 2022-12-04 DIAGNOSIS — Z79899 Other long term (current) drug therapy: Secondary | ICD-10-CM | POA: Diagnosis not present

## 2022-12-08 DIAGNOSIS — M170 Bilateral primary osteoarthritis of knee: Secondary | ICD-10-CM | POA: Diagnosis not present

## 2022-12-08 DIAGNOSIS — I10 Essential (primary) hypertension: Secondary | ICD-10-CM | POA: Diagnosis not present

## 2022-12-08 DIAGNOSIS — K2100 Gastro-esophageal reflux disease with esophagitis, without bleeding: Secondary | ICD-10-CM | POA: Diagnosis not present

## 2022-12-08 DIAGNOSIS — M48061 Spinal stenosis, lumbar region without neurogenic claudication: Secondary | ICD-10-CM | POA: Diagnosis not present

## 2022-12-08 DIAGNOSIS — E782 Mixed hyperlipidemia: Secondary | ICD-10-CM | POA: Diagnosis not present

## 2022-12-08 DIAGNOSIS — I6350 Cerebral infarction due to unspecified occlusion or stenosis of unspecified cerebral artery: Secondary | ICD-10-CM | POA: Diagnosis not present

## 2022-12-08 DIAGNOSIS — D518 Other vitamin B12 deficiency anemias: Secondary | ICD-10-CM | POA: Diagnosis not present

## 2022-12-08 DIAGNOSIS — S80921A Unspecified superficial injury of right lower leg, initial encounter: Secondary | ICD-10-CM | POA: Diagnosis not present

## 2022-12-08 DIAGNOSIS — E876 Hypokalemia: Secondary | ICD-10-CM | POA: Diagnosis not present

## 2022-12-08 DIAGNOSIS — E559 Vitamin D deficiency, unspecified: Secondary | ICD-10-CM | POA: Diagnosis not present

## 2022-12-18 IMAGING — CT CT HEAD W/O CM
3 series · 14 of 47 positions shown, 16 images · non-contrast
Comparison: MR cervical spine December 23, 2016

CLINICAL DATA: Fall with head injury

EXAM:
CT HEAD WITHOUT CONTRAST
CT CERVICAL SPINE WITHOUT CONTRAST
TECHNIQUE: Multidetector CT imaging of the head and cervical spine was
performed following the standard protocol without intravenous
contrast. Multiplanar CT image reconstructions of the cervical spine
were also generated.

[Series 2: head w o · axial · 0.44mm/px · z∈[+1248,+1378]mm · 8 of 32 slices shown, 10 images]
[im 3/32  brain]
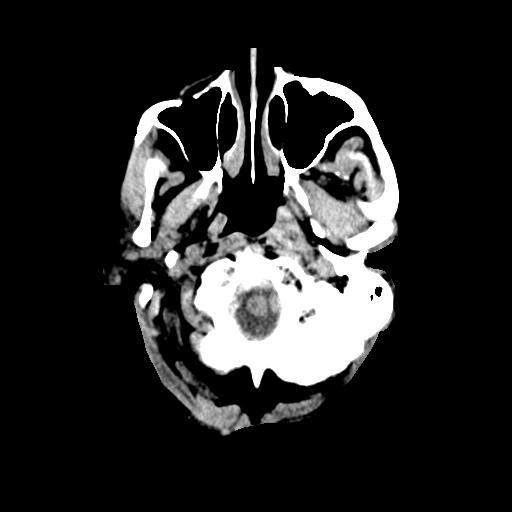
[im 3/32  bone]
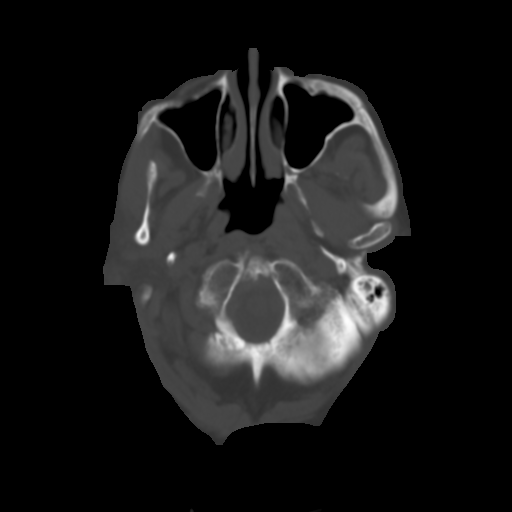
[im 7/32  brain]
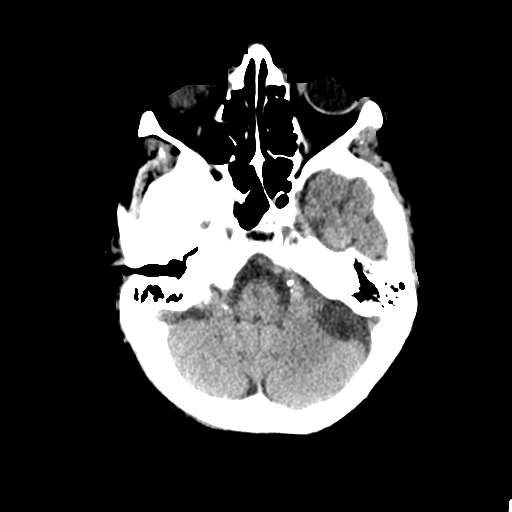
[im 10/32  brain]
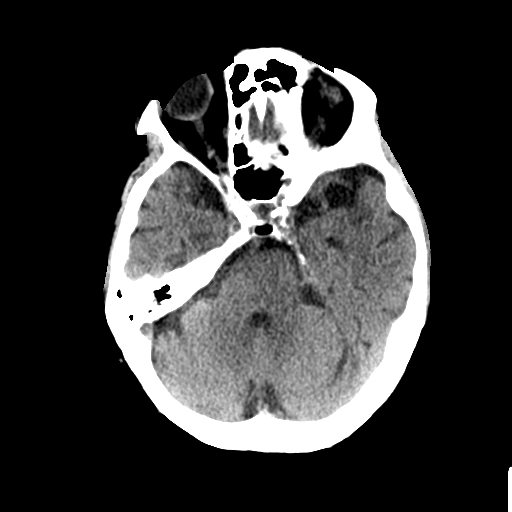
[im 14/32  brain]
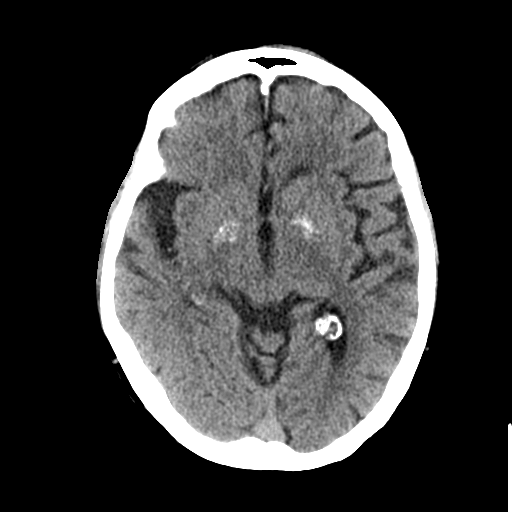
[im 18/32  brain]
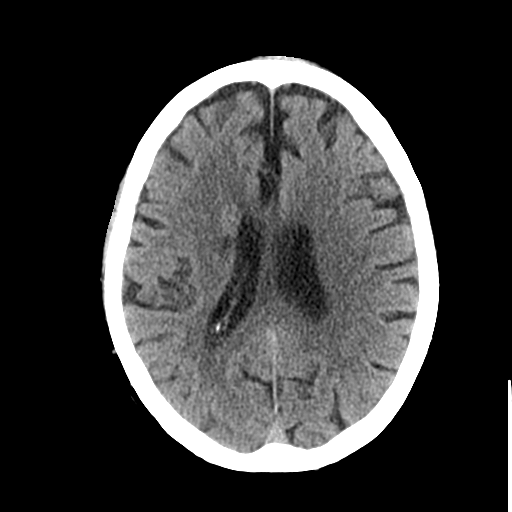
[im 18/32  bone]
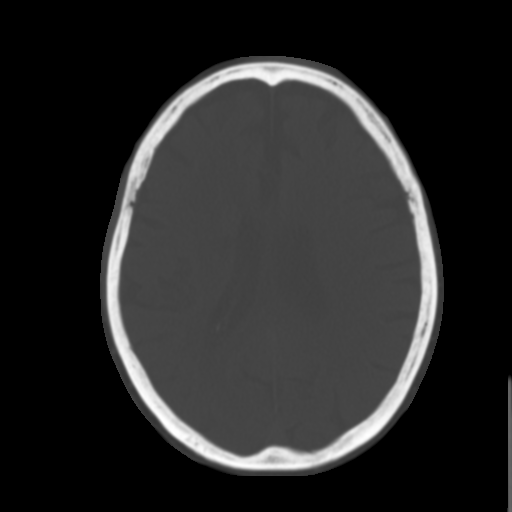
[im 22/32  brain]
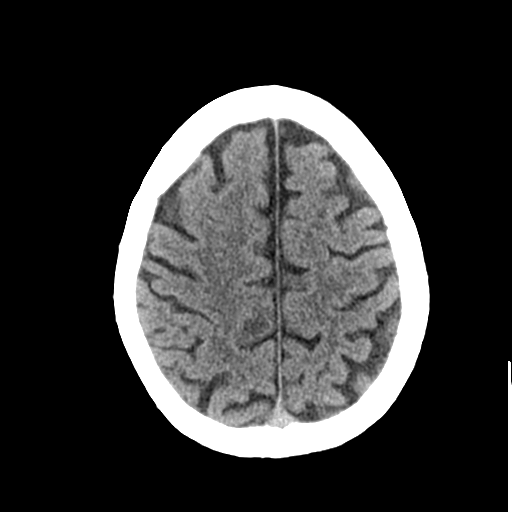
[im 25/32  brain]
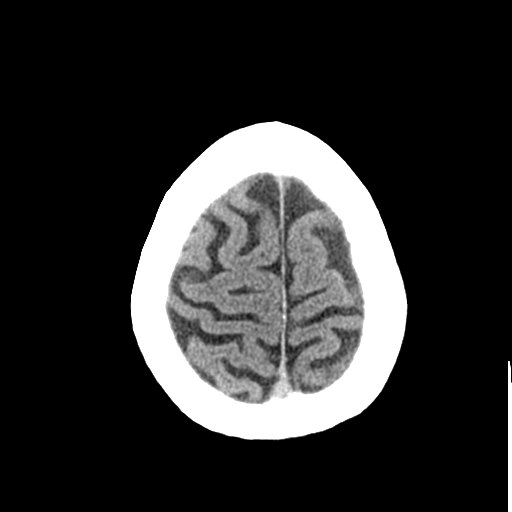
[im 29/32  brain]
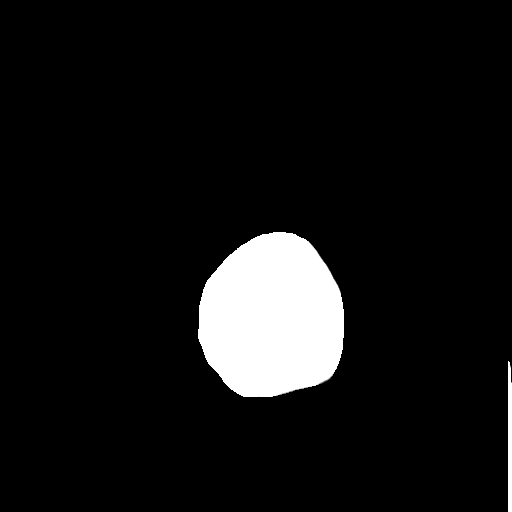

[Series 4: coronal soft · coronal · 0.31mm/px · 3 of 67 slices shown]
[im 23/67  brain]
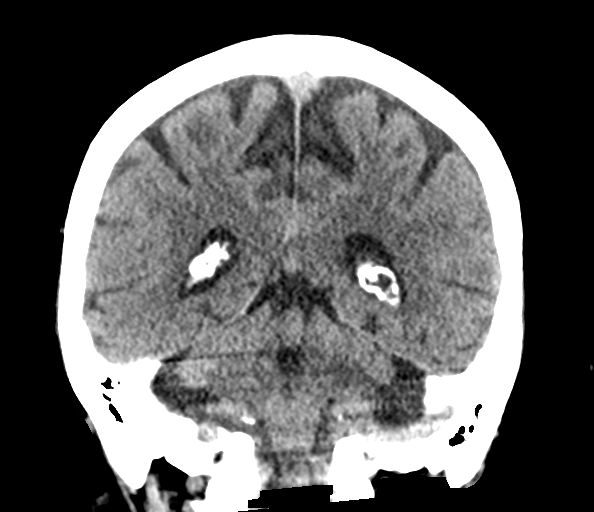
[im 30/67  brain]
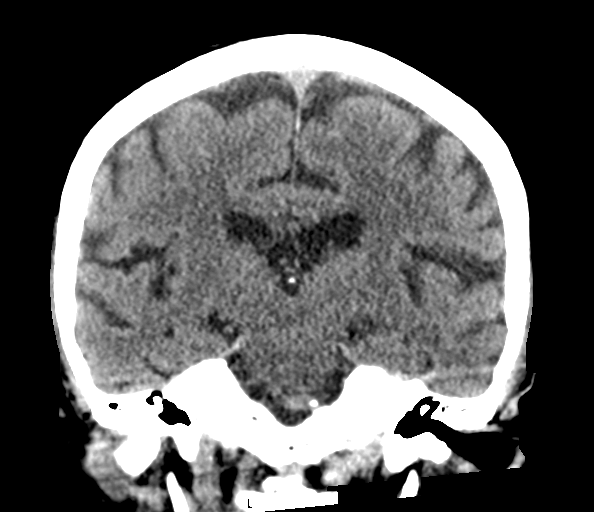
[im 37/67  brain]
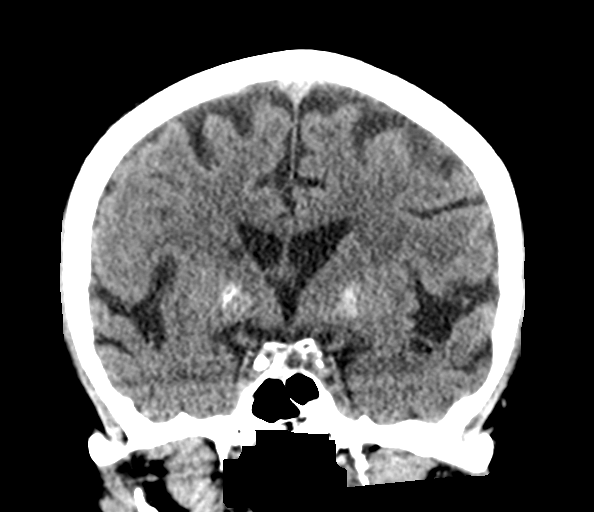

[Series 5: sagittal soft · sagittal · 0.31mm/px · 3 of 56 slices shown]
[im 19/56  brain]
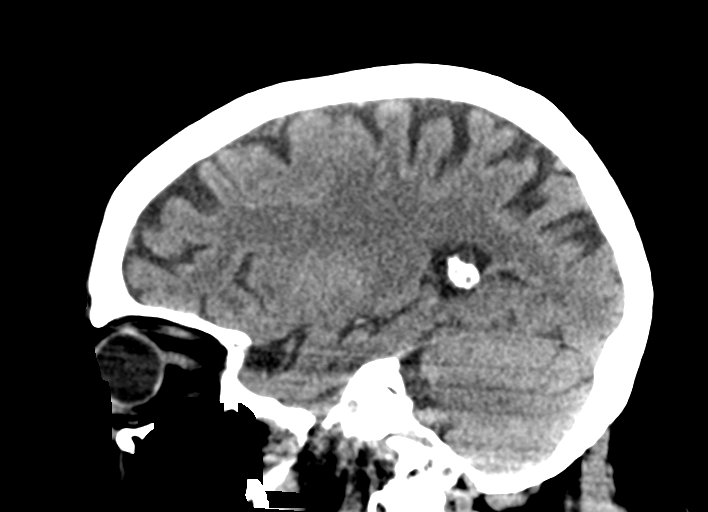
[im 28/56  brain]
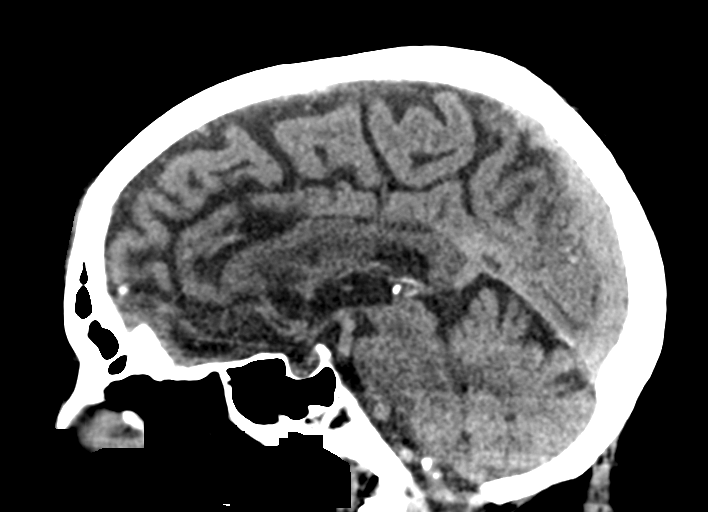
[im 37/56  brain]
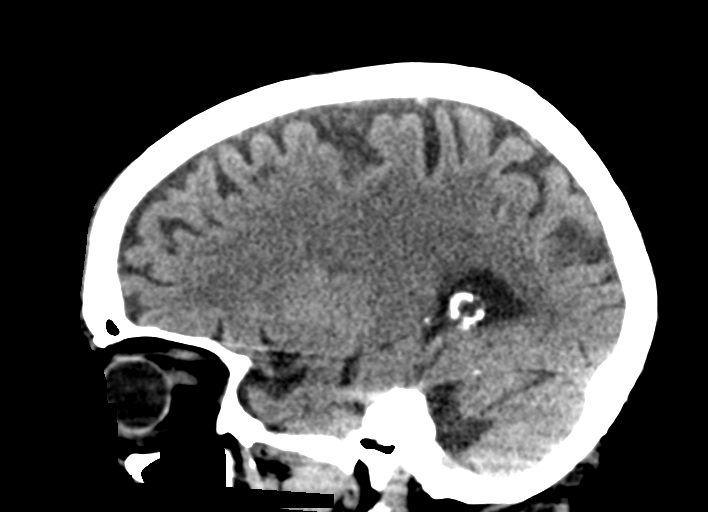

[14 of 47 positions shown; findings below may reference images not displayed]

FINDINGS: CT HEAD FINDINGS

Brain: No evidence of acute large vascular territory infarction,
hemorrhage, hydrocephalus, extra-axial collection or mass
lesion/mass effect. Mild for age global parenchymal volume loss.
Chronic lacunar type infarct in the right basal ganglia.
Mineralization of bilateral basal ganglia. White matter hypodensity
involving the paramedian right parietal lobe on image [DATE], sequela
of prior insult.

Vascular: No hyperdense vessel. Atherosclerotic calcifications of
the internal carotid and vertebral arteries at the skull base.

Skull: Soft tissue swelling centered over the left paramedian
frontal bone. No fracture or focal lesion identified.

Sinuses/Orbits: The visualized paranasal sinuses and mastoid air
cells are predominantly clear. Orbits are grossly unremarkable.

Other: Cerumen in the left external auditory canal. Dental hardware.

CT CERVICAL SPINE FINDINGS

Alignment: Straightening of the normal cervical lordosis. Stable
anterolisthesis of C3 on C4, C4 on C5 and C7 on T1. No evidence of
traumatic listhesis.

Skull base and vertebrae: No acute displaced fracture.

Soft tissues and spinal canal: No prevertebral fluid or swelling. No
visible canal hematoma.

Disc levels: Advanced diffuse degenerative disc disease and
facet/uncovertebral hypertrophy, most significant in the lower
cervical spine and similar to MRI June 22, 2017.

Upper chest: Biapical pleuroparenchymal scarring.

Other: Carotid artery calcifications. Hypodense 1.3 cm left thyroid
nodule. Not clinically significant; no follow-up imaging recommended
(ref: [HOSPITAL]. [DATE]): 143-50).
IMPRESSION: 1. No evidence of acute intracranial abnormality.
2. Soft tissue swelling centered over the left paramedian frontal
bone. No evidence of underlying calvarial fracture.
3. No evidence of acute fracture or traumatic listhesis of the
cervical spine.
4. Similar advanced diffuse degenerative disc disease and
facet/uncovertebral hypertrophy of the cervical spine.

## 2022-12-18 IMAGING — CT CT CERVICAL SPINE W/O CM
3 of 4 series · 12 of 33 positions shown, 14 images · non-contrast
Comparison: MR cervical spine December 23, 2016

CLINICAL DATA: Fall with head injury

EXAM:
CT HEAD WITHOUT CONTRAST
CT CERVICAL SPINE WITHOUT CONTRAST
TECHNIQUE: Multidetector CT imaging of the head and cervical spine was
performed following the standard protocol without intravenous
contrast. Multiplanar CT image reconstructions of the cervical spine
were also generated.

[Series 5: sagittal bone · sagittal · 0.23mm/px · 5 of 61 slices shown, 6 images]
[im 21/61  bone]
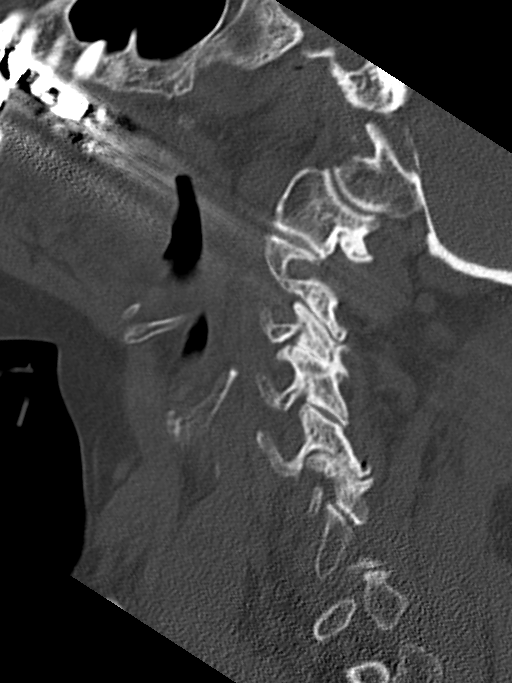
[im 26/61  bone]
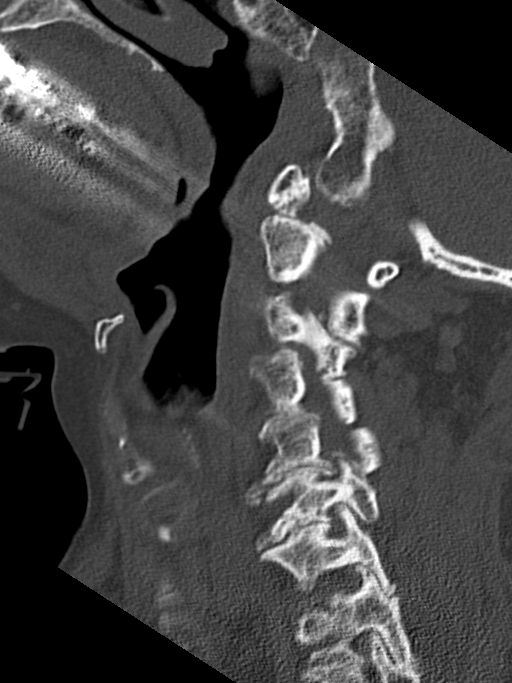
[im 31/61  soft-tissue]
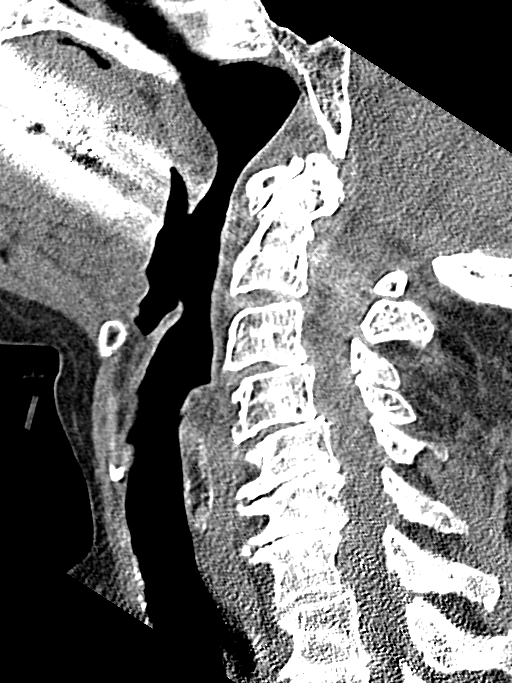
[im 31/61  bone]
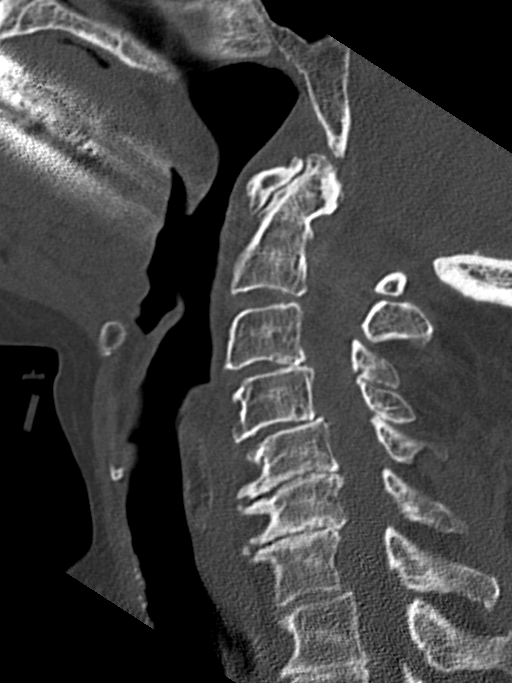
[im 36/61  bone]
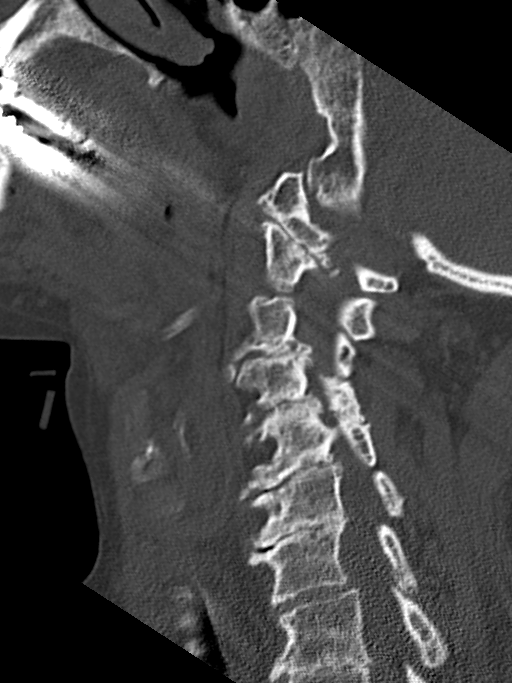
[im 41/61  bone]
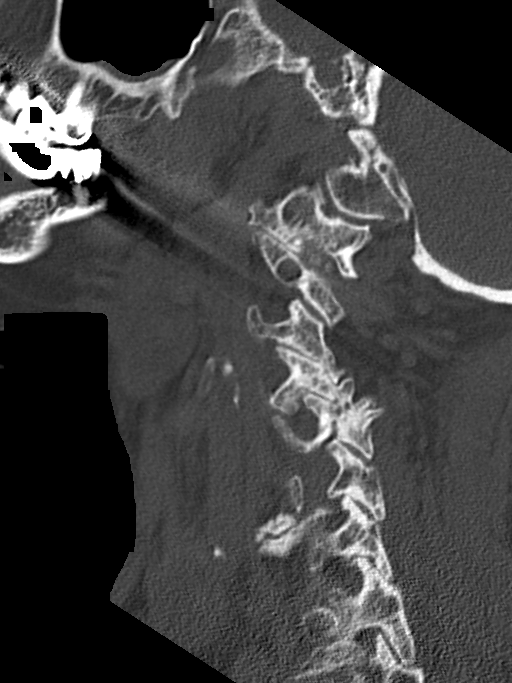

[Series 6: coronal bone · coronal · 0.23mm/px · 3 of 61 slices shown]
[im 14/61  bone]
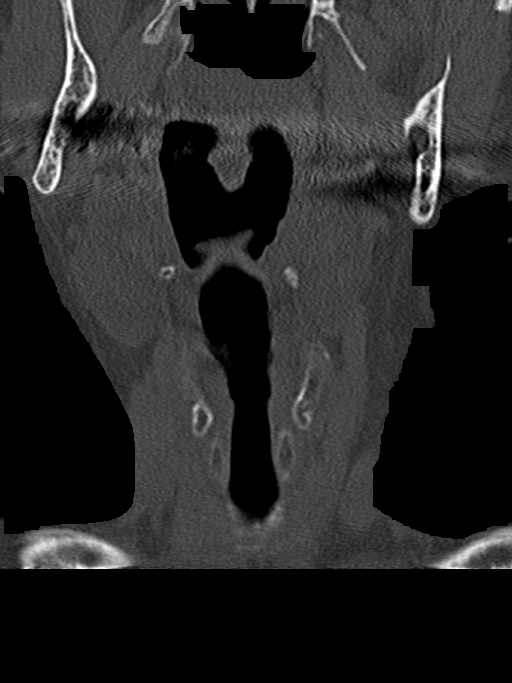
[im 25/61  bone]
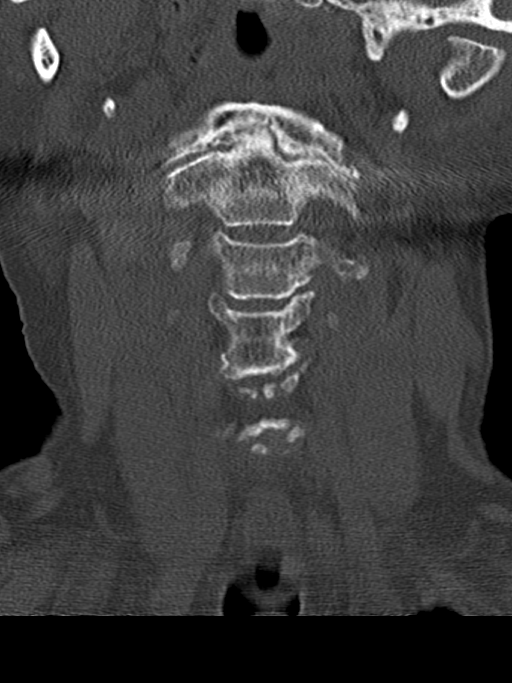
[im 36/61  bone]
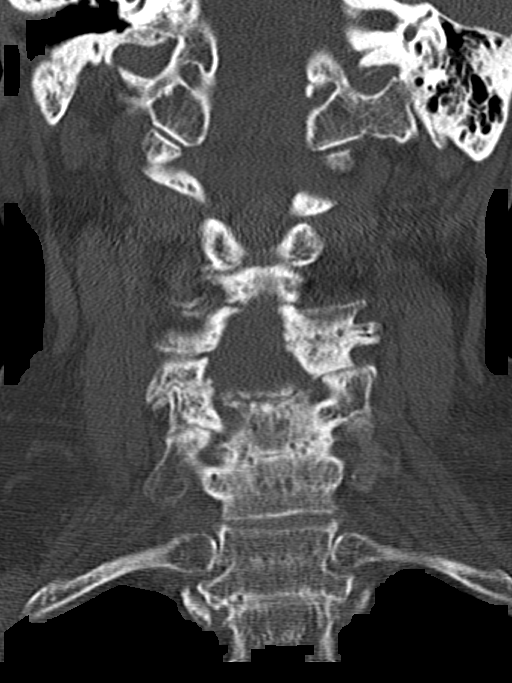

[Series 7: orthogonal axials · axial · 0.21mm/px · z∈[+1134,+1215]mm · 4 of 70 slices shown, 5 images]
[im 12/70  soft-tissue]
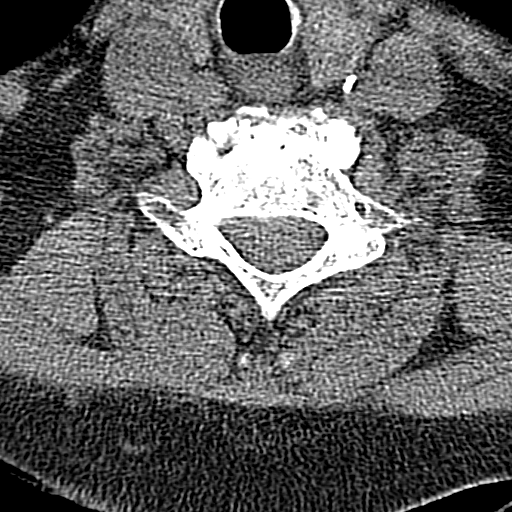
[im 12/70  bone]
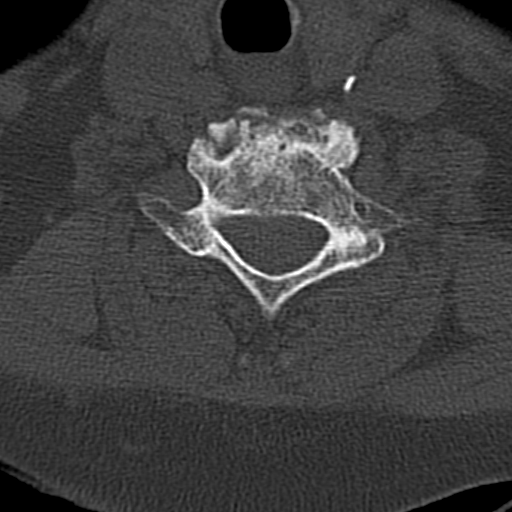
[im 24/70  bone]
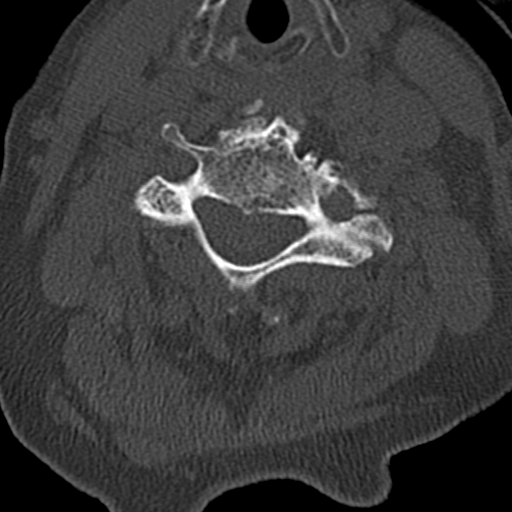
[im 47/70  bone]
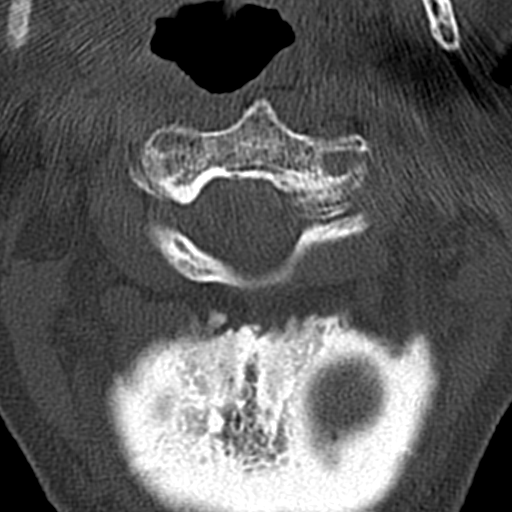
[im 58/70  bone]
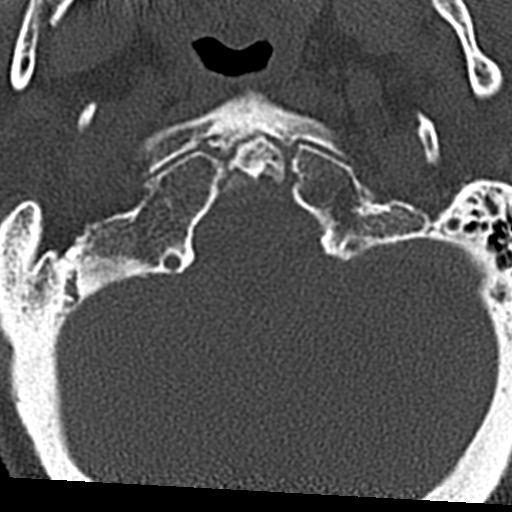

[12 of 33 positions shown; findings below may reference images not displayed]

FINDINGS: CT HEAD FINDINGS

Brain: No evidence of acute large vascular territory infarction,
hemorrhage, hydrocephalus, extra-axial collection or mass
lesion/mass effect. Mild for age global parenchymal volume loss.
Chronic lacunar type infarct in the right basal ganglia.
Mineralization of bilateral basal ganglia. White matter hypodensity
involving the paramedian right parietal lobe on image [DATE], sequela
of prior insult.

Vascular: No hyperdense vessel. Atherosclerotic calcifications of
the internal carotid and vertebral arteries at the skull base.

Skull: Soft tissue swelling centered over the left paramedian
frontal bone. No fracture or focal lesion identified.

Sinuses/Orbits: The visualized paranasal sinuses and mastoid air
cells are predominantly clear. Orbits are grossly unremarkable.

Other: Cerumen in the left external auditory canal. Dental hardware.

CT CERVICAL SPINE FINDINGS

Alignment: Straightening of the normal cervical lordosis. Stable
anterolisthesis of C3 on C4, C4 on C5 and C7 on T1. No evidence of
traumatic listhesis.

Skull base and vertebrae: No acute displaced fracture.

Soft tissues and spinal canal: No prevertebral fluid or swelling. No
visible canal hematoma.

Disc levels: Advanced diffuse degenerative disc disease and
facet/uncovertebral hypertrophy, most significant in the lower
cervical spine and similar to MRI June 22, 2017.

Upper chest: Biapical pleuroparenchymal scarring.

Other: Carotid artery calcifications. Hypodense 1.3 cm left thyroid
nodule. Not clinically significant; no follow-up imaging recommended
(ref: [HOSPITAL]. [DATE]): 143-50).
IMPRESSION: 1. No evidence of acute intracranial abnormality.
2. Soft tissue swelling centered over the left paramedian frontal
bone. No evidence of underlying calvarial fracture.
3. No evidence of acute fracture or traumatic listhesis of the
cervical spine.
4. Similar advanced diffuse degenerative disc disease and
facet/uncovertebral hypertrophy of the cervical spine.

## 2022-12-23 DIAGNOSIS — M179 Osteoarthritis of knee, unspecified: Secondary | ICD-10-CM | POA: Diagnosis not present

## 2022-12-23 DIAGNOSIS — G9519 Other vascular myelopathies: Secondary | ICD-10-CM | POA: Diagnosis not present

## 2023-01-05 DIAGNOSIS — M6281 Muscle weakness (generalized): Secondary | ICD-10-CM | POA: Diagnosis not present

## 2023-01-05 DIAGNOSIS — R262 Difficulty in walking, not elsewhere classified: Secondary | ICD-10-CM | POA: Diagnosis not present

## 2023-01-05 DIAGNOSIS — I10 Essential (primary) hypertension: Secondary | ICD-10-CM | POA: Diagnosis not present

## 2023-01-08 DIAGNOSIS — I10 Essential (primary) hypertension: Secondary | ICD-10-CM | POA: Diagnosis not present

## 2023-01-08 DIAGNOSIS — M6281 Muscle weakness (generalized): Secondary | ICD-10-CM | POA: Diagnosis not present

## 2023-01-08 DIAGNOSIS — R262 Difficulty in walking, not elsewhere classified: Secondary | ICD-10-CM | POA: Diagnosis not present

## 2023-01-12 DIAGNOSIS — K2100 Gastro-esophageal reflux disease with esophagitis, without bleeding: Secondary | ICD-10-CM | POA: Diagnosis not present

## 2023-01-12 DIAGNOSIS — R262 Difficulty in walking, not elsewhere classified: Secondary | ICD-10-CM | POA: Diagnosis not present

## 2023-01-12 DIAGNOSIS — E782 Mixed hyperlipidemia: Secondary | ICD-10-CM | POA: Diagnosis not present

## 2023-01-12 DIAGNOSIS — I6350 Cerebral infarction due to unspecified occlusion or stenosis of unspecified cerebral artery: Secondary | ICD-10-CM | POA: Diagnosis not present

## 2023-01-12 DIAGNOSIS — E559 Vitamin D deficiency, unspecified: Secondary | ICD-10-CM | POA: Diagnosis not present

## 2023-01-12 DIAGNOSIS — I10 Essential (primary) hypertension: Secondary | ICD-10-CM | POA: Diagnosis not present

## 2023-01-12 DIAGNOSIS — M6281 Muscle weakness (generalized): Secondary | ICD-10-CM | POA: Diagnosis not present

## 2023-01-12 DIAGNOSIS — D518 Other vitamin B12 deficiency anemias: Secondary | ICD-10-CM | POA: Diagnosis not present

## 2023-01-12 DIAGNOSIS — M170 Bilateral primary osteoarthritis of knee: Secondary | ICD-10-CM | POA: Diagnosis not present

## 2023-01-12 DIAGNOSIS — S80921A Unspecified superficial injury of right lower leg, initial encounter: Secondary | ICD-10-CM | POA: Diagnosis not present

## 2023-01-12 DIAGNOSIS — E876 Hypokalemia: Secondary | ICD-10-CM | POA: Diagnosis not present

## 2023-01-12 DIAGNOSIS — M48061 Spinal stenosis, lumbar region without neurogenic claudication: Secondary | ICD-10-CM | POA: Diagnosis not present

## 2023-01-15 DIAGNOSIS — R262 Difficulty in walking, not elsewhere classified: Secondary | ICD-10-CM | POA: Diagnosis not present

## 2023-01-15 DIAGNOSIS — I10 Essential (primary) hypertension: Secondary | ICD-10-CM | POA: Diagnosis not present

## 2023-01-15 DIAGNOSIS — M6281 Muscle weakness (generalized): Secondary | ICD-10-CM | POA: Diagnosis not present

## 2023-01-19 DIAGNOSIS — I10 Essential (primary) hypertension: Secondary | ICD-10-CM | POA: Diagnosis not present

## 2023-01-19 DIAGNOSIS — M6281 Muscle weakness (generalized): Secondary | ICD-10-CM | POA: Diagnosis not present

## 2023-01-19 DIAGNOSIS — R262 Difficulty in walking, not elsewhere classified: Secondary | ICD-10-CM | POA: Diagnosis not present

## 2023-01-22 DIAGNOSIS — M6281 Muscle weakness (generalized): Secondary | ICD-10-CM | POA: Diagnosis not present

## 2023-01-22 DIAGNOSIS — I10 Essential (primary) hypertension: Secondary | ICD-10-CM | POA: Diagnosis not present

## 2023-01-22 DIAGNOSIS — R262 Difficulty in walking, not elsewhere classified: Secondary | ICD-10-CM | POA: Diagnosis not present

## 2023-01-26 DIAGNOSIS — M6281 Muscle weakness (generalized): Secondary | ICD-10-CM | POA: Diagnosis not present

## 2023-01-26 DIAGNOSIS — R262 Difficulty in walking, not elsewhere classified: Secondary | ICD-10-CM | POA: Diagnosis not present

## 2023-01-26 DIAGNOSIS — I10 Essential (primary) hypertension: Secondary | ICD-10-CM | POA: Diagnosis not present

## 2023-02-01 DIAGNOSIS — M6281 Muscle weakness (generalized): Secondary | ICD-10-CM | POA: Diagnosis not present

## 2023-02-01 DIAGNOSIS — I10 Essential (primary) hypertension: Secondary | ICD-10-CM | POA: Diagnosis not present

## 2023-02-01 DIAGNOSIS — R262 Difficulty in walking, not elsewhere classified: Secondary | ICD-10-CM | POA: Diagnosis not present

## 2023-02-02 DIAGNOSIS — I10 Essential (primary) hypertension: Secondary | ICD-10-CM | POA: Diagnosis not present

## 2023-02-02 DIAGNOSIS — M6281 Muscle weakness (generalized): Secondary | ICD-10-CM | POA: Diagnosis not present

## 2023-02-02 DIAGNOSIS — R262 Difficulty in walking, not elsewhere classified: Secondary | ICD-10-CM | POA: Diagnosis not present

## 2023-02-04 DIAGNOSIS — R262 Difficulty in walking, not elsewhere classified: Secondary | ICD-10-CM | POA: Diagnosis not present

## 2023-02-04 DIAGNOSIS — I10 Essential (primary) hypertension: Secondary | ICD-10-CM | POA: Diagnosis not present

## 2023-02-04 DIAGNOSIS — M6281 Muscle weakness (generalized): Secondary | ICD-10-CM | POA: Diagnosis not present

## 2023-02-09 DIAGNOSIS — S80921A Unspecified superficial injury of right lower leg, initial encounter: Secondary | ICD-10-CM | POA: Diagnosis not present

## 2023-02-09 DIAGNOSIS — E782 Mixed hyperlipidemia: Secondary | ICD-10-CM | POA: Diagnosis not present

## 2023-02-09 DIAGNOSIS — I6350 Cerebral infarction due to unspecified occlusion or stenosis of unspecified cerebral artery: Secondary | ICD-10-CM | POA: Diagnosis not present

## 2023-02-09 DIAGNOSIS — E876 Hypokalemia: Secondary | ICD-10-CM | POA: Diagnosis not present

## 2023-02-09 DIAGNOSIS — D518 Other vitamin B12 deficiency anemias: Secondary | ICD-10-CM | POA: Diagnosis not present

## 2023-02-09 DIAGNOSIS — R262 Difficulty in walking, not elsewhere classified: Secondary | ICD-10-CM | POA: Diagnosis not present

## 2023-02-09 DIAGNOSIS — M48061 Spinal stenosis, lumbar region without neurogenic claudication: Secondary | ICD-10-CM | POA: Diagnosis not present

## 2023-02-09 DIAGNOSIS — M170 Bilateral primary osteoarthritis of knee: Secondary | ICD-10-CM | POA: Diagnosis not present

## 2023-02-09 DIAGNOSIS — K2100 Gastro-esophageal reflux disease with esophagitis, without bleeding: Secondary | ICD-10-CM | POA: Diagnosis not present

## 2023-02-09 DIAGNOSIS — E559 Vitamin D deficiency, unspecified: Secondary | ICD-10-CM | POA: Diagnosis not present

## 2023-02-09 DIAGNOSIS — I10 Essential (primary) hypertension: Secondary | ICD-10-CM | POA: Diagnosis not present

## 2023-02-09 DIAGNOSIS — M6281 Muscle weakness (generalized): Secondary | ICD-10-CM | POA: Diagnosis not present

## 2023-02-12 DIAGNOSIS — I10 Essential (primary) hypertension: Secondary | ICD-10-CM | POA: Diagnosis not present

## 2023-02-12 DIAGNOSIS — R262 Difficulty in walking, not elsewhere classified: Secondary | ICD-10-CM | POA: Diagnosis not present

## 2023-02-12 DIAGNOSIS — M6281 Muscle weakness (generalized): Secondary | ICD-10-CM | POA: Diagnosis not present

## 2023-02-16 DIAGNOSIS — I10 Essential (primary) hypertension: Secondary | ICD-10-CM | POA: Diagnosis not present

## 2023-02-16 DIAGNOSIS — M6281 Muscle weakness (generalized): Secondary | ICD-10-CM | POA: Diagnosis not present

## 2023-02-16 DIAGNOSIS — R262 Difficulty in walking, not elsewhere classified: Secondary | ICD-10-CM | POA: Diagnosis not present

## 2023-02-19 DIAGNOSIS — I10 Essential (primary) hypertension: Secondary | ICD-10-CM | POA: Diagnosis not present

## 2023-02-19 DIAGNOSIS — R262 Difficulty in walking, not elsewhere classified: Secondary | ICD-10-CM | POA: Diagnosis not present

## 2023-02-19 DIAGNOSIS — M6281 Muscle weakness (generalized): Secondary | ICD-10-CM | POA: Diagnosis not present

## 2023-02-22 DIAGNOSIS — M179 Osteoarthritis of knee, unspecified: Secondary | ICD-10-CM | POA: Diagnosis not present

## 2023-02-22 DIAGNOSIS — G894 Chronic pain syndrome: Secondary | ICD-10-CM | POA: Diagnosis not present

## 2023-02-25 DIAGNOSIS — I10 Essential (primary) hypertension: Secondary | ICD-10-CM | POA: Diagnosis not present

## 2023-02-25 DIAGNOSIS — R262 Difficulty in walking, not elsewhere classified: Secondary | ICD-10-CM | POA: Diagnosis not present

## 2023-02-25 DIAGNOSIS — M6281 Muscle weakness (generalized): Secondary | ICD-10-CM | POA: Diagnosis not present

## 2023-03-01 DIAGNOSIS — I10 Essential (primary) hypertension: Secondary | ICD-10-CM | POA: Diagnosis not present

## 2023-03-01 DIAGNOSIS — R262 Difficulty in walking, not elsewhere classified: Secondary | ICD-10-CM | POA: Diagnosis not present

## 2023-03-01 DIAGNOSIS — M6281 Muscle weakness (generalized): Secondary | ICD-10-CM | POA: Diagnosis not present

## 2023-03-03 DIAGNOSIS — I10 Essential (primary) hypertension: Secondary | ICD-10-CM | POA: Diagnosis not present

## 2023-03-03 DIAGNOSIS — M6281 Muscle weakness (generalized): Secondary | ICD-10-CM | POA: Diagnosis not present

## 2023-03-03 DIAGNOSIS — R262 Difficulty in walking, not elsewhere classified: Secondary | ICD-10-CM | POA: Diagnosis not present

## 2023-03-08 DIAGNOSIS — R262 Difficulty in walking, not elsewhere classified: Secondary | ICD-10-CM | POA: Diagnosis not present

## 2023-03-08 DIAGNOSIS — M6281 Muscle weakness (generalized): Secondary | ICD-10-CM | POA: Diagnosis not present

## 2023-03-08 DIAGNOSIS — I10 Essential (primary) hypertension: Secondary | ICD-10-CM | POA: Diagnosis not present

## 2023-03-09 DIAGNOSIS — E782 Mixed hyperlipidemia: Secondary | ICD-10-CM | POA: Diagnosis not present

## 2023-03-09 DIAGNOSIS — I10 Essential (primary) hypertension: Secondary | ICD-10-CM | POA: Diagnosis not present

## 2023-03-09 DIAGNOSIS — D518 Other vitamin B12 deficiency anemias: Secondary | ICD-10-CM | POA: Diagnosis not present

## 2023-03-09 DIAGNOSIS — M170 Bilateral primary osteoarthritis of knee: Secondary | ICD-10-CM | POA: Diagnosis not present

## 2023-03-09 DIAGNOSIS — E559 Vitamin D deficiency, unspecified: Secondary | ICD-10-CM | POA: Diagnosis not present

## 2023-03-09 DIAGNOSIS — K2100 Gastro-esophageal reflux disease with esophagitis, without bleeding: Secondary | ICD-10-CM | POA: Diagnosis not present

## 2023-03-09 DIAGNOSIS — I6350 Cerebral infarction due to unspecified occlusion or stenosis of unspecified cerebral artery: Secondary | ICD-10-CM | POA: Diagnosis not present

## 2023-03-09 DIAGNOSIS — M48061 Spinal stenosis, lumbar region without neurogenic claudication: Secondary | ICD-10-CM | POA: Diagnosis not present

## 2023-03-09 DIAGNOSIS — S80921A Unspecified superficial injury of right lower leg, initial encounter: Secondary | ICD-10-CM | POA: Diagnosis not present

## 2023-03-09 DIAGNOSIS — E876 Hypokalemia: Secondary | ICD-10-CM | POA: Diagnosis not present

## 2023-03-12 DIAGNOSIS — R262 Difficulty in walking, not elsewhere classified: Secondary | ICD-10-CM | POA: Diagnosis not present

## 2023-03-12 DIAGNOSIS — I10 Essential (primary) hypertension: Secondary | ICD-10-CM | POA: Diagnosis not present

## 2023-03-12 DIAGNOSIS — M6281 Muscle weakness (generalized): Secondary | ICD-10-CM | POA: Diagnosis not present

## 2023-03-15 DIAGNOSIS — M6281 Muscle weakness (generalized): Secondary | ICD-10-CM | POA: Diagnosis not present

## 2023-03-15 DIAGNOSIS — G894 Chronic pain syndrome: Secondary | ICD-10-CM | POA: Diagnosis not present

## 2023-03-15 DIAGNOSIS — I10 Essential (primary) hypertension: Secondary | ICD-10-CM | POA: Diagnosis not present

## 2023-03-15 DIAGNOSIS — R262 Difficulty in walking, not elsewhere classified: Secondary | ICD-10-CM | POA: Diagnosis not present

## 2023-03-19 DIAGNOSIS — R262 Difficulty in walking, not elsewhere classified: Secondary | ICD-10-CM | POA: Diagnosis not present

## 2023-03-19 DIAGNOSIS — I10 Essential (primary) hypertension: Secondary | ICD-10-CM | POA: Diagnosis not present

## 2023-03-19 DIAGNOSIS — M6281 Muscle weakness (generalized): Secondary | ICD-10-CM | POA: Diagnosis not present

## 2023-03-22 DIAGNOSIS — M6281 Muscle weakness (generalized): Secondary | ICD-10-CM | POA: Diagnosis not present

## 2023-03-22 DIAGNOSIS — R262 Difficulty in walking, not elsewhere classified: Secondary | ICD-10-CM | POA: Diagnosis not present

## 2023-03-22 DIAGNOSIS — I10 Essential (primary) hypertension: Secondary | ICD-10-CM | POA: Diagnosis not present

## 2023-03-23 DIAGNOSIS — Z8673 Personal history of transient ischemic attack (TIA), and cerebral infarction without residual deficits: Secondary | ICD-10-CM | POA: Diagnosis not present

## 2023-03-23 DIAGNOSIS — R262 Difficulty in walking, not elsewhere classified: Secondary | ICD-10-CM | POA: Diagnosis not present

## 2023-03-23 DIAGNOSIS — G894 Chronic pain syndrome: Secondary | ICD-10-CM | POA: Diagnosis not present

## 2023-03-23 DIAGNOSIS — M48061 Spinal stenosis, lumbar region without neurogenic claudication: Secondary | ICD-10-CM | POA: Diagnosis not present

## 2023-03-23 DIAGNOSIS — I110 Hypertensive heart disease with heart failure: Secondary | ICD-10-CM | POA: Diagnosis not present

## 2023-03-23 DIAGNOSIS — I10 Essential (primary) hypertension: Secondary | ICD-10-CM | POA: Diagnosis not present

## 2023-03-23 DIAGNOSIS — M6281 Muscle weakness (generalized): Secondary | ICD-10-CM | POA: Diagnosis not present

## 2023-03-25 DIAGNOSIS — I10 Essential (primary) hypertension: Secondary | ICD-10-CM | POA: Diagnosis not present

## 2023-03-25 DIAGNOSIS — M6281 Muscle weakness (generalized): Secondary | ICD-10-CM | POA: Diagnosis not present

## 2023-03-25 DIAGNOSIS — R262 Difficulty in walking, not elsewhere classified: Secondary | ICD-10-CM | POA: Diagnosis not present

## 2023-03-25 DIAGNOSIS — M48062 Spinal stenosis, lumbar region with neurogenic claudication: Secondary | ICD-10-CM | POA: Diagnosis not present

## 2023-03-25 DIAGNOSIS — G629 Polyneuropathy, unspecified: Secondary | ICD-10-CM | POA: Diagnosis not present

## 2023-03-29 DIAGNOSIS — M6281 Muscle weakness (generalized): Secondary | ICD-10-CM | POA: Diagnosis not present

## 2023-03-29 DIAGNOSIS — I10 Essential (primary) hypertension: Secondary | ICD-10-CM | POA: Diagnosis not present

## 2023-03-29 DIAGNOSIS — R262 Difficulty in walking, not elsewhere classified: Secondary | ICD-10-CM | POA: Diagnosis not present

## 2023-03-30 DIAGNOSIS — M6281 Muscle weakness (generalized): Secondary | ICD-10-CM | POA: Diagnosis not present

## 2023-03-30 DIAGNOSIS — R262 Difficulty in walking, not elsewhere classified: Secondary | ICD-10-CM | POA: Diagnosis not present

## 2023-03-30 DIAGNOSIS — I10 Essential (primary) hypertension: Secondary | ICD-10-CM | POA: Diagnosis not present

## 2023-03-31 DIAGNOSIS — G629 Polyneuropathy, unspecified: Secondary | ICD-10-CM | POA: Diagnosis not present

## 2023-03-31 DIAGNOSIS — M6281 Muscle weakness (generalized): Secondary | ICD-10-CM | POA: Diagnosis not present

## 2023-03-31 DIAGNOSIS — M48062 Spinal stenosis, lumbar region with neurogenic claudication: Secondary | ICD-10-CM | POA: Diagnosis not present

## 2023-03-31 DIAGNOSIS — I10 Essential (primary) hypertension: Secondary | ICD-10-CM | POA: Diagnosis not present

## 2023-03-31 DIAGNOSIS — R262 Difficulty in walking, not elsewhere classified: Secondary | ICD-10-CM | POA: Diagnosis not present

## 2023-04-01 ENCOUNTER — Emergency Department (HOSPITAL_COMMUNITY): Payer: Medicare Other

## 2023-04-01 ENCOUNTER — Emergency Department (HOSPITAL_COMMUNITY)
Admission: EM | Admit: 2023-04-01 | Discharge: 2023-04-01 | Disposition: A | Discharge status: Skilled Nursing Facility | Payer: Medicare Other | Attending: Student | Admitting: Student

## 2023-04-01 ENCOUNTER — Other Ambulatory Visit: Payer: Self-pay

## 2023-04-01 ENCOUNTER — Encounter (HOSPITAL_COMMUNITY): Payer: Self-pay

## 2023-04-01 DIAGNOSIS — Z7982 Long term (current) use of aspirin: Secondary | ICD-10-CM | POA: Diagnosis not present

## 2023-04-01 DIAGNOSIS — M25552 Pain in left hip: Secondary | ICD-10-CM

## 2023-04-01 DIAGNOSIS — M47812 Spondylosis without myelopathy or radiculopathy, cervical region: Secondary | ICD-10-CM | POA: Diagnosis not present

## 2023-04-01 DIAGNOSIS — R262 Difficulty in walking, not elsewhere classified: Secondary | ICD-10-CM | POA: Diagnosis not present

## 2023-04-01 DIAGNOSIS — Z79899 Other long term (current) drug therapy: Secondary | ICD-10-CM | POA: Insufficient documentation

## 2023-04-01 DIAGNOSIS — S0101XA Laceration without foreign body of scalp, initial encounter: Secondary | ICD-10-CM | POA: Insufficient documentation

## 2023-04-01 DIAGNOSIS — Z8673 Personal history of transient ischemic attack (TIA), and cerebral infarction without residual deficits: Secondary | ICD-10-CM | POA: Diagnosis not present

## 2023-04-01 DIAGNOSIS — S79912A Unspecified injury of left hip, initial encounter: Secondary | ICD-10-CM | POA: Diagnosis present

## 2023-04-01 DIAGNOSIS — R000 Tachycardia, unspecified: Secondary | ICD-10-CM | POA: Diagnosis not present

## 2023-04-01 DIAGNOSIS — I6381 Other cerebral infarction due to occlusion or stenosis of small artery: Secondary | ICD-10-CM | POA: Diagnosis not present

## 2023-04-01 DIAGNOSIS — I10 Essential (primary) hypertension: Secondary | ICD-10-CM | POA: Diagnosis not present

## 2023-04-01 DIAGNOSIS — S7002XA Contusion of left hip, initial encounter: Secondary | ICD-10-CM | POA: Insufficient documentation

## 2023-04-01 DIAGNOSIS — Z96642 Presence of left artificial hip joint: Secondary | ICD-10-CM | POA: Insufficient documentation

## 2023-04-01 DIAGNOSIS — Z87891 Personal history of nicotine dependence: Secondary | ICD-10-CM | POA: Insufficient documentation

## 2023-04-01 DIAGNOSIS — M48062 Spinal stenosis, lumbar region with neurogenic claudication: Secondary | ICD-10-CM | POA: Diagnosis not present

## 2023-04-01 DIAGNOSIS — G629 Polyneuropathy, unspecified: Secondary | ICD-10-CM | POA: Diagnosis not present

## 2023-04-01 DIAGNOSIS — W19XXXA Unspecified fall, initial encounter: Secondary | ICD-10-CM | POA: Insufficient documentation

## 2023-04-01 DIAGNOSIS — M6281 Muscle weakness (generalized): Secondary | ICD-10-CM | POA: Diagnosis not present

## 2023-04-01 DIAGNOSIS — S0990XA Unspecified injury of head, initial encounter: Secondary | ICD-10-CM | POA: Diagnosis not present

## 2023-04-01 DIAGNOSIS — Z043 Encounter for examination and observation following other accident: Secondary | ICD-10-CM | POA: Diagnosis not present

## 2023-04-01 MED ORDER — KETOROLAC TROMETHAMINE 15 MG/ML IJ SOLN
15.0000 mg | Freq: Once | INTRAMUSCULAR | Status: AC
  Administered 2023-04-01: 15 mg via INTRAVENOUS
  Filled 2023-04-01: qty 1

## 2023-04-01 MED ORDER — LACTATED RINGERS IV BOLUS
1000.0000 mL | Freq: Once | INTRAVENOUS | Status: AC
  Administered 2023-04-01: 1000 mL via INTRAVENOUS

## 2023-04-01 NOTE — ED Provider Notes (Signed)
[place] EMERGENCY DEPARTMENT AT [facility] Provider Note  CSN: [id number] Arrival date & time: 04/01/23 1124  Chief Complaint(s) Fall, Hip Pain, and Head Injury  HPI Carrie Sanders is a 89 y.o. female who presents emergency department for evaluation of a fall.  Patient currently at [facility] assisted living and states that she lost her balance due to her "bad knees" and fell on the ground, striking her head and landing on her left hip.  Patient takes aspirin only but no other blood thinning medications.  She arrives with complaints of the small headache in the back of the scalp at the site of a small hematoma but denies any hip pain here in the emergency department.  Denies numbness, tingling, weakness or other neurologic complaints.  Denies chest pain, shortness of breath, abdominal pain, nausea, vomiting or other systemic traumatic complaints.   Past Medical History Past Medical History:  Diagnosis Date   Arthritis    Constipation    Gait disorder 05/26/2016   H/O left breast biopsy    High cholesterol    Hypertension    Paresthesia 05/26/2016   Pessary maintenance    Sinus drainage    Sleeping difficulty    Patient Active Problem List   Diagnosis Date Noted   Ischemic stroke (HCC) 07/20/2022   Hypokalemia 07/19/2022   Stroke-like symptoms 07/19/2022   Stroke (HCC) 07/18/2022   HTN (hypertension) 07/18/2022   HLD (hyperlipidemia) 07/18/2022   GERD (gastroesophageal reflux disease) 07/18/2022   Spinal stenosis at L4-L5 level 07/24/2016   Paresthesia 05/26/2016   Gait disorder 05/26/2016   S/P Left THA, AA 03/17/2012   KNEE, ARTHRITIS, DEGEN./OSTEO 05/23/2009   KNEE PAIN 05/23/2009   ANSERINE BURSITIS, LEFT 05/23/2009   TRIGGER FINGER 05/23/2009   HAND PAIN 05/23/2009   HIGH BLOOD PRESSURE 05/23/2009   Home Medication(s) Prior to Admission medications   Medication Sig Start Date End Date Taking? Authorizing Provider  aspirin 81 MG chewable tablet Chew 1  tablet (81 mg total) by mouth daily. 07/23/22   Leatha Gilding, MD  bumetanide (BUMEX) 1 MG tablet Take 1 mg by mouth daily.    [provider]  cyanocobalamin (VITAMIN B12) 1000 MCG tablet Take 1 tablet (1,000 mcg total) by mouth daily. 07/22/22   Leatha Gilding, MD  losartan (COZAAR) 100 MG tablet Take 50 mg by mouth every morning.    [provider]  meloxicam (MOBIC) 7.5 MG tablet Take 7.5 mg by mouth every morning. 03/18/21   [provider]  omeprazole (PRILOSEC) 20 MG capsule Take 20 mg by mouth daily. 05/22/16   [provider]  rosuvastatin (CRESTOR) 20 MG tablet Take 20 mg by mouth daily after breakfast.    [provider]  traMADol (ULTRAM) 50 MG tablet Take 1 tablet (50 mg total) by mouth at bedtime. 07/22/22   Leatha Gilding, MD  Past Surgical History Past Surgical History:  Procedure Laterality Date   APPENDECTOMY     AGE 35   CATARACTS REMOVED     JOINT REPLACEMENT  1998   RT TOTAL HIP   TOTAL HIP ARTHROPLASTY  03/17/2012   Procedure: TOTAL HIP ARTHROPLASTY ANTERIOR APPROACH;  Surgeon: Shelda Pal, MD;  Location: [facility];  Service: Orthopedics;  Laterality: Left;   Family History Family History  Problem Relation Age of Onset   Heart disease Brother    Stroke Mother    Cerebral aneurysm Sister     Social History Social History   Tobacco Use   Smoking status: Former    Types: Cigarettes    Quit date: 03/10/1959    Years since quitting: 64.1   Smokeless tobacco: Never  Vaping Use   Vaping Use: Never used  Substance Use Topics   Alcohol use: No   Drug use: No   Allergies Codeine and Sulfonamide derivatives  Review of Systems Review of Systems  Neurological:  Positive for headaches.  All other systems reviewed and are negative.   Physical Exam Vital Signs  I have reviewed the  triage vital signs BP 138/85   Pulse 99   Temp 98 F (36.7 C) (Oral)   Resp 16   Ht 5\' 1"  (1.549 m)   Wt 54 kg   SpO2 96%   BMI 22.48 kg/m   Physical Exam Vitals and nursing note reviewed.  Constitutional:      General: She is not in acute distress.    Appearance: She is well-developed.  HENT:     Head: Normocephalic.     Comments: Posterior scalp hematoma Eyes:     Conjunctiva/sclera: Conjunctivae normal.  Cardiovascular:     Rate and Rhythm: Normal rate and regular rhythm.     Heart sounds: No murmur heard. Pulmonary:     Effort: Pulmonary effort is normal. No respiratory distress.     Breath sounds: Normal breath sounds.  Abdominal:     Palpations: Abdomen is soft.     Tenderness: There is no abdominal tenderness.  Musculoskeletal:     Cervical back: Neck supple.  Skin:    General: Skin is warm and dry.     Capillary Refill: Capillary refill takes less than 2 seconds.  Neurological:     Mental Status: She is alert.  Psychiatric:        Mood and Affect: Mood normal.     ED Results and Treatments Labs (all labs ordered are listed, but only abnormal results are displayed) Labs Reviewed - No data to display                                                                                                                        Radiology DG Hip Unilat W or Wo Pelvis 2-3 Views Left  Result Date: 04/01/2023 CLINICAL DATA:  Pain after fall EXAM: DG HIP (WITH OR WITHOUT PELVIS) 3V LEFT COMPARISON:  None Available. FINDINGS: Osteopenia.  Bilateral hip arthroplasties are identified with screw fixated acetabular cups and Press-Fit femoral components. The right femoral stem is not completely included in the imaging field. The left is included. No hardware failure. No fracture. Degenerative changes of the sacroiliac joints. Osteopenia. Hyperostosis. Advanced degenerative changes of the visualized lumbar spine. Scattered vascular calcifications. Presumed left-sided injection  granulomas. IMPRESSION: Bilateral hip arthroplasties.  Osteopenia with degenerative changes. Electronically Signed   By: Karen Kays M.D.   On: 04/01/2023 13:25   CT HEAD WO CONTRAST ( )  Result Date: 04/01/2023 CLINICAL DATA:  Head trauma, minor (Age >= 65y); Neck trauma (Age >= 65y). EXAM: CT HEAD WITHOUT CONTRAST CT CERVICAL SPINE WITHOUT CONTRAST TECHNIQUE: Multidetector CT imaging of the head and cervical spine was performed following the standard protocol without intravenous contrast. Multiplanar CT image reconstructions of the cervical spine were also generated. RADIATION DOSE REDUCTION: This exam was performed according to the departmental dose-optimization program which includes automated exposure control, adjustment of the mA and/or kV according to patient size and/or use of iterative reconstruction technique. COMPARISON:  Head CT 03/18/2022. MRI brain 03/20/2022. CT cervical spine 05/04/2021. FINDINGS: CT HEAD FINDINGS Brain: No acute intracranial hemorrhage. Unchanged old lacunar infarcts in the right corona radiata and left cerebellar hemisphere. Unchanged mild chronic small-vessel disease. No hydrocephalus or extra-axial collection. No mass effect or midline shift. Vascular: No hyperdense vessel or unexpected calcification. Skull: No calvarial fracture or suspicious bone lesion. Skull base is unremarkable. Sinuses/Orbits: Unremarkable. Other: None. CT CERVICAL SPINE FINDINGS Alignment: Unchanged 3 mm degenerative anterolisthesis of C3 on C4 and C4 on C5. No traumatic malalignment. Skull base and vertebrae: No acute fracture. Intact craniocervical junction. Soft tissues and spinal canal: No prevertebral fluid or swelling. No visible canal hematoma. Disc levels: Multilevel cervical spondylosis, worst at C4-5, where there is at least moderate spinal canal stenosis. Upper chest: Unremarkable. Other: Atherosclerotic calcifications of the carotid bulbs. IMPRESSION: 1. No acute intracranial abnormality.  Unchanged old lacunar infarcts in the right corona radiata and left cerebellar hemisphere. 2. No acute cervical spine fracture. Multilevel cervical spondylosis, worst at C4-5 where there is at least moderate spinal canal stenosis. Electronically Signed   By: Orvan Falconer M.D.   On: 04/01/2023 12:25   CT Cervical Spine Wo Contrast  Result Date: 04/01/2023 CLINICAL DATA:  Head trauma, minor (Age >= 65y); Neck trauma (Age >= 65y). EXAM: CT HEAD WITHOUT CONTRAST CT CERVICAL SPINE WITHOUT CONTRAST TECHNIQUE: Multidetector CT imaging of the head and cervical spine was performed following the standard protocol without intravenous contrast. Multiplanar CT image reconstructions of the cervical spine were also generated. RADIATION DOSE REDUCTION: This exam was performed according to the departmental dose-optimization program which includes automated exposure control, adjustment of the mA and/or kV according to patient size and/or use of iterative reconstruction technique. COMPARISON:  Head CT 03/18/2022. MRI brain 03/20/2022. CT cervical spine 05/04/2021. FINDINGS: CT HEAD FINDINGS Brain: No acute intracranial hemorrhage. Unchanged old lacunar infarcts in the right corona radiata and left cerebellar hemisphere. Unchanged mild chronic small-vessel disease. No hydrocephalus or extra-axial collection. No mass effect or midline shift. Vascular: No hyperdense vessel or unexpected calcification. Skull: No calvarial fracture or suspicious bone lesion. Skull base is unremarkable. Sinuses/Orbits: Unremarkable. Other: None. CT CERVICAL SPINE FINDINGS Alignment: Unchanged 3 mm degenerative anterolisthesis of C3 on C4 and C4 on C5. No traumatic malalignment. Skull base and vertebrae: No acute fracture. Intact craniocervical junction. Soft tissues and spinal canal: No prevertebral fluid or swelling. No visible canal hematoma. Disc levels:  Multilevel cervical spondylosis, worst at C4-5, where there is at least moderate spinal canal  stenosis. Upper chest: Unremarkable. Other: Atherosclerotic calcifications of the carotid bulbs. IMPRESSION: 1. No acute intracranial abnormality. Unchanged old lacunar infarcts in the right corona radiata and left cerebellar hemisphere. 2. No acute cervical spine fracture. Multilevel cervical spondylosis, worst at C4-5 where there is at least moderate spinal canal stenosis. Electronically Signed   By: Orvan Falconer M.D.   On: 04/01/2023 12:25   DG Chest Portable 1 View  Result Date: 04/01/2023 CLINICAL DATA:  Fall EXAM: PORTABLE CHEST 1 VIEW COMPARISON:  None Available. FINDINGS: No pleural effusion. No pneumothorax. No focal airspace opacity. Normal cardiac and mediastinal contours. No radiographically apparent displaced rib fractures. Visualized upper abdomen is unremarkable. IMPRESSION: No focal airspace opacity. Electronically Signed   By: Lorenza Cambridge M.D.   On: 04/01/2023 12:15    Pertinent labs & imaging results that were available during my care of the patient were reviewed by me and considered in my medical decision making (see MDM for details).  Medications Ordered in ED Medications  ketorolac (TORADOL) 15 MG/ML injection 15 mg (15 mg Intravenous Given 04/01/23 1329)  lactated ringers bolus 1,000 mL (0 mLs Intravenous Stopped 04/01/23 1522)                                                                                                                                     Procedures Procedures  (including critical care time)  Medical Decision Making / ED Course   This patient presents to the ED for concern of fall, hip pain, wrist pain, this involves an extensive number of treatment options, and is a complaint that carries with it a high risk of complications and morbidity.  The differential diagnosis includes fracture, ligamentous injury, dislocation, hematoma, closed head injury, ICH, SDH, contusion  MDM: Patient seen emergency room for evaluation of a fall.  Physical exam with a  small posterior scalp hematoma but is otherwise unremarkable.  Minimal appreciable tenderness at the hip.  Trauma imaging including left hip x-ray, chest x-ray, CT head and C-spine without evidence of acute traumatic injury.  We attempted to perform an ambulatory trial and initially, patient had some worsening pain in the pelvis and was unable to complete this.  She received a single dose of Toradol and on second attempt with pain controlled patient able to ambulate without difficulty.  With negative trauma workup, patient safe for discharge with outpatient follow-up   Additional history obtained:  -External records from outside source obtained and reviewed including: Chart review including previous notes, labs, imaging, consultation note    Imaging Studies ordered: I ordered imaging studies including CT head, C-spine, chest x-ray, hip x-ray I independently visualized and interpreted imaging. I agree with the radiologist interpretation   Medicines ordered and prescription drug management: Meds ordered this encounter  Medications   ketorolac (TORADOL) 15 MG/ML injection 15 mg  lactated ringers bolus 1,000 mL    -I have reviewed the patients home medicines and have made adjustments as needed  Critical interventions none    Cardiac Monitoring: The patient was maintained on a cardiac monitor.  I personally viewed and interpreted the cardiac monitored which showed an underlying rhythm of: NSR  Social Determinants of Health:  Factors impacting patients care include: none   Reevaluation: After the interventions noted above, I reevaluated the patient and found that they have :improved  Co morbidities that complicate the patient evaluation  Past Medical History:  Diagnosis Date   Arthritis    Constipation    Gait disorder 05/26/2016   H/O left breast biopsy    High cholesterol    Hypertension    Paresthesia 05/26/2016   Pessary maintenance    Sinus drainage    Sleeping  difficulty       Dispostion: I considered admission for this patient, but at this time patient does not meet inpatient criteria for admission and safe for discharge and outpatient follow-up     Final Clinical Impression(s) / ED Diagnoses Final diagnoses:  Fall, initial encounter  Left hip pain  Contusion of left hip, initial encounter     @PCDICTATION @    Glendora Score, MD 04/01/23 1858

## 2023-04-01 NOTE — ED Triage Notes (Signed)
Per EMS, Pt, from [facility], c/o hematoma to posterior head and L hip pain r/t a fall this morning.  Pt reports losing her balance d/t her "bad knees" and falling.  Pt takes 81mg  Aspirin daily.  No shortening or rotation noted.  Hx of bilateral hip surgery.

## 2023-04-01 NOTE — ED Notes (Addendum)
Patient transported to CT/Xray. 

## 2023-04-02 DIAGNOSIS — I10 Essential (primary) hypertension: Secondary | ICD-10-CM | POA: Diagnosis not present

## 2023-04-02 DIAGNOSIS — M6281 Muscle weakness (generalized): Secondary | ICD-10-CM | POA: Diagnosis not present

## 2023-04-02 DIAGNOSIS — R262 Difficulty in walking, not elsewhere classified: Secondary | ICD-10-CM | POA: Diagnosis not present

## 2023-04-05 DIAGNOSIS — I110 Hypertensive heart disease with heart failure: Secondary | ICD-10-CM | POA: Diagnosis not present

## 2023-04-05 DIAGNOSIS — I509 Heart failure, unspecified: Secondary | ICD-10-CM | POA: Diagnosis not present

## 2023-04-05 DIAGNOSIS — K5904 Chronic idiopathic constipation: Secondary | ICD-10-CM | POA: Diagnosis not present

## 2023-04-05 DIAGNOSIS — K219 Gastro-esophageal reflux disease without esophagitis: Secondary | ICD-10-CM | POA: Diagnosis not present

## 2023-04-06 DIAGNOSIS — M48062 Spinal stenosis, lumbar region with neurogenic claudication: Secondary | ICD-10-CM | POA: Diagnosis not present

## 2023-04-06 DIAGNOSIS — M6281 Muscle weakness (generalized): Secondary | ICD-10-CM | POA: Diagnosis not present

## 2023-04-06 DIAGNOSIS — I10 Essential (primary) hypertension: Secondary | ICD-10-CM | POA: Diagnosis not present

## 2023-04-06 DIAGNOSIS — R262 Difficulty in walking, not elsewhere classified: Secondary | ICD-10-CM | POA: Diagnosis not present

## 2023-04-06 DIAGNOSIS — G629 Polyneuropathy, unspecified: Secondary | ICD-10-CM | POA: Diagnosis not present

## 2023-04-07 DIAGNOSIS — R262 Difficulty in walking, not elsewhere classified: Secondary | ICD-10-CM | POA: Diagnosis not present

## 2023-04-07 DIAGNOSIS — M6281 Muscle weakness (generalized): Secondary | ICD-10-CM | POA: Diagnosis not present

## 2023-04-07 DIAGNOSIS — I10 Essential (primary) hypertension: Secondary | ICD-10-CM | POA: Diagnosis not present

## 2023-04-08 DIAGNOSIS — M48062 Spinal stenosis, lumbar region with neurogenic claudication: Secondary | ICD-10-CM | POA: Diagnosis not present

## 2023-04-08 DIAGNOSIS — G629 Polyneuropathy, unspecified: Secondary | ICD-10-CM | POA: Diagnosis not present

## 2023-04-08 DIAGNOSIS — I10 Essential (primary) hypertension: Secondary | ICD-10-CM | POA: Diagnosis not present

## 2023-04-08 DIAGNOSIS — M6281 Muscle weakness (generalized): Secondary | ICD-10-CM | POA: Diagnosis not present

## 2023-04-08 DIAGNOSIS — R262 Difficulty in walking, not elsewhere classified: Secondary | ICD-10-CM | POA: Diagnosis not present

## 2023-04-09 DIAGNOSIS — R262 Difficulty in walking, not elsewhere classified: Secondary | ICD-10-CM | POA: Diagnosis not present

## 2023-04-09 DIAGNOSIS — I10 Essential (primary) hypertension: Secondary | ICD-10-CM | POA: Diagnosis not present

## 2023-04-09 DIAGNOSIS — M6281 Muscle weakness (generalized): Secondary | ICD-10-CM | POA: Diagnosis not present

## 2023-04-13 DIAGNOSIS — I10 Essential (primary) hypertension: Secondary | ICD-10-CM | POA: Diagnosis not present

## 2023-04-13 DIAGNOSIS — M48062 Spinal stenosis, lumbar region with neurogenic claudication: Secondary | ICD-10-CM | POA: Diagnosis not present

## 2023-04-13 DIAGNOSIS — M6281 Muscle weakness (generalized): Secondary | ICD-10-CM | POA: Diagnosis not present

## 2023-04-13 DIAGNOSIS — G629 Polyneuropathy, unspecified: Secondary | ICD-10-CM | POA: Diagnosis not present

## 2023-04-13 DIAGNOSIS — R262 Difficulty in walking, not elsewhere classified: Secondary | ICD-10-CM | POA: Diagnosis not present

## 2023-04-15 DIAGNOSIS — G629 Polyneuropathy, unspecified: Secondary | ICD-10-CM | POA: Diagnosis not present

## 2023-04-15 DIAGNOSIS — I10 Essential (primary) hypertension: Secondary | ICD-10-CM | POA: Diagnosis not present

## 2023-04-15 DIAGNOSIS — R262 Difficulty in walking, not elsewhere classified: Secondary | ICD-10-CM | POA: Diagnosis not present

## 2023-04-15 DIAGNOSIS — M6281 Muscle weakness (generalized): Secondary | ICD-10-CM | POA: Diagnosis not present

## 2023-04-15 DIAGNOSIS — M48062 Spinal stenosis, lumbar region with neurogenic claudication: Secondary | ICD-10-CM | POA: Diagnosis not present

## 2023-04-16 DIAGNOSIS — I10 Essential (primary) hypertension: Secondary | ICD-10-CM | POA: Diagnosis not present

## 2023-04-16 DIAGNOSIS — M6281 Muscle weakness (generalized): Secondary | ICD-10-CM | POA: Diagnosis not present

## 2023-04-16 DIAGNOSIS — R262 Difficulty in walking, not elsewhere classified: Secondary | ICD-10-CM | POA: Diagnosis not present

## 2023-04-18 ENCOUNTER — Emergency Department (HOSPITAL_COMMUNITY)
Admission: EM | Admit: 2023-04-18 | Discharge: 2023-04-18 | Disposition: A | Discharge status: Nursing Home (Includes ICF) | Payer: Medicare Other | Attending: Emergency Medicine | Admitting: Emergency Medicine

## 2023-04-18 ENCOUNTER — Other Ambulatory Visit: Payer: Self-pay

## 2023-04-18 DIAGNOSIS — Z79899 Other long term (current) drug therapy: Secondary | ICD-10-CM | POA: Insufficient documentation

## 2023-04-18 DIAGNOSIS — I10 Essential (primary) hypertension: Secondary | ICD-10-CM | POA: Insufficient documentation

## 2023-04-18 DIAGNOSIS — Z7982 Long term (current) use of aspirin: Secondary | ICD-10-CM | POA: Insufficient documentation

## 2023-04-18 DIAGNOSIS — R7989 Other specified abnormal findings of blood chemistry: Secondary | ICD-10-CM

## 2023-04-18 DIAGNOSIS — R300 Dysuria: Secondary | ICD-10-CM | POA: Diagnosis not present

## 2023-04-18 DIAGNOSIS — M7989 Other specified soft tissue disorders: Secondary | ICD-10-CM | POA: Diagnosis not present

## 2023-04-18 DIAGNOSIS — Z8673 Personal history of transient ischemic attack (TIA), and cerebral infarction without residual deficits: Secondary | ICD-10-CM | POA: Insufficient documentation

## 2023-04-18 DIAGNOSIS — R531 Weakness: Secondary | ICD-10-CM

## 2023-04-18 LAB — BASIC METABOLIC PANEL
Anion gap: 11 (ref 5–15)
BUN: 27 mg/dL — ABNORMAL HIGH (ref 8–23)
CO2: 21 mmol/L — ABNORMAL LOW (ref 22–32)
Calcium: 9.2 mg/dL (ref 8.9–10.3)
Chloride: 105 mmol/L (ref 98–111)
Creatinine, Ser: 1.31 mg/dL — ABNORMAL HIGH (ref 0.44–1.00)
GFR, Estimated: 38 mL/min — ABNORMAL LOW (ref >60)
Glucose, Bld: 83 mg/dL (ref 70–99)
Potassium: 4 mmol/L (ref 3.5–5.1)
Sodium: 137 mmol/L (ref 135–145)

## 2023-04-18 LAB — CBC
HCT: 37.1 % (ref 36.0–46.0)
Hemoglobin: 12.1 g/dL (ref 12.0–15.0)
MCH: 32.2 pg (ref 26.0–34.0)
MCHC: 32.6 g/dL (ref 30.0–36.0)
MCV: 98.7 fL (ref 80.0–100.0)
Platelets: 151 10*3/uL (ref 150–400)
RBC: 3.76 MIL/uL — ABNORMAL LOW (ref 3.87–5.11)
RDW: 14 % (ref 11.5–15.5)
WBC: 4.9 10*3/uL (ref 4.0–10.5)
nRBC: 0 % (ref 0.0–0.2)

## 2023-04-18 LAB — BRAIN NATRIURETIC PEPTIDE: B Natriuretic Peptide: 246 pg/mL — ABNORMAL HIGH (ref 0.0–100.0)

## 2023-04-18 MED ORDER — CEPHALEXIN 500 MG PO CAPS
1000.0000 mg | ORAL_CAPSULE | Freq: Once | ORAL | Status: AC
  Administered 2023-04-18: 1000 mg via ORAL
  Filled 2023-04-18: qty 2

## 2023-04-18 MED ORDER — SODIUM CHLORIDE 0.9 % IV BOLUS
250.0000 mL | Freq: Once | INTRAVENOUS | Status: AC
  Administered 2023-04-18: 250 mL via INTRAVENOUS

## 2023-04-18 MED ORDER — CEPHALEXIN 500 MG PO CAPS: 500.0000 mg | ORAL_CAPSULE | Freq: Four times a day (QID) | ORAL | 0 refills | Status: DC

## 2023-04-18 NOTE — ED Triage Notes (Signed)
Pt arrived [facility] from [facility] per request of her daughter . Pt daughter believes pt has a UTI due to having weakness, nausea, diarrhea, painful urination since yesterday.

## 2023-04-18 NOTE — ED Notes (Signed)
Notified [facility] of patient needing transportation back to [facility] [place] [facility].

## 2023-04-18 NOTE — ED Notes (Signed)
Report called to Tammy at [facility]. They will be able to assist with getting pt back to her room upon her arrival there. Family is aware.

## 2023-04-18 NOTE — Discharge Instructions (Addendum)
Patient with elevated BNP suggesting some return of her heart failure, she is swelling of her feet as well.  I would recommend that she return to taking her full dose Bumex instead of the half dose that she had been changed to.  She should begin taking the antibiotic for UTI that I prescribed.  If symptoms not improved despite treatment please return to the emergency department for further evaluation and management.

## 2023-04-18 NOTE — ED Provider Notes (Signed)
[place] EMERGENCY DEPARTMENT AT [facility] Provider Note   CSN: [id number] Arrival date & time: 04/18/23  1542     History  Chief Complaint  Patient presents with   Weakness    Carrie Sanders is a 89 y.o. female with past medical history significant for hypertension, spinal stenosis, hyperlipidemia, previous stroke who is not currently anticoagulated presents with concern for generalized weakness which is not unilateral, some urinary incontinence, and burning with urination for the last 2 to 3 days.  Daughter was suspicious that she may have a UTI, reports that they could not get into see her normal doctor.  Patient also reports that she has been having some swelling to the tops of the feet.  She denies any chest pain, shortness of breath.  Patient thinks that it may be because she was taking Bumex and was told to discontinue this medication by her doctor.  HPI     Home Medications Prior to Admission medications   Medication Sig Start Date End Date Taking? Authorizing Provider  aspirin 81 MG chewable tablet Chew 1 tablet (81 mg total) by mouth daily. 07/23/22  Yes Gherghe, Daylene Katayama, MD  bumetanide (BUMEX) 1 MG tablet Take 1 mg by mouth See admin instructions. Take 1/2 tablet every day   Yes [provider]  cephALEXin (KEFLEX) 500 MG capsule Take 1 capsule (500 mg total) by mouth 4 (four) times daily. 04/18/23  Yes Bessie Boyte H, PA-C  Cholecalciferol (VITAMIN D3) 50 MCG (2000 UT) TABS Take 1 tablet by mouth daily.   Yes [provider]  loperamide (IMODIUM) 2 MG capsule Take 4 mg by mouth every 6 (six) hours. 01/26/23  Yes [provider]  losartan (COZAAR) 100 MG tablet Take 50 mg by mouth every morning.   Yes [provider]  meloxicam (MOBIC) 7.5 MG tablet Take 7.5 mg by mouth every morning. 03/18/21  Yes [provider]  pantoprazole (PROTONIX) 40 MG tablet Take 40 mg by mouth daily.   Yes [provider]   rosuvastatin (CRESTOR) 20 MG tablet Take 20 mg by mouth at bedtime.   Yes [provider]  traMADol (ULTRAM) 50 MG tablet Take 1 tablet (50 mg total) by mouth at bedtime. 07/22/22  Yes Leatha Gilding, MD      Allergies    Codeine and Sulfonamide derivatives    Review of Systems   Review of Systems  All other systems reviewed and are negative.   Physical Exam Updated Vital Signs BP (!) 134/97 (BP Location: Left Arm)   Pulse (!) 115   Temp 98.2 F (36.8 C) (Oral)   Resp 16   Ht 5\' 1"  (1.549 m)   Wt 54 kg   SpO2 94%   BMI 22.48 kg/m  Physical Exam Vitals and nursing note reviewed.  Constitutional:      General: She is not in acute distress.    Appearance: Normal appearance.  HENT:     Head: Normocephalic and atraumatic.  Eyes:     General:        Right eye: No discharge.        Left eye: No discharge.  Cardiovascular:     Rate and Rhythm: Normal rate and regular rhythm.     Heart sounds: No murmur heard.    No friction rub. No gallop.  Pulmonary:     Effort: Pulmonary effort is normal.     Breath sounds: Normal breath sounds.  Abdominal:  General: Bowel sounds are normal.     Palpations: Abdomen is soft.     Comments: No tenderness to palpation suprapubically, mild left flank tenderness.  No rebound, rigidity, guarding throughout.  Skin:    General: Skin is warm and dry.     Capillary Refill: Capillary refill takes less than 2 seconds.  Neurological:     Mental Status: She is alert and oriented to person, place, and time.  Psychiatric:        Mood and Affect: Mood normal.        Behavior: Behavior normal.     ED Results / Procedures / Treatments   Labs (all labs ordered are listed, but only abnormal results are displayed) Labs Reviewed  CBC - Abnormal; Notable for the following components:      Result Value   RBC 3.76 (*)    All other components within normal limits  BASIC METABOLIC PANEL - Abnormal; Notable for the following components:    CO2 21 (*)    BUN 27 (*)    Creatinine, Ser 1.31 (*)    GFR, Estimated 38 (*)    All other components within normal limits  BRAIN NATRIURETIC PEPTIDE - Abnormal; Notable for the following components:   B Natriuretic Peptide 246.0 (*)    All other components within normal limits  URINALYSIS, ROUTINE W REFLEX MICROSCOPIC    EKG None  Radiology No results found.  Procedures Procedures    Medications Ordered in ED Medications  cephALEXin (KEFLEX) capsule 1,000 mg (has no administration in time range)  sodium chloride 0.9 % bolus 250 mL (0 mLs Intravenous Stopped 04/18/23 1914)    ED Course/ Medical Decision Making/ A&P                             Medical Decision Making Amount and/or Complexity of Data Reviewed Labs: ordered.  Risk Prescription drug management.   This patient is a 89 y.o. female  who presents to the ED for concern of generalized weakness, dysuria, loose stool.   Differential diagnoses prior to evaluation: The emergent differential diagnosis includes, but is not limited to,  CVA, spinal cord injury, ACS, arrhythmia, syncope, orthostatic hypotension, sepsis, hypoglycemia, hypoxia, electrolyte disturbance, endocrine disorder, anemia, environmental exposure, polypharmacy, UTI, or other infection, infectious diarrhea, patient also having some swelling of her feet, considered CHF exacerbation, kidney failure, versus other. This is not an exhaustive differential.   Past Medical History / Co-morbidities: Hypertension, advanced age, hyperlipidemia, previous stroke  Additional history: Chart reviewed. Pertinent results include: Reviewed lab work, imaging from recent previous emergency department visits  Physical Exam: Physical exam performed. The pertinent findings include: Patient is overall in no acute distress, she did have some brief tachycardia, as well as mildly elevated blood pressure in the emergency department.  She is stable oxygen saturation on room  air.  She is normal respiratory rate.  She is overall well-appearing, she seems at her baseline mental status.  Lab Tests/Imaging studies: I personally interpreted labs/imaging and the pertinent results include: BMP notable for elevated BUN, creatinine, however fairly stable compared to patient's baseline.  Her CBC is overall unremarkable, notably with no leukocytosis.  Her BNP is mildly elevated at 246, I do not think that she needs IV diuresis in the emergency department but she can resume her full dose of Bumex at her care home..  She has been unable to provide a urine sample for [place], given her reported  history of dysuria I think is reasonable to treat her presumptively for urinary tract infection.  If symptoms not improve despite treatment I recommend return for formal urinalysis and urine culture.    Medications: I ordered medication including small fluid bolus to encourage urination, Keflex for UTI.  I have reviewed the patients home medicines and have made adjustments as needed.   Disposition: After consideration of the diagnostic results and the patients response to treatment, I feel that patient is stable for discharge at this time, discharged on Keflex, encouraged to return to her full dose Bumex, encouraged follow-up with her PCP  emergency department workup does not suggest an emergent condition requiring admission or immediate intervention beyond what has been performed at this time. The plan is: as above. The patient is safe for discharge and has been instructed to return immediately for worsening symptoms, change in symptoms or any other concerns.  Final Clinical Impression(s) / ED Diagnoses Final diagnoses:  Weakness  Elevated brain natriuretic peptide (BNP) level  Dysuria    Rx / DC Orders ED Discharge Orders          Ordered    cephALEXin (KEFLEX) 500 MG capsule  4 times daily        04/18/23 1953              Chyann Ambrocio, Edyth Gunnels 04/18/23 1956     Eber Hong, MD 04/18/23 [phone]

## 2023-04-19 DIAGNOSIS — R300 Dysuria: Secondary | ICD-10-CM | POA: Diagnosis not present

## 2023-04-19 DIAGNOSIS — I5032 Chronic diastolic (congestive) heart failure: Secondary | ICD-10-CM | POA: Diagnosis not present

## 2023-04-19 DIAGNOSIS — G894 Chronic pain syndrome: Secondary | ICD-10-CM | POA: Diagnosis not present

## 2023-04-19 DIAGNOSIS — N390 Urinary tract infection, site not specified: Secondary | ICD-10-CM | POA: Diagnosis not present

## 2023-04-20 DIAGNOSIS — G629 Polyneuropathy, unspecified: Secondary | ICD-10-CM | POA: Diagnosis not present

## 2023-04-20 DIAGNOSIS — M6281 Muscle weakness (generalized): Secondary | ICD-10-CM | POA: Diagnosis not present

## 2023-04-20 DIAGNOSIS — I10 Essential (primary) hypertension: Secondary | ICD-10-CM | POA: Diagnosis not present

## 2023-04-20 DIAGNOSIS — M48062 Spinal stenosis, lumbar region with neurogenic claudication: Secondary | ICD-10-CM | POA: Diagnosis not present

## 2023-04-20 DIAGNOSIS — R262 Difficulty in walking, not elsewhere classified: Secondary | ICD-10-CM | POA: Diagnosis not present

## 2023-04-22 DIAGNOSIS — G629 Polyneuropathy, unspecified: Secondary | ICD-10-CM | POA: Diagnosis not present

## 2023-04-22 DIAGNOSIS — M48062 Spinal stenosis, lumbar region with neurogenic claudication: Secondary | ICD-10-CM | POA: Diagnosis not present

## 2023-04-22 DIAGNOSIS — I10 Essential (primary) hypertension: Secondary | ICD-10-CM | POA: Diagnosis not present

## 2023-04-22 DIAGNOSIS — M6281 Muscle weakness (generalized): Secondary | ICD-10-CM | POA: Diagnosis not present

## 2023-04-22 DIAGNOSIS — R262 Difficulty in walking, not elsewhere classified: Secondary | ICD-10-CM | POA: Diagnosis not present

## 2023-04-23 DIAGNOSIS — R262 Difficulty in walking, not elsewhere classified: Secondary | ICD-10-CM | POA: Diagnosis not present

## 2023-04-23 DIAGNOSIS — I10 Essential (primary) hypertension: Secondary | ICD-10-CM | POA: Diagnosis not present

## 2023-04-23 DIAGNOSIS — M6281 Muscle weakness (generalized): Secondary | ICD-10-CM | POA: Diagnosis not present

## 2023-04-24 DIAGNOSIS — M6281 Muscle weakness (generalized): Secondary | ICD-10-CM | POA: Diagnosis not present

## 2023-04-24 DIAGNOSIS — I10 Essential (primary) hypertension: Secondary | ICD-10-CM | POA: Diagnosis not present

## 2023-04-24 DIAGNOSIS — R262 Difficulty in walking, not elsewhere classified: Secondary | ICD-10-CM | POA: Diagnosis not present

## 2023-04-26 ENCOUNTER — Emergency Department (HOSPITAL_COMMUNITY): Payer: Medicare Other

## 2023-04-26 ENCOUNTER — Observation Stay (HOSPITAL_COMMUNITY)
Admission: EM | Admit: 2023-04-26 | Discharge: 2023-04-27 | Disposition: A | Discharge status: Skilled Nursing Facility | Payer: Medicare Other | Attending: Internal Medicine | Admitting: Internal Medicine

## 2023-04-26 ENCOUNTER — Other Ambulatory Visit: Payer: Self-pay

## 2023-04-26 ENCOUNTER — Encounter (HOSPITAL_COMMUNITY): Payer: Self-pay | Admitting: Emergency Medicine

## 2023-04-26 DIAGNOSIS — N179 Acute kidney failure, unspecified: Secondary | ICD-10-CM | POA: Diagnosis not present

## 2023-04-26 DIAGNOSIS — Z79899 Other long term (current) drug therapy: Secondary | ICD-10-CM | POA: Insufficient documentation

## 2023-04-26 DIAGNOSIS — I639 Cerebral infarction, unspecified: Secondary | ICD-10-CM | POA: Diagnosis present

## 2023-04-26 DIAGNOSIS — Z87891 Personal history of nicotine dependence: Secondary | ICD-10-CM | POA: Diagnosis not present

## 2023-04-26 DIAGNOSIS — E860 Dehydration: Secondary | ICD-10-CM | POA: Diagnosis not present

## 2023-04-26 DIAGNOSIS — Z7982 Long term (current) use of aspirin: Secondary | ICD-10-CM | POA: Insufficient documentation

## 2023-04-26 DIAGNOSIS — R0602 Shortness of breath: Secondary | ICD-10-CM

## 2023-04-26 DIAGNOSIS — Z8673 Personal history of transient ischemic attack (TIA), and cerebral infarction without residual deficits: Secondary | ICD-10-CM | POA: Diagnosis not present

## 2023-04-26 DIAGNOSIS — I10 Essential (primary) hypertension: Secondary | ICD-10-CM | POA: Diagnosis present

## 2023-04-26 DIAGNOSIS — I110 Hypertensive heart disease with heart failure: Secondary | ICD-10-CM | POA: Insufficient documentation

## 2023-04-26 DIAGNOSIS — J9811 Atelectasis: Secondary | ICD-10-CM | POA: Diagnosis not present

## 2023-04-26 DIAGNOSIS — Z1152 Encounter for screening for COVID-19: Secondary | ICD-10-CM | POA: Insufficient documentation

## 2023-04-26 DIAGNOSIS — Z96641 Presence of right artificial hip joint: Secondary | ICD-10-CM | POA: Diagnosis not present

## 2023-04-26 DIAGNOSIS — I5032 Chronic diastolic (congestive) heart failure: Secondary | ICD-10-CM | POA: Insufficient documentation

## 2023-04-26 DIAGNOSIS — R531 Weakness: Secondary | ICD-10-CM | POA: Diagnosis not present

## 2023-04-26 DIAGNOSIS — R059 Cough, unspecified: Secondary | ICD-10-CM | POA: Diagnosis not present

## 2023-04-26 DIAGNOSIS — J479 Bronchiectasis, uncomplicated: Secondary | ICD-10-CM | POA: Diagnosis not present

## 2023-04-26 DIAGNOSIS — R7989 Other specified abnormal findings of blood chemistry: Secondary | ICD-10-CM | POA: Diagnosis not present

## 2023-04-26 LAB — URINALYSIS, ROUTINE W REFLEX MICROSCOPIC
Bilirubin Urine: NEGATIVE
Glucose, UA: NEGATIVE mg/dL
Ketones, ur: NEGATIVE mg/dL
Leukocytes,Ua: NEGATIVE
Nitrite: NEGATIVE
Protein, ur: NEGATIVE mg/dL
Specific Gravity, Urine: 1.006 (ref 1.005–1.030)
pH: 6 (ref 5.0–8.0)

## 2023-04-26 LAB — CBC WITH DIFFERENTIAL/PLATELET
Abs Immature Granulocytes: 0.02 10*3/uL (ref 0.00–0.07)
Basophils Absolute: 0 10*3/uL (ref 0.0–0.1)
Basophils Relative: 1 %
Eosinophils Absolute: 0 10*3/uL (ref 0.0–0.5)
Eosinophils Relative: 0 %
HCT: 36.5 % (ref 36.0–46.0)
Hemoglobin: 11.8 g/dL — ABNORMAL LOW (ref 12.0–15.0)
Immature Granulocytes: 0 %
Lymphocytes Relative: 10 %
Lymphs Abs: 0.5 10*3/uL — ABNORMAL LOW (ref 0.7–4.0)
MCH: 32.2 pg (ref 26.0–34.0)
MCHC: 32.3 g/dL (ref 30.0–36.0)
MCV: 99.5 fL (ref 80.0–100.0)
Monocytes Absolute: 0.5 10*3/uL (ref 0.1–1.0)
Monocytes Relative: 11 %
Neutro Abs: 3.6 10*3/uL (ref 1.7–7.7)
Neutrophils Relative %: 78 %
Platelets: 114 10*3/uL — ABNORMAL LOW (ref 150–400)
RBC: 3.67 MIL/uL — ABNORMAL LOW (ref 3.87–5.11)
RDW: 13.6 % (ref 11.5–15.5)
WBC: 4.6 10*3/uL (ref 4.0–10.5)
nRBC: 0 % (ref 0.0–0.2)

## 2023-04-26 LAB — BRAIN NATRIURETIC PEPTIDE: B Natriuretic Peptide: 168 pg/mL — ABNORMAL HIGH (ref 0.0–100.0)

## 2023-04-26 LAB — HEPATIC FUNCTION PANEL
ALT: 18 U/L (ref 0–44)
AST: 31 U/L (ref 15–41)
Albumin: 3.6 g/dL (ref 3.5–5.0)
Alkaline Phosphatase: 64 U/L (ref 38–126)
Bilirubin, Direct: 0.3 mg/dL — ABNORMAL HIGH (ref 0.0–0.2)
Indirect Bilirubin: 1.2 mg/dL — ABNORMAL HIGH (ref 0.3–0.9)
Total Bilirubin: 1.5 mg/dL — ABNORMAL HIGH (ref 0.3–1.2)
Total Protein: 6.8 g/dL (ref 6.5–8.1)

## 2023-04-26 LAB — BLOOD GAS, VENOUS
Acid-Base Excess: 1.7 mmol/L (ref 0.0–2.0)
Bicarbonate: 28.2 mmol/L — ABNORMAL HIGH (ref 20.0–28.0)
Drawn by: 7534
O2 Saturation: 30.7 %
Patient temperature: 36.1
pCO2, Ven: 49 mmHg (ref 44–60)
pH, Ven: 7.36 (ref 7.25–7.43)
pO2, Ven: <31 mmHg — CL (ref 32–45)

## 2023-04-26 LAB — BASIC METABOLIC PANEL
Anion gap: 13 (ref 5–15)
BUN: 32 mg/dL — ABNORMAL HIGH (ref 8–23)
CO2: 23 mmol/L (ref 22–32)
Calcium: 8.9 mg/dL (ref 8.9–10.3)
Chloride: 103 mmol/L (ref 98–111)
Creatinine, Ser: 1.7 mg/dL — ABNORMAL HIGH (ref 0.44–1.00)
GFR, Estimated: 28 mL/min — ABNORMAL LOW (ref >60)
Glucose, Bld: 109 mg/dL — ABNORMAL HIGH (ref 70–99)
Potassium: 4.4 mmol/L (ref 3.5–5.1)
Sodium: 139 mmol/L (ref 135–145)

## 2023-04-26 LAB — TROPONIN I (HIGH SENSITIVITY)
Troponin I (High Sensitivity): 87 ng/L — ABNORMAL HIGH (ref <18)
Troponin I (High Sensitivity): 90 ng/L — ABNORMAL HIGH (ref <18)

## 2023-04-26 LAB — D-DIMER, QUANTITATIVE: D-Dimer, Quant: 1.29 ug/mL-FEU — ABNORMAL HIGH (ref 0.00–0.50)

## 2023-04-26 LAB — RESP PANEL BY RT-PCR (RSV, FLU A&B, COVID)  RVPGX2
Influenza A by PCR: NEGATIVE
Influenza B by PCR: NEGATIVE
Resp Syncytial Virus by PCR: NEGATIVE
SARS Coronavirus 2 by RT PCR: NEGATIVE

## 2023-04-26 LAB — MAGNESIUM: Magnesium: 2.3 mg/dL (ref 1.7–2.4)

## 2023-04-26 LAB — TSH: TSH: 1.765 u[IU]/mL (ref 0.350–4.500)

## 2023-04-26 MED ORDER — ACETAMINOPHEN 650 MG RE SUPP: 650.0000 mg | Freq: Four times a day (QID) | RECTAL | Status: DC | PRN

## 2023-04-26 MED ORDER — SODIUM CHLORIDE 0.9 % IV SOLN: INTRAVENOUS | Status: DC

## 2023-04-26 MED ORDER — HEPARIN SODIUM (PORCINE) 5000 UNIT/ML IJ SOLN
5000.0000 [IU] | Freq: Three times a day (TID) | INTRAMUSCULAR | Status: DC
  Administered 2023-04-26 – 2023-04-27 (×2): 5000 [IU] via SUBCUTANEOUS
  Filled 2023-04-26 (×2): qty 1

## 2023-04-26 MED ORDER — ONDANSETRON HCL 4 MG/2ML IJ SOLN: 4.0000 mg | Freq: Four times a day (QID) | INTRAMUSCULAR | Status: DC | PRN

## 2023-04-26 MED ORDER — ONDANSETRON HCL 4 MG PO TABS: 4.0000 mg | ORAL_TABLET | Freq: Four times a day (QID) | ORAL | Status: DC | PRN

## 2023-04-26 MED ORDER — PANTOPRAZOLE SODIUM 40 MG PO TBEC
40.0000 mg | DELAYED_RELEASE_TABLET | Freq: Every day | ORAL | Status: DC
  Administered 2023-04-27: 40 mg via ORAL
  Filled 2023-04-26: qty 1

## 2023-04-26 MED ORDER — PREDNISONE 50 MG PO TABS
60.0000 mg | ORAL_TABLET | Freq: Once | ORAL | Status: AC
  Administered 2023-04-26: 60 mg via ORAL
  Filled 2023-04-26: qty 1

## 2023-04-26 MED ORDER — SODIUM CHLORIDE 0.9 % IV SOLN
1.0000 g | Freq: Once | INTRAVENOUS | Status: AC
  Administered 2023-04-26: 1 g via INTRAVENOUS
  Filled 2023-04-26: qty 10

## 2023-04-26 MED ORDER — ASPIRIN 81 MG PO CHEW
81.0000 mg | CHEWABLE_TABLET | Freq: Every day | ORAL | Status: DC
  Administered 2023-04-27: 81 mg via ORAL
  Filled 2023-04-26: qty 1

## 2023-04-26 MED ORDER — TECHNETIUM TO 99M ALBUMIN AGGREGATED
4.3000 | Freq: Once | INTRAVENOUS | Status: AC | PRN
  Administered 2023-04-26: 4.3 via INTRAVENOUS

## 2023-04-26 MED ORDER — LACTATED RINGERS IV BOLUS
500.0000 mL | Freq: Once | INTRAVENOUS | Status: AC
  Administered 2023-04-26: 500 mL via INTRAVENOUS

## 2023-04-26 MED ORDER — POLYETHYLENE GLYCOL 3350 17 G PO PACK: 17.0000 g | PACK | Freq: Every day | ORAL | Status: DC | PRN

## 2023-04-26 MED ORDER — ROSUVASTATIN CALCIUM 20 MG PO TABS
20.0000 mg | ORAL_TABLET | Freq: Every day | ORAL | Status: DC
  Filled 2023-04-26: qty 1

## 2023-04-26 MED ORDER — IPRATROPIUM-ALBUTEROL 0.5-2.5 (3) MG/3ML IN SOLN: 3.0000 mL | Freq: Four times a day (QID) | RESPIRATORY_TRACT | Status: DC | PRN

## 2023-04-26 MED ORDER — SODIUM CHLORIDE 0.9 % IV SOLN
500.0000 mg | Freq: Once | INTRAVENOUS | Status: AC
  Administered 2023-04-26: 500 mg via INTRAVENOUS
  Filled 2023-04-26: qty 5

## 2023-04-26 MED ORDER — IPRATROPIUM-ALBUTEROL 0.5-2.5 (3) MG/3ML IN SOLN
3.0000 mL | Freq: Once | RESPIRATORY_TRACT | Status: AC
  Administered 2023-04-26: 3 mL via RESPIRATORY_TRACT
  Filled 2023-04-26: qty 3

## 2023-04-26 MED ORDER — ACETAMINOPHEN 325 MG PO TABS
650.0000 mg | ORAL_TABLET | Freq: Four times a day (QID) | ORAL | Status: DC | PRN
  Administered 2023-04-27: 650 mg via ORAL
  Filled 2023-04-26: qty 2

## 2023-04-26 NOTE — H&P (Signed)
History and Physical    Carrie Sanders ZOX:[id number] DOB: [birth year] DOA: 04/26/2023  PCP: Sunday Spillers, NP   Patient coming from: [facility]  I have personally briefly reviewed patient's old medical records in [facility] Link  Chief Complaint: Weakness, difficulty breathing  HPI: Carrie Sanders is a 89 y.o. female with medical history significant for hypertension, stroke.  Patient was brought to the ED from nursing home complaints of onset of difficulty breathing today and weakness for the past 3 days.  Patient reports at baseline she uses a walker to ambulate, but this morning she was unable to get up.  On Bumex for a time for lower extremity swelling, she is no longer taking it.  She reports okay oral intake, no vomiting, no loose stools.  She reports difficulty breathing today that resolved in the ED after DuoNeb treatment. No chest pain, no cough.  She reports recent diarrhea but that has improved.  ED Course: Temperature 97.7.  Heart rate 94-109.  Respiratory rate 15-24.  Blood pressure systolic 107-151.  O2 sats 92 to 99% on room air. Creatinine elevated at 1.7, baseline 1.2-1.4.  Troponin 87 > 3.  D-dimer elevated at 1.29.  X-rays shows probable COPD.  CT chest without contrast no focal lobar airspace consolidation. VQ scan negative for PE.  Review of Systems: As per HPI all other systems reviewed and negative.  Past Medical History:  Diagnosis Date   Arthritis    Constipation    Gait disorder 05/26/2016   H/O left breast biopsy    High cholesterol    Hypertension    Paresthesia 05/26/2016   Pessary maintenance    Sinus drainage    Sleeping difficulty     Past Surgical History:  Procedure Laterality Date   APPENDECTOMY     AGE 81   CATARACTS REMOVED     JOINT REPLACEMENT  1998   RT TOTAL HIP   TOTAL HIP ARTHROPLASTY  03/17/2012   Procedure: TOTAL HIP ARTHROPLASTY ANTERIOR APPROACH;  Surgeon: Shelda Pal, MD;  Location: [facility];  Service:  Orthopedics;  Laterality: Left;     reports that she quit smoking about 64 years ago. Her smoking use included cigarettes. She has never used smokeless tobacco. She reports that she does not drink alcohol and does not use drugs.  Allergies  Allergen Reactions   Codeine Nausea Only and Other (See Comments)    Pass out    Sulfonamide Derivatives Rash    Family History  Problem Relation Age of Onset   Heart disease Brother    Stroke Mother    Cerebral aneurysm Sister     Prior to Admission medications   Medication Sig Start Date End Date Taking? Authorizing Provider  acetaminophen (TYLENOL) 325 MG tablet Take 650 mg by mouth every 6 (six) hours as needed for fever.   Yes [provider]  acetaminophen (TYLENOL) 650 MG suppository Place 650 mg rectally every 4 (four) hours as needed for fever.   Yes [provider]  aspirin 81 MG chewable tablet Chew 1 tablet (81 mg total) by mouth daily. 07/23/22  Yes Gherghe, Daylene Katayama, MD  bumetanide (BUMEX) 1 MG tablet Take 1 mg by mouth daily.   Yes [provider]  cephALEXin (KEFLEX) 500 MG capsule Take 1 capsule (500 mg total) by mouth 4 (four) times daily. 04/18/23  Yes Prosperi, Christian H, PA-C  Cholecalciferol (VITAMIN D3) 50 MCG (2000 UT) TABS Take 1 tablet by mouth  daily.   Yes [provider]  loperamide (IMODIUM) 2 MG capsule Take 2 mg by mouth every 6 (six) hours. 01/26/23  Yes [provider]  losartan (COZAAR) 100 MG tablet Take 50 mg by mouth every morning.   Yes [provider]  meloxicam (MOBIC) 7.5 MG tablet Take 7.5 mg by mouth every morning. 03/18/21  Yes [provider]  Menthol, Topical Analgesic, (COOLING PAIN RELIEF) 4 % GEL Apply topically 3 (three) times daily. Apply topically to lower back, knees, or hands.   Yes [provider]  pantoprazole (PROTONIX) 40 MG tablet Take 40 mg by mouth daily.   Yes [provider]  potassium chloride (KLOR-CON) 10  MEQ tablet Take 10 mEq by mouth daily. Take 1 tablet by mouth once daily with Bumex.   Yes [provider]  rosuvastatin (CRESTOR) 20 MG tablet Take 20 mg by mouth at bedtime.   Yes [provider]  Sennosides-Docusate Sodium 8.6-50 MG CAPS Take 1 capsule by mouth at bedtime as needed (constpation). Take 1 tablet by mouth at bedtime as needed.   Yes [provider]  traMADol (ULTRAM) 50 MG tablet Take 1 tablet (50 mg total) by mouth at bedtime. 07/22/22  Yes Gherghe, Daylene Katayama, MD  PYRIDIUM 100 MG tablet Take 100 mg by mouth 3 (three) times daily. 04/18/23   [provider]    Physical Exam: Vitals:   04/26/23 1600 04/26/23 1700 04/26/23 1730 04/26/23 1800  BP: 137/83 134/80 133/82 139/77  Pulse: 96 94 (!) 101 (!) 104  Resp: 15 17 19 15   Temp:      TempSrc:      SpO2: 94% 95% 97% 93%  Weight:      Height:        Constitutional: NAD, calm, comfortable Vitals:   04/26/23 1600 04/26/23 1700 04/26/23 1730 04/26/23 1800  BP: 137/83 134/80 133/82 139/77  Pulse: 96 94 (!) 101 (!) 104  Resp: 15 17 19 15   Temp:      TempSrc:      SpO2: 94% 95% 97% 93%  Weight:      Height:       Eyes: PERRL, lids and conjunctivae normal ENMT: Mucous membranes are moist.  Neck: normal, supple, no masses, no thyromegaly Respiratory: clear to auscultation bilaterally, no wheezing, no crackles. Normal respiratory effort. No accessory muscle use.  Cardiovascular: Regular rate and rhythm, no murmurs / rubs / gallops. No extremity edema.  Abdomen: no tenderness, no masses palpated. No hepatosplenomegaly. Bowel sounds positive.  Musculoskeletal: no clubbing / cyanosis. No joint deformity upper and lower extremities. Skin: no rashes, lesions, ulcers. No induration Neurologic: No facial asymmetry, 4/5 in all extremities. Psychiatric: Normal judgment and insight. Alert and oriented x 3. Normal mood.   Labs on Admission: I have personally reviewed following labs and imaging  studies  CBC: Recent Labs  Lab 04/26/23 1150  WBC 4.6  NEUTROABS 3.6  HGB 11.8*  HCT 36.5  MCV 99.5  PLT 114*   Basic Metabolic Panel: Recent Labs  Lab 04/26/23 1150  NA 139  K 4.4  CL 103  CO2 23  GLUCOSE 109*  BUN 32*  CREATININE 1.70*  CALCIUM 8.9  MG 2.3   GFR: Estimated Creatinine Clearance: 15.6 mL/min (A) (by C-G formula based on SCr of 1.7 mg/dL (H)). Liver Function Tests: Recent Labs  Lab 04/26/23 1150  AST 31  ALT 18  ALKPHOS 64  BILITOT 1.5*  PROT 6.8  ALBUMIN 3.6  Thyroid Function Tests: Recent Labs    04/26/23 1150  TSH 1.765   Urine analysis:    Component Value Date/Time   COLORURINE YELLOW 04/26/2023 1205   APPEARANCEUR CLEAR 04/26/2023 1205   LABSPEC 1.006 04/26/2023 1205   PHURINE 6.0 04/26/2023 1205   GLUCOSEU NEGATIVE 04/26/2023 1205   HGBUR SMALL (A) 04/26/2023 1205   BILIRUBINUR NEGATIVE 04/26/2023 1205   KETONESUR NEGATIVE 04/26/2023 1205   PROTEINUR NEGATIVE 04/26/2023 1205   UROBILINOGEN 0.2 03/09/2012 1107   NITRITE NEGATIVE 04/26/2023 1205   LEUKOCYTESUR NEGATIVE 04/26/2023 1205    Radiological Exams on Admission: CT Chest Wo Contrast  Result Date: 04/26/2023 CLINICAL DATA:  Respiratory illness. EXAM: CT CHEST WITHOUT CONTRAST TECHNIQUE: Multidetector CT imaging of the chest was performed following the standard protocol without IV contrast. RADIATION DOSE REDUCTION: This exam was performed according to the departmental dose-optimization program which includes automated exposure control, adjustment of the mA and/or kV according to patient size and/or use of iterative reconstruction technique. COMPARISON:  Chest radiograph from the same date. FINDINGS: Cardiovascular: Mildly enlarged heart. Minimal pericardial effusion. Heavy calcific atherosclerotic disease of the coronary arteries and aorta. Annular calcifications of the mitral and aortic valves. Mediastinum/Nodes: No enlarged mediastinal or axillary lymph nodes. Thyroid  gland, trachea, and esophagus demonstrate no significant findings. Lungs/Pleura: Mild chronic interstitial changes and bronchiectasis. No focal lobar airspace consolidation. Minimal atelectasis in the lower lobes. Upper Abdomen: Too small to be accurately characterized hypoattenuated nodules throughout the liver parenchyma. Musculoskeletal: Spondylosis of the thoracic spine. IMPRESSION: 1. Mild chronic interstitial changes and bronchiectasis. 2. No focal lobar airspace consolidation. 3. Mildly enlarged heart. Minimal pericardial effusion. 4. Heavy calcific atherosclerotic disease of the coronary arteries and aorta. 5. Too small to be accurately characterized hypoattenuated nodules throughout the liver parenchyma. 6. Aortic atherosclerosis. Aortic Atherosclerosis (ICD10-I70.0). Electronically Signed   By: Ted Mcalpine M.D.   On: 04/26/2023 17:13   DG Chest Portable 1 View  Result Date: 04/26/2023 CLINICAL DATA:  Shortness of breath. EXAM: PORTABLE CHEST 1 VIEW COMPARISON:  None Available. FINDINGS: The heart size and mediastinal contours are within normal limits. Aortic atherosclerotic calcification incidentally noted. Pulmonary hyperinflation again noted. Both lungs are clear. IMPRESSION: No active cardiopulmonary disease.  Probable COPD. Electronically Signed   By: Danae Orleans M.D.   On: 04/26/2023 11:49    EKG: Independently reviewed.  Sinus rhythm, rate 95, QTc 463.  Incomplete right bundle branch block.  No significant change from prior.  Assessment/Plan Principal Problem:   AKI (acute kidney injury) (HCC) Active Problems:   General weakness   Stroke (HCC)   HTN (hypertension)   Assessment and Plan: * AKI (acute kidney injury) (HCC) Mild. Cr- 1.7.  Baseline 1.2-1.4.  Related to diuretics- Bumex . -Hold losartan -100 mill bolus given, continue N/s 100cc/hr x 20hrs  General weakness Generalized weakness.  Advanced age.  Recent diarrhea that has improved.  UA not suggestive of  infection.  Troponin 87 > 90.  Dyspnea has resolved.  CT chest without contrast, VQ scan negative for acute abnormality.  No PE. - Ambulate in a.m.  HTN (hypertension) Blood pressure systolic 10 7-1 60s. - hold lorsartan with AKI -As needed labetalol 5mg  for systolic > 170.  Stroke (HCC) Stable.  No focal deficits. -Resume aspirin, Crestor   DVT prophylaxis: Lovenox Code Status: DNR- Confirmed with patient at bedside.  Tells me she has a living will stating same. Family Communication: None at bedside.  Daughter - [place] is HCPOA. Disposition Plan: ~  1 days Consults called:None  Admission status: Obs tele   Author: Onnie Boer, MD 04/26/2023 9:24 PM  For on call review [email or web address].

## 2023-04-26 NOTE — ED Notes (Addendum)
Attempted stick to pt's left forearm unsuccessful, bruising noted to left forearm to left wrist, ice applied to left lower arm to reduce swelling and bruising.

## 2023-04-26 NOTE — ED Provider Notes (Signed)
Signout from Dr. Durwin Nora.  89 year old female with general weakness cough shortness of breath.  She has an elevated D-dimer.  Covered with antibiotics for possible pneumonia.  Getting CT noncontrast and will possibly need VQ scan due to her AKI. Physical Exam  BP 137/83   Pulse 96   Temp 97.8 F (36.6 C) (Oral)   Resp 15   Ht 5\' 1"  (1.549 m)   Wt 50.8 kg   SpO2 94%   BMI 21.16 kg/m   Physical Exam  Procedures  Procedures  ED Course / MDM    Medical Decision Making Amount and/or Complexity of Data Reviewed Labs: ordered. Radiology: ordered.   Plan is for admission       Terrilee Files, MD 04/27/23 [phone]

## 2023-04-26 NOTE — Assessment & Plan Note (Addendum)
Mild. Cr- 1.7.  Baseline 1.2-1.4.  Related to diuretics- Bumex . -Hold losartan -100 mill bolus given, continue N/s 100cc/hr x 20hrs

## 2023-04-26 NOTE — ED Triage Notes (Signed)
Pt BIB [facility] for SOB, resident at nursing facility, weakness x 3 days, SOB started today, currently being treated for UTI, last dose of ABT today.

## 2023-04-26 NOTE — Assessment & Plan Note (Addendum)
Generalized weakness.  Advanced age.  Recent diarrhea that has improved.  UA not suggestive of infection.  Troponin 87 > 90.  Dyspnea has resolved.  CT chest without contrast, VQ scan negative for acute abnormality.  No PE. - Ambulate in a.m.

## 2023-04-26 NOTE — Assessment & Plan Note (Signed)
Stable.  No focal deficits. -Resume aspirin, Crestor

## 2023-04-26 NOTE — Assessment & Plan Note (Signed)
Blood pressure systolic 10 7-1 60s. - hold lorsartan with AKI -As needed labetalol 5mg  for systolic > 170.

## 2023-04-26 NOTE — ED Provider Notes (Signed)
[place] EMERGENCY DEPARTMENT AT [facility] Provider Note   CSN: [id number] Arrival date & time: 04/26/23  1052     History  Chief Complaint  Patient presents with   Shortness of Breath    Carrie Sanders is a 89 y.o. female.   Shortness of Breath Patient presents for generalized weakness.  Medical history includes arthritis, HLD, HTN, GERD, CVA.  She was seen in the ED 8 days ago and prescribed Keflex for UTI.  She has completed this course of antibiotics.  Patient reports diarrhea since she started the antibiotics.  Diarrhea has since improved.  Yesterday, she experienced generalized weakness.  This has worsened today.  She also reports a transient episode of shortness of breath earlier this morning.  Shortness of breath seem to improve with supplemental oxygen, which EMS placed for comfort.  Currently, patient denies any areas of discomfort.  She states that her breathing feels normal to her on room air.  She continues to endorse generalized weakness.  History per daughter: Patient lives in an assisted living facility.  She typically ambulates with a walker short distances.  When she needs to go to the cafeteria, staff at the facility are able to put her in a wheelchair.  Over the past week, patient has not been able to walk.  She has had a cough that became productive of yellow sputum.     Home Medications Prior to Admission medications   Medication Sig Start Date End Date Taking? Authorizing Provider  acetaminophen (TYLENOL) 325 MG tablet Take 650 mg by mouth every 6 (six) hours as needed for fever.   Yes [provider]  acetaminophen (TYLENOL) 650 MG suppository Place 650 mg rectally every 4 (four) hours as needed for fever.   Yes [provider]  aspirin 81 MG chewable tablet Chew 1 tablet (81 mg total) by mouth daily. 07/23/22  Yes Gherghe, Daylene Katayama, MD  bumetanide (BUMEX) 1 MG tablet Take 1 mg by mouth daily.   Yes [provider]   cephALEXin (KEFLEX) 500 MG capsule Take 1 capsule (500 mg total) by mouth 4 (four) times daily. 04/18/23  Yes Prosperi, Christian H, PA-C  Cholecalciferol (VITAMIN D3) 50 MCG (2000 UT) TABS Take 1 tablet by mouth daily.   Yes [provider]  loperamide (IMODIUM) 2 MG capsule Take 2 mg by mouth every 6 (six) hours. 01/26/23  Yes [provider]  losartan (COZAAR) 100 MG tablet Take 50 mg by mouth every morning.   Yes [provider]  meloxicam (MOBIC) 7.5 MG tablet Take 7.5 mg by mouth every morning. 03/18/21  Yes [provider]  Menthol, Topical Analgesic, (COOLING PAIN RELIEF) 4 % GEL Apply topically 3 (three) times daily. Apply topically to lower back, knees, or hands.   Yes [provider]  pantoprazole (PROTONIX) 40 MG tablet Take 40 mg by mouth daily.   Yes [provider]  potassium chloride (KLOR-CON) 10 MEQ tablet Take 10 mEq by mouth daily. Take 1 tablet by mouth once daily with Bumex.   Yes [provider]  rosuvastatin (CRESTOR) 20 MG tablet Take 20 mg by mouth at bedtime.   Yes [provider]  Sennosides-Docusate Sodium 8.6-50 MG CAPS Take 1 capsule by mouth at bedtime as needed (constpation). Take 1 tablet by mouth at bedtime as needed.   Yes [provider]  traMADol (ULTRAM) 50 MG tablet Take 1 tablet (50 mg total) by mouth at bedtime. 07/22/22  Yes Gherghe,  Costin M, MD  PYRIDIUM 100 MG tablet Take 100 mg by mouth 3 (three) times daily. 04/18/23   [provider]      Allergies    Codeine and Sulfonamide derivatives    Review of Systems   Review of Systems  Respiratory:  Positive for shortness of breath.   Gastrointestinal:  Positive for diarrhea (Resolved).  Neurological:  Positive for weakness (Generalized).  All other systems reviewed and are negative.   Physical Exam Updated Vital Signs BP 134/80   Pulse 94   Temp 97.8 F (36.6 C) (Oral)   Resp 17   Ht 5\' 1"  (1.549 m)   Wt  50.8 kg   SpO2 95%   BMI 21.16 kg/m  Physical Exam Vitals and nursing note reviewed.  Constitutional:      General: She is not in acute distress.    Appearance: She is well-developed. She is not ill-appearing, toxic-appearing or diaphoretic.  HENT:     Head: Normocephalic and atraumatic.     Mouth/Throat:     Mouth: Mucous membranes are moist.  Eyes:     Conjunctiva/sclera: Conjunctivae normal.  Cardiovascular:     Rate and Rhythm: Normal rate and regular rhythm.     Heart sounds: No murmur heard. Pulmonary:     Effort: Pulmonary effort is normal. No tachypnea, accessory muscle usage or respiratory distress.     Breath sounds: Normal breath sounds. No decreased breath sounds, wheezing, rhonchi or rales.  Chest:     Chest wall: No tenderness.  Abdominal:     Palpations: Abdomen is soft.     Tenderness: There is no abdominal tenderness.  Musculoskeletal:        General: No swelling.     Cervical back: Normal range of motion and neck supple.  Skin:    General: Skin is warm and dry.     Coloration: Skin is not cyanotic or pale.  Neurological:     General: No focal deficit present.     Mental Status: She is alert and oriented to person, place, and time.  Psychiatric:        Mood and Affect: Mood normal.        Behavior: Behavior normal.     ED Results / Procedures / Treatments   Labs (all labs ordered are listed, but only abnormal results are displayed) Labs Reviewed  CBC WITH DIFFERENTIAL/PLATELET - Abnormal; Notable for the following components:      Result Value   RBC 3.67 (*)    Hemoglobin 11.8 (*)    Platelets 114 (*)    Lymphs Abs 0.5 (*)    All other components within normal limits  BASIC METABOLIC PANEL - Abnormal; Notable for the following components:   Glucose, Bld 109 (*)    BUN 32 (*)    Creatinine, Ser 1.70 (*)    GFR, Estimated 28 (*)    All other components within normal limits  HEPATIC FUNCTION PANEL - Abnormal; Notable for the following  components:   Total Bilirubin 1.5 (*)    Bilirubin, Direct 0.3 (*)    Indirect Bilirubin 1.2 (*)    All other components within normal limits  BRAIN NATRIURETIC PEPTIDE - Abnormal; Notable for the following components:   B Natriuretic Peptide 168.0 (*)    All other components within normal limits  URINALYSIS, ROUTINE W REFLEX MICROSCOPIC - Abnormal; Notable for the following components:   Hgb urine dipstick SMALL (*)    Bacteria, UA RARE (*)  All other components within normal limits  BLOOD GAS, VENOUS - Abnormal; Notable for the following components:   pO2, Ven <31 (*)    Bicarbonate 28.2 (*)    All other components within normal limits  D-DIMER, QUANTITATIVE - Abnormal; Notable for the following components:   D-Dimer, Quant 1.29 (*)    All other components within normal limits  TROPONIN I (HIGH SENSITIVITY) - Abnormal; Notable for the following components:   Troponin I (High Sensitivity) 87 (*)    All other components within normal limits  TROPONIN I (HIGH SENSITIVITY) - Abnormal; Notable for the following components:   Troponin I (High Sensitivity) 90 (*)    All other components within normal limits  RESP PANEL BY RT-PCR (RSV, FLU A&B, COVID)  RVPGX2  C DIFFICILE QUICK SCREEN W PCR REFLEX    MAGNESIUM  TSH    EKG EKG Interpretation  Date/Time:  Monday Apr 26 2023 10:59:21 EDT Ventricular Rate:  95 PR Interval:  194 QRS Duration: 118 QT Interval:  368 QTC Calculation: 463 R Axis:   -84 Text Interpretation: Sinus rhythm Incomplete right bundle branch block Baseline wander in lead(s) II III aVR aVL aVF V3 V4 Confirmed by Gloris Manchester [phone]) on 04/26/2023 1:39:21 PM  Radiology CT Chest Wo Contrast  Result Date: 04/26/2023 CLINICAL DATA:  Respiratory illness. EXAM: CT CHEST WITHOUT CONTRAST TECHNIQUE: Multidetector CT imaging of the chest was performed following the standard protocol without IV contrast. RADIATION DOSE REDUCTION: This exam was performed according to the  departmental dose-optimization program which includes automated exposure control, adjustment of the mA and/or kV according to patient size and/or use of iterative reconstruction technique. COMPARISON:  Chest radiograph from the same date. FINDINGS: Cardiovascular: Mildly enlarged heart. Minimal pericardial effusion. Heavy calcific atherosclerotic disease of the coronary arteries and aorta. Annular calcifications of the mitral and aortic valves. Mediastinum/Nodes: No enlarged mediastinal or axillary lymph nodes. Thyroid gland, trachea, and esophagus demonstrate no significant findings. Lungs/Pleura: Mild chronic interstitial changes and bronchiectasis. No focal lobar airspace consolidation. Minimal atelectasis in the lower lobes. Upper Abdomen: Too small to be accurately characterized hypoattenuated nodules throughout the liver parenchyma. Musculoskeletal: Spondylosis of the thoracic spine. IMPRESSION: 1. Mild chronic interstitial changes and bronchiectasis. 2. No focal lobar airspace consolidation. 3. Mildly enlarged heart. Minimal pericardial effusion. 4. Heavy calcific atherosclerotic disease of the coronary arteries and aorta. 5. Too small to be accurately characterized hypoattenuated nodules throughout the liver parenchyma. 6. Aortic atherosclerosis. Aortic Atherosclerosis (ICD10-I70.0). Electronically Signed   By: Ted Mcalpine M.D.   On: 04/26/2023 17:13   DG Chest Portable 1 View  Result Date: 04/26/2023 CLINICAL DATA:  Shortness of breath. EXAM: PORTABLE CHEST 1 VIEW COMPARISON:  None Available. FINDINGS: The heart size and mediastinal contours are within normal limits. Aortic atherosclerotic calcification incidentally noted. Pulmonary hyperinflation again noted. Both lungs are clear. IMPRESSION: No active cardiopulmonary disease.  Probable COPD. Electronically Signed   By: Danae Orleans M.D.   On: 04/26/2023 11:49    Procedures Procedures    Medications Ordered in ED Medications   azithromycin (ZITHROMAX) 500 mg in sodium chloride 0.9 % 250 mL IVPB (500 mg Intravenous New Bag/Given 04/26/23 1719)  predniSONE (DELTASONE) tablet 60 mg (has no administration in time range)  ipratropium-albuterol (DUONEB) 0.5-2.5 (3) MG/3ML nebulizer solution 3 mL (has no administration in time range)  lactated ringers bolus 500 mL (0 mLs Intravenous Stopped 04/26/23 1428)  cefTRIAXone (ROCEPHIN) 1 g in sodium chloride 0.9 % 100 mL IVPB (0 g Intravenous  Stopped 04/26/23 1702)    ED Course/ Medical Decision Making/ A&P                             Medical Decision Making Amount and/or Complexity of Data Reviewed Labs: ordered. Radiology: ordered.   This patient presents to the ED for concern of shortness of breath, generalized weakness, this involves an extensive number of treatment options, and is a complaint that carries with it a high risk of complications and morbidity.  The differential diagnosis includes pneumonia, dehydration, CHF, PE, deconditioning, anemia, acidosis, other metabolic derangements   Co morbidities that complicate the patient evaluation  arthritis, HLD, HTN, GERD, CVA   Additional history obtained:  Additional history obtained from patient's daughter External records from outside source obtained and reviewed including EMR   Lab Tests:  I Ordered, and personally interpreted labs.  The pertinent results include:  normal hemoglobin, no leukocytosis, creatinine increased from baseline, NP improved from 1 week ago, mild elevation in D-dimer and troponins.  Troponin stable.  Urinalysis shows resolution of prior UTI.   Imaging Studies ordered:  I ordered imaging studies including chest x-ray, CT chest, VQ scan I independently visualized and interpreted imaging which showed chronic interstitial changes of bronchiectasis, cardiomegaly, small pericardial effusion I agree with the radiologist interpretation   Cardiac Monitoring: / EKG:  The patient was  maintained on a cardiac monitor.  I personally viewed and interpreted the cardiac monitored which showed an underlying rhythm of: Sinus rhythm   Problem List / ED Course / Critical interventions / Medication management  Patient presenting for generalized weakness since yesterday as well as a transient episode of shortness of breath this morning.  On arrival in the ED, she is well-appearing.  Her breathing is unlabored.  She is able to speak in complete sentences.  Lungs are clear to auscultation.  EKG shows normal sinus rhythm with incomplete RBBB.  Because of weakness is unclear at this time.  Broad workup was initiated.  Lab work was notable for mild elevation in creatinine from baseline.  Small bolus of IV fluids was ordered.  She has improved BNP from 1 week ago, and mild elevations in troponin and D-dimer.  Troponin was stable on repeat.  Given her low GFR, VQ scan was ordered to evaluate for PE.  Reassessment, patient continues to have unlabored breathing while at rest.  Her daughter now accompanies her at bedside and is able to provide further history.  Daughter reports that generalized weakness has been ongoing for the past week.  Although she is typically able to walk with a walker, she has been unable to over the past 5 days.  Daughter also reports a worsening cough that has now become productive of yellow sputum.  Patient was ordered antibiotics for clinical pneumonia.  Noncontrasted CT scan of chest was ordered.  Although she does not have diagnosed COPD, chest x-ray does show some hyperinflation.  Prednisone and DuoNeb were ordered.  Plan will be for admission.  Care patient was signed out to oncoming ED provider. I ordered medication including IV fluids for hydration; antibiotics, DuoNeb, and prednisone for cough with yellow sputum production Reevaluation of the patient after these medicines showed that the patient improved I have reviewed the patients home medicines and have made adjustments  as needed   Social Determinants of Health:  Resides in assisted living facility         Final Clinical Impression(s) / ED  Diagnoses Final diagnoses:  SOB (shortness of breath)  Generalized weakness    Rx / DC Orders ED Discharge Orders     None         Gloris Manchester, MD 04/26/23 1739

## 2023-04-26 NOTE — ED Notes (Signed)
Pt in Nuclear Medicine

## 2023-04-27 DIAGNOSIS — R531 Weakness: Secondary | ICD-10-CM | POA: Diagnosis not present

## 2023-04-27 DIAGNOSIS — N179 Acute kidney failure, unspecified: Secondary | ICD-10-CM | POA: Diagnosis not present

## 2023-04-27 DIAGNOSIS — I110 Hypertensive heart disease with heart failure: Secondary | ICD-10-CM | POA: Diagnosis not present

## 2023-04-27 DIAGNOSIS — I10 Essential (primary) hypertension: Secondary | ICD-10-CM | POA: Diagnosis not present

## 2023-04-27 DIAGNOSIS — E860 Dehydration: Secondary | ICD-10-CM | POA: Diagnosis not present

## 2023-04-27 DIAGNOSIS — I639 Cerebral infarction, unspecified: Secondary | ICD-10-CM

## 2023-04-27 LAB — BASIC METABOLIC PANEL
Anion gap: 15 (ref 5–15)
BUN: 27 mg/dL — ABNORMAL HIGH (ref 8–23)
CO2: 17 mmol/L — ABNORMAL LOW (ref 22–32)
Calcium: 8.6 mg/dL — ABNORMAL LOW (ref 8.9–10.3)
Chloride: 105 mmol/L (ref 98–111)
Creatinine, Ser: 1.4 mg/dL — ABNORMAL HIGH (ref 0.44–1.00)
GFR, Estimated: 35 mL/min — ABNORMAL LOW (ref >60)
Glucose, Bld: 128 mg/dL — ABNORMAL HIGH (ref 70–99)
Potassium: 3.9 mmol/L (ref 3.5–5.1)
Sodium: 137 mmol/L (ref 135–145)

## 2023-04-27 MED ORDER — TRAMADOL HCL 50 MG PO TABS
50.0000 mg | ORAL_TABLET | Freq: Once | ORAL | Status: AC
  Administered 2023-04-27: 50 mg via ORAL
  Filled 2023-04-27: qty 1

## 2023-04-27 MED ORDER — BUMETANIDE 1 MG PO TABS: 1.0000 mg | ORAL_TABLET | Freq: Every day | ORAL | Status: DC | PRN

## 2023-04-27 MED ORDER — POLYETHYLENE GLYCOL 3350 17 G PO PACK: 17.0000 g | PACK | Freq: Every day | ORAL | 0 refills | Status: AC | PRN

## 2023-04-27 MED ORDER — TRAMADOL HCL 50 MG PO TABS: 50.0000 mg | ORAL_TABLET | Freq: Every day | ORAL | 0 refills | Status: DC | PRN

## 2023-04-27 NOTE — TOC Transition Note (Signed)
Transition of Care [facility]) - CM/SW Discharge Note   Patient Details  Name: Carrie Sanders MRN: [id number] Date of Birth: [birth year]  Transition of [facility] CM/SW Contact:  Elliot Gault, LCSW Phone Number: 04/27/2023, 11:55 AM   Clinical Narrative:     Pt here observation status from [facility]. Per MD, pt medically stable for dc today.   Spoke with pt and dtr at bedside to review dc plan. Both pt and dtr report that plan is for return to the ALF. TOC contacted [facility] and spoke with Lupita Leash who states that pt can return today and that they can pick her up with their w/c van at 12:30.  Updated RN. DC clinical faxed as requested.  There are no other TOC needs for dc.  Expected Discharge Plan: Assisted Living Barriers to Discharge: Barriers Resolved   Patient Goals and CMS Choice Patient states their goals for this hospitalization and ongoing recovery are:: return to ALF   Choice offered to / list presented to : Patient  Expected Discharge Plan and Services Expected Discharge Plan: Assisted Living In-house Referral: Clinical Social Work   Post Acute Care Choice: Resumption of Svcs/PTA Provider Living arrangements for the past 2 months: Assisted Living Facility Expected Discharge Date: 04/27/23                                    Prior Living Arrangements/Services Living arrangements for the past 2 months: Assisted Living Facility Lives with:: Facility Resident Patient language and need for interpreter reviewed:: Yes Do you feel safe going back to the place where you live?: Yes      Need for Family Participation in Patient Care: No (Comment) Care giver support system in place?: Yes (comment) Current home services: Home PT, DME Criminal Activity/Legal Involvement Pertinent to Current Situation/Hospitalization: No - Comment as needed  Activities of Daily Living Home Assistive Devices/Equipment: Dan Humphreys (specify type) ADL Screening (condition at time  of admission) Patient's cognitive ability adequate to safely complete daily activities?: Yes Is the patient deaf or have difficulty hearing?: Yes Does the patient have difficulty seeing, even when wearing glasses/contacts?: No Does the patient have difficulty concentrating, remembering, or making decisions?: No Patient able to express need for assistance with ADLs?: Yes Does the patient have difficulty dressing or bathing?: Yes Independently performs ADLs?: No Communication: Independent Is this a change from baseline?: Pre-admission baseline Dressing (OT): Needs assistance Is this a change from baseline?: Pre-admission baseline Grooming: Needs assistance Is this a change from baseline?: Pre-admission baseline Feeding: Independent with device (comment) Bathing: Needs assistance Is this a change from baseline?: Pre-admission baseline Toileting: Independent with device (comment) Is this a change from baseline?: Pre-admission baseline In/Out Bed: Needs assistance Is this a change from baseline?: Pre-admission baseline Walks in Home: Needs assistance Is this a change from baseline?: Pre-admission baseline Does the patient have difficulty walking or climbing stairs?: Yes Weakness of Legs: None Weakness of Arms/Hands: Both  Permission Sought/Granted Permission sought to share information with : Oceanographer granted to share information with : Yes, Verbal Permission Granted     Permission granted to share info w AGENCY: [facility]        Emotional Assessment Appearance:: Appears younger than stated age Attitude/Demeanor/Rapport: Engaged Affect (typically observed): Pleasant Orientation: : Oriented to Self, Oriented to Place, Oriented to  Time, Oriented to Situation Alcohol / Substance Use: Not Applicable Psych Involvement: No (  comment)  Admission diagnosis:  SOB (shortness of breath) [R06.02] Generalized weakness [R53.1] AKI (acute kidney injury)  (HCC) [N17.9] Patient Active Problem List   Diagnosis Date Noted   Dehydration 04/27/2023   AKI (acute kidney injury) (HCC) 04/26/2023   Generalized weakness 04/26/2023   Ischemic stroke (HCC) 07/20/2022   Hypokalemia 07/19/2022   Stroke-like symptoms 07/19/2022   Stroke (HCC) 07/18/2022   HTN (hypertension) 07/18/2022   HLD (hyperlipidemia) 07/18/2022   GERD (gastroesophageal reflux disease) 07/18/2022   Spinal stenosis at L4-L5 level 07/24/2016   Paresthesia 05/26/2016   Gait disorder 05/26/2016   S/P Left THA, AA 03/17/2012   KNEE, ARTHRITIS, DEGEN./OSTEO 05/23/2009   KNEE PAIN 05/23/2009   ANSERINE BURSITIS, LEFT 05/23/2009   TRIGGER FINGER 05/23/2009   HAND PAIN 05/23/2009   HIGH BLOOD PRESSURE 05/23/2009   PCP:  Sunday Spillers, NP Pharmacy:   [facility] [phone] - [place], [place] - [address] AT [facility] [address] & [address] [place] [address] Phone: [phone] Fax: [phone]  [facility] - [place], [place] - [address]  [place] [address] Phone: [phone] Fax: [phone]     Social Determinants of Health (SDOH) Interventions    Readmission Risk Interventions     No data to display           Final next level of care: Assisted Living Barriers to Discharge: Barriers Resolved   Patient Goals and CMS Choice   Choice offered to / list presented to : Patient  Discharge Placement                         Discharge Plan and Services Additional resources added to the After Visit Summary for   In-house Referral: Clinical Social Work   Post Acute Care Choice: Resumption of Svcs/PTA Provider                               Social Determinants of Health (SDOH) Interventions SDOH Screenings   Food Insecurity: No Food Insecurity (04/26/2023)  Housing: Low Risk  (04/26/2023)  Transportation Needs: No Transportation Needs (04/26/2023)  Utilities: Not At  Risk (04/26/2023)  Tobacco Use: Medium Risk (04/26/2023)     Readmission Risk Interventions     No data to display

## 2023-04-27 NOTE — Discharge Summary (Addendum)
Physician Discharge Summary   Patient: Carrie Sanders MRN: [id number] DOB: [birth year]  Admit date:     04/26/2023  Discharge date: 04/27/23  Discharge Physician: Vassie Loll   PCP: Sunday Spillers, NP   Recommendations at discharge:  Repeat basic metabolic panel to follow ultralights and renal function Reassessed blood pressure and adjust antihypertensive regimen. Continue close monitoring of patient volume status and the need for resumption on daily basis diuretic therapy. -Assess patient need for analgesic therapy due to overall history of arthritis.  Discharge Diagnoses: Principal Problem:   AKI (acute kidney injury) (HCC) Active Problems:   Generalized weakness   Stroke (HCC)   HTN (hypertension)   Dehydration  Hospital Course: Carrie Sanders is a 89 y.o. female with medical history significant for hypertension, stroke.  Patient was brought to the ED from nursing home complaints of onset of difficulty breathing today and weakness for the past 3 days.  Patient reports at baseline she uses a walker to ambulate, but this morning she was unable to get up.  On Bumex for a time for lower extremity swelling, she is no longer taking it.  She reports okay oral intake, no vomiting, no loose stools.  She reports difficulty breathing today that resolved in the ED after DuoNeb treatment. No chest pain, no cough.  She reports recent diarrhea but that has improved.   ED Course: Temperature 97.7.  Heart rate 94-109.  Respiratory rate 15-24.  Blood pressure systolic 107-151.  O2 sats 92 to 99% on room air. Creatinine elevated at 1.7, baseline 1.2-1.4.  Troponin 87 > 3.  D-dimer elevated at 1.29.  X-rays shows probable COPD.  CT chest without contrast no focal lobar airspace consolidation. VQ scan negative for PE.  Assessment and Plan: * AKI (acute kidney injury) (HCC) -Acute kidney injury on chronic renal failure -Per records review patient chronic kidney disease stage 3B at  baseline -Patient baseline creatinine 1.2-1.4; at time of admission creatinine up to 1.7. -Appears to be secondary to prerenal azotemia with dehydration from GI losses and continue use of nephrotoxic agents. -At discharge we will continue the use of as needed Bumex for edema, shortness of breath and increased weight from volume retention. -Safe to resume the use of Cozaar as part of antihypertensive agents. -Continue to follow basic metabolic panel at follow-up visit to assess renal function trend/stability.  Generalized weakness -Generalized weakness.   -In the setting of physical deconditioning, recent diarrhea triggering dehydration and acute on chronic renal failure. -VQ scan negative for pulmonary embolism, CT chest without contrast demonstrating no acute cardiopulmonary artery process (but demonstrated the presence of emphysema). -Urinalysis negative for UTI. -Patient received fluid resuscitation and at time of discharge hemodynamically stable and ready to return to skilled nursing facility for further care and rehabilitation.   Dehydration -In the setting of continued diuretic usage and GI losses from recent diarrhea. -Diarrhea has subsided; patient fluid resuscitation provided with a stabilization of her volume. -As needed diuretics recommended at discharge. -Maintain adequate hydration and continue to follow clinical stability.  HTN (hypertension) -Overall stable -Resume home antihypertensive agents and follow heart healthy/low-sodium diet.  Stroke (HCC) -Stable and without new focal deficits. -Continue risk factor modification -Continue aspirin and statin for secondary prevention.  Chronic diastolic heart failure -Stable and compensated -Continue as needed Bumex for increased weight, edema and shortness of breath. -Continue to follow low-sodium/heart healthy diet and closely follow daily weights.   Consultants: None Procedures performed: See below for x-ray  reports. Disposition: Skilled nursing facility Diet recommendation: Heart healthy/low-sodium diet.  DISCHARGE MEDICATION: Allergies as of 04/27/2023       Reactions   Codeine Nausea Only, Other (See Comments)   Pass out   Sulfonamide Derivatives Rash        Medication List     STOP taking these medications    cephALEXin 500 MG capsule Commonly known as: KEFLEX   loperamide 2 MG capsule Commonly known as: IMODIUM   meloxicam 7.5 MG tablet Commonly known as: MOBIC   Pyridium 100 MG tablet Generic drug: phenazopyridine       TAKE these medications    acetaminophen 325 MG tablet Commonly known as: TYLENOL Take 650 mg by mouth every 6 (six) hours as needed for fever. What changed: Another medication with the same name was removed. Continue taking this medication, and follow the directions you see here.   aspirin 81 MG chewable tablet Chew 1 tablet (81 mg total) by mouth daily.   bumetanide 1 MG tablet Commonly known as: BUMEX Take 1 tablet (1 mg total) by mouth daily as needed (Edema, swelling and/or SOB). What changed:  when to take this reasons to take this   Cooling Pain Relief 4 % Gel Generic drug: Menthol (Topical Analgesic) Apply topically 3 (three) times daily. Apply topically to lower back, knees, or hands.   losartan 100 MG tablet Commonly known as: COZAAR Take 50 mg by mouth every morning.   pantoprazole 40 MG tablet Commonly known as: PROTONIX Take 40 mg by mouth daily.   polyethylene glycol 17 g packet Commonly known as: MIRALAX / GLYCOLAX Take 17 g by mouth daily as needed for moderate constipation.   potassium chloride 10 MEQ tablet Commonly known as: KLOR-CON Take 10 mEq by mouth daily. Take 1 tablet by mouth once daily with Bumex.   rosuvastatin 20 MG tablet Commonly known as: CRESTOR Take 20 mg by mouth at bedtime.   Sennosides-Docusate Sodium 8.6-50 MG Caps Take 1 capsule by mouth at bedtime as needed (constpation). Take 1  tablet by mouth at bedtime as needed.   traMADol 50 MG tablet Commonly known as: Ultram Take 1 tablet (50 mg total) by mouth daily as needed for severe pain. What changed:  when to take this reasons to take this   Vitamin D3 50 MCG (2000 UT) Tabs Take 1 tablet by mouth daily.        Follow-up Information     Cyndie Chime, [place] Ansel Bong, NP. Schedule an appointment as soon as possible for a visit in 10 day(s).   Specialty: Nurse Practitioner Contact information: [address] [place] [address] [phone]                Discharge Exam: Ceasar Mons Weights   04/26/23 1059  Weight: 50.8 kg   General exam: Afebrile, no chest pain, no shortness of breath. Respiratory system: Good saturation on room air; no using accessory muscle. Cardiovascular system: No rubs or gallops. Gastrointestinal system: Abdomen is nondistended, soft and nontender. No organomegaly or masses felt. Normal bowel sounds heard. Central nervous system: No focal neurological deficits. Extremities: No cyanosis or clubbing. Skin: No petechiae. Psychiatry: Stable mood; No hallucination.   Condition at discharge: Stable and improved.  The results of significant diagnostics from this hospitalization (including imaging, microbiology, ancillary and laboratory) are listed below for reference.   Imaging Studies: NM Pulmonary Perfusion  Result Date: 04/26/2023 CLINICAL DATA:  Pulmonary embolism (PE) suspected, low to intermediate prob, positive D-dimer  EXAM: NUCLEAR MEDICINE PERFUSION LUNG SCAN TECHNIQUE: Perfusion images were obtained in multiple projections after intravenous injection of radiopharmaceutical. Ventilation scans intentionally deferred if perfusion scan and chest x-ray adequate for interpretation during COVID 19 epidemic. RADIOPHARMACEUTICALS:  4.3 mCi Tc-65m MAA IV COMPARISON:  Radiograph and CT earlier today FINDINGS: Mildly heterogeneous pulmonary perfusion but no discrete wedge-shaped  perfusion defects. IMPRESSION: No scintigraphic evidence of pulmonary embolus. Electronically Signed   By: Narda Rutherford M.D.   On: 04/26/2023 18:27   CT Chest Wo Contrast  Result Date: 04/26/2023 CLINICAL DATA:  Respiratory illness. EXAM: CT CHEST WITHOUT CONTRAST TECHNIQUE: Multidetector CT imaging of the chest was performed following the standard protocol without IV contrast. RADIATION DOSE REDUCTION: This exam was performed according to the departmental dose-optimization program which includes automated exposure control, adjustment of the mA and/or kV according to patient size and/or use of iterative reconstruction technique. COMPARISON:  Chest radiograph from the same date. FINDINGS: Cardiovascular: Mildly enlarged heart. Minimal pericardial effusion. Heavy calcific atherosclerotic disease of the coronary arteries and aorta. Annular calcifications of the mitral and aortic valves. Mediastinum/Nodes: No enlarged mediastinal or axillary lymph nodes. Thyroid gland, trachea, and esophagus demonstrate no significant findings. Lungs/Pleura: Mild chronic interstitial changes and bronchiectasis. No focal lobar airspace consolidation. Minimal atelectasis in the lower lobes. Upper Abdomen: Too small to be accurately characterized hypoattenuated nodules throughout the liver parenchyma. Musculoskeletal: Spondylosis of the thoracic spine. IMPRESSION: 1. Mild chronic interstitial changes and bronchiectasis. 2. No focal lobar airspace consolidation. 3. Mildly enlarged heart. Minimal pericardial effusion. 4. Heavy calcific atherosclerotic disease of the coronary arteries and aorta. 5. Too small to be accurately characterized hypoattenuated nodules throughout the liver parenchyma. 6. Aortic atherosclerosis. Aortic Atherosclerosis (ICD10-I70.0). Electronically Signed   By: Ted Mcalpine M.D.   On: 04/26/2023 17:13   DG Chest Portable 1 View  Result Date: 04/26/2023 CLINICAL DATA:  Shortness of breath. EXAM:  PORTABLE CHEST 1 VIEW COMPARISON:  None Available. FINDINGS: The heart size and mediastinal contours are within normal limits. Aortic atherosclerotic calcification incidentally noted. Pulmonary hyperinflation again noted. Both lungs are clear. IMPRESSION: No active cardiopulmonary disease.  Probable COPD. Electronically Signed   By: Danae Orleans M.D.   On: 04/26/2023 11:49   DG Hip Unilat W or Wo Pelvis 2-3 Views Left  Result Date: 04/01/2023 CLINICAL DATA:  Pain after fall EXAM: DG HIP (WITH OR WITHOUT PELVIS) 3V LEFT COMPARISON:  None Available. FINDINGS: Osteopenia. Bilateral hip arthroplasties are identified with screw fixated acetabular cups and Press-Fit femoral components. The right femoral stem is not completely included in the imaging field. The left is included. No hardware failure. No fracture. Degenerative changes of the sacroiliac joints. Osteopenia. Hyperostosis. Advanced degenerative changes of the visualized lumbar spine. Scattered vascular calcifications. Presumed left-sided injection granulomas. IMPRESSION: Bilateral hip arthroplasties.  Osteopenia with degenerative changes. Electronically Signed   By: Karen Kays M.D.   On: 04/01/2023 13:25   CT HEAD WO CONTRAST ( )  Result Date: 04/01/2023 CLINICAL DATA:  Head trauma, minor (Age >= 65y); Neck trauma (Age >= 65y). EXAM: CT HEAD WITHOUT CONTRAST CT CERVICAL SPINE WITHOUT CONTRAST TECHNIQUE: Multidetector CT imaging of the head and cervical spine was performed following the standard protocol without intravenous contrast. Multiplanar CT image reconstructions of the cervical spine were also generated. RADIATION DOSE REDUCTION: This exam was performed according to the departmental dose-optimization program which includes automated exposure control, adjustment of the mA and/or kV according to patient size and/or use of iterative reconstruction technique. COMPARISON:  Head  CT 03/18/2022. MRI brain 03/20/2022. CT cervical spine 05/04/2021.  FINDINGS: CT HEAD FINDINGS Brain: No acute intracranial hemorrhage. Unchanged old lacunar infarcts in the right corona radiata and left cerebellar hemisphere. Unchanged mild chronic small-vessel disease. No hydrocephalus or extra-axial collection. No mass effect or midline shift. Vascular: No hyperdense vessel or unexpected calcification. Skull: No calvarial fracture or suspicious bone lesion. Skull base is unremarkable. Sinuses/Orbits: Unremarkable. Other: None. CT CERVICAL SPINE FINDINGS Alignment: Unchanged 3 mm degenerative anterolisthesis of C3 on C4 and C4 on C5. No traumatic malalignment. Skull base and vertebrae: No acute fracture. Intact craniocervical junction. Soft tissues and spinal canal: No prevertebral fluid or swelling. No visible canal hematoma. Disc levels: Multilevel cervical spondylosis, worst at C4-5, where there is at least moderate spinal canal stenosis. Upper chest: Unremarkable. Other: Atherosclerotic calcifications of the carotid bulbs. IMPRESSION: 1. No acute intracranial abnormality. Unchanged old lacunar infarcts in the right corona radiata and left cerebellar hemisphere. 2. No acute cervical spine fracture. Multilevel cervical spondylosis, worst at C4-5 where there is at least moderate spinal canal stenosis. Electronically Signed   By: Orvan Falconer M.D.   On: 04/01/2023 12:25   CT Cervical Spine Wo Contrast  Result Date: 04/01/2023 CLINICAL DATA:  Head trauma, minor (Age >= 65y); Neck trauma (Age >= 65y). EXAM: CT HEAD WITHOUT CONTRAST CT CERVICAL SPINE WITHOUT CONTRAST TECHNIQUE: Multidetector CT imaging of the head and cervical spine was performed following the standard protocol without intravenous contrast. Multiplanar CT image reconstructions of the cervical spine were also generated. RADIATION DOSE REDUCTION: This exam was performed according to the departmental dose-optimization program which includes automated exposure control, adjustment of the mA and/or kV according to  patient size and/or use of iterative reconstruction technique. COMPARISON:  Head CT 03/18/2022. MRI brain 03/20/2022. CT cervical spine 05/04/2021. FINDINGS: CT HEAD FINDINGS Brain: No acute intracranial hemorrhage. Unchanged old lacunar infarcts in the right corona radiata and left cerebellar hemisphere. Unchanged mild chronic small-vessel disease. No hydrocephalus or extra-axial collection. No mass effect or midline shift. Vascular: No hyperdense vessel or unexpected calcification. Skull: No calvarial fracture or suspicious bone lesion. Skull base is unremarkable. Sinuses/Orbits: Unremarkable. Other: None. CT CERVICAL SPINE FINDINGS Alignment: Unchanged 3 mm degenerative anterolisthesis of C3 on C4 and C4 on C5. No traumatic malalignment. Skull base and vertebrae: No acute fracture. Intact craniocervical junction. Soft tissues and spinal canal: No prevertebral fluid or swelling. No visible canal hematoma. Disc levels: Multilevel cervical spondylosis, worst at C4-5, where there is at least moderate spinal canal stenosis. Upper chest: Unremarkable. Other: Atherosclerotic calcifications of the carotid bulbs. IMPRESSION: 1. No acute intracranial abnormality. Unchanged old lacunar infarcts in the right corona radiata and left cerebellar hemisphere. 2. No acute cervical spine fracture. Multilevel cervical spondylosis, worst at C4-5 where there is at least moderate spinal canal stenosis. Electronically Signed   By: Orvan Falconer M.D.   On: 04/01/2023 12:25   DG Chest Portable 1 View  Result Date: 04/01/2023 CLINICAL DATA:  Fall EXAM: PORTABLE CHEST 1 VIEW COMPARISON:  None Available. FINDINGS: No pleural effusion. No pneumothorax. No focal airspace opacity. Normal cardiac and mediastinal contours. No radiographically apparent displaced rib fractures. Visualized upper abdomen is unremarkable. IMPRESSION: No focal airspace opacity. Electronically Signed   By: Lorenza Cambridge M.D.   On: 04/01/2023 12:15     Microbiology: Results for orders placed or performed during the hospital encounter of 04/26/23  Resp panel by RT-PCR (RSV, Flu A&B, Covid) Anterior Nasal Swab     Status: None  Collection Time: 04/26/23 11:45 AM   Specimen: Anterior Nasal Swab  Result Value Ref Range Status   SARS Coronavirus 2 by RT PCR NEGATIVE NEGATIVE Final    Comment: (NOTE) SARS-CoV-2 target nucleic acids are NOT DETECTED.  The SARS-CoV-2 RNA is generally detectable in upper respiratory specimens during the acute phase of infection. The lowest concentration of SARS-CoV-2 viral copies this assay can detect is 138 copies/mL. A negative result does not preclude SARS-Cov-2 infection and should not be used as the sole basis for treatment or other patient management decisions. A negative result may occur with  improper specimen collection/handling, submission of specimen other than nasopharyngeal swab, presence of viral mutation(s) within the areas targeted by this assay, and inadequate number of viral copies(<138 copies/mL). A negative result must be combined with clinical observations, patient history, and epidemiological information. The expected result is Negative.  Fact Sheet for Patients:  BloggerCourse.com  Fact Sheet for Healthcare Providers:  SeriousBroker.it  This test is no t yet approved or cleared by the [place] FDA and  has been authorized for detection and/or diagnosis of SARS-CoV-2 by FDA under an Emergency Use Authorization (EUA). This EUA will remain  in effect (meaning this test can be used) for the duration of the COVID-19 declaration under Section 564(b)(1) of the Act, 21 U.S.C.section 360bbb-3(b)(1), unless the authorization is terminated  or revoked sooner.       Influenza A by PCR NEGATIVE NEGATIVE Final   Influenza B by PCR NEGATIVE NEGATIVE Final    Comment: (NOTE) The Xpert Xpress SARS-CoV-2/FLU/RSV plus assay is intended  as an aid in the diagnosis of influenza from Nasopharyngeal swab specimens and should not be used as a sole basis for treatment. Nasal washings and aspirates are unacceptable for Xpert Xpress SARS-CoV-2/FLU/RSV testing.  Fact Sheet for Patients: BloggerCourse.com  Fact Sheet for Healthcare Providers: SeriousBroker.it  This test is not yet approved or cleared by the [place] FDA and has been authorized for detection and/or diagnosis of SARS-CoV-2 by FDA under an Emergency Use Authorization (EUA). This EUA will remain in effect (meaning this test can be used) for the duration of the COVID-19 declaration under Section 564(b)(1) of the Act, 21 U.S.C. section 360bbb-3(b)(1), unless the authorization is terminated or revoked.     Resp Syncytial Virus by PCR NEGATIVE NEGATIVE Final    Comment: (NOTE) Fact Sheet for Patients: BloggerCourse.com  Fact Sheet for Healthcare Providers: SeriousBroker.it  This test is not yet approved or cleared by the [place] FDA and has been authorized for detection and/or diagnosis of SARS-CoV-2 by FDA under an Emergency Use Authorization (EUA). This EUA will remain in effect (meaning this test can be used) for the duration of the COVID-19 declaration under Section 564(b)(1) of the Act, 21 U.S.C. section 360bbb-3(b)(1), unless the authorization is terminated or revoked.  Performed at [facility], [address] [place], [place] [address]     Labs: CBC: Recent Labs  Lab 04/26/23 1150  WBC 4.6  NEUTROABS 3.6  HGB 11.8*  HCT 36.5  MCV 99.5  PLT 114*   Basic Metabolic Panel: Recent Labs  Lab 04/26/23 1150 04/27/23 0445  NA 139 137  K 4.4 3.9  CL 103 105  CO2 23 17*  GLUCOSE 109* 128*  BUN 32* 27*  CREATININE 1.70* 1.40*  CALCIUM 8.9 8.6*  MG 2.3  --    Liver Function Tests: Recent Labs  Lab 04/26/23 1150  AST 31  ALT  18  ALKPHOS 64  BILITOT  1.5*  PROT 6.8  ALBUMIN 3.6   CBG: No results for input(s): "GLUCAP" in the last 168 hours.  Discharge time spent: greater than 30 minutes.  Signed: Vassie Loll, MD [facility] 04/27/2023

## 2023-04-27 NOTE — [place] FL2 (Signed)
[place] MEDICAID FL2 LEVEL OF CARE FORM     IDENTIFICATION  Patient Name: Carrie Sanders Birthdate: 1927/11/18 Sex: female Admission Date (Current Location): 04/26/2023  [facility] and [place] Number:  [employer] and Address:  [facility],  [address] [place] [address]      Provider Number: [id number]  Attending Physician Name and Address:  Vassie Loll, MD  Relative Name and Phone Number:       Current Level of Care: Hospital Recommended Level of Care: Assisted Living Facility Prior Approval Number:    Date Approved/Denied:   PASRR Number:    Discharge Plan: Other (Comment) (ALF)    Current Diagnoses: Patient Active Problem List   Diagnosis Date Noted   Dehydration 04/27/2023   AKI (acute kidney injury) (HCC) 04/26/2023   Generalized weakness 04/26/2023   Ischemic stroke (HCC) 07/20/2022   Hypokalemia 07/19/2022   Stroke-like symptoms 07/19/2022   Stroke (HCC) 07/18/2022   HTN (hypertension) 07/18/2022   HLD (hyperlipidemia) 07/18/2022   GERD (gastroesophageal reflux disease) 07/18/2022   Spinal stenosis at L4-L5 level 07/24/2016   Paresthesia 05/26/2016   Gait disorder 05/26/2016   S/P Left THA, AA 03/17/2012   KNEE, ARTHRITIS, DEGEN./OSTEO 05/23/2009   KNEE PAIN 05/23/2009   ANSERINE BURSITIS, LEFT 05/23/2009   TRIGGER FINGER 05/23/2009   HAND PAIN 05/23/2009   HIGH BLOOD PRESSURE 05/23/2009    Orientation RESPIRATION BLADDER Height & Weight     Self, Time, Situation, Place  Normal Continent Weight: 112 lb (50.8 kg) Height:  5\' 1"  (154.9 cm)  BEHAVIORAL SYMPTOMS/MOOD NEUROLOGICAL BOWEL NUTRITION STATUS      Continent Diet (No added salt)  AMBULATORY STATUS COMMUNICATION OF NEEDS Skin   Limited Assist Verbally Normal                       Personal Care Assistance Level of Assistance  Bathing, Feeding, Dressing Bathing Assistance: Limited assistance Feeding assistance: Independent Dressing Assistance: Limited  assistance     Functional Limitations Info  Sight, Hearing, Speech Sight Info: Adequate Hearing Info: Impaired Speech Info: Adequate    SPECIAL CARE FACTORS FREQUENCY                       Contractures Contractures Info: Not present    Additional Factors Info  Code Status, Allergies Code Status Info: DNR Allergies Info: Codeine, Sulfonamide Derivatives           Current Medications (04/27/2023):  This is the current hospital active medication list Current Facility-Administered Medications  Medication Dose Route Frequency Provider Last Rate Last Admin   0.9 %  sodium chloride infusion   Intravenous Continuous Emokpae, Ejiroghene E, MD   Stopped at 04/27/23 1015   acetaminophen (TYLENOL) tablet 650 mg  650 mg Oral Q6H PRN Emokpae, Ejiroghene E, MD   650 mg at 04/27/23 0258   Or   acetaminophen (TYLENOL) suppository 650 mg  650 mg Rectal Q6H PRN Emokpae, Ejiroghene E, MD       aspirin chewable tablet 81 mg  81 mg Oral Daily Emokpae, Ejiroghene E, MD   81 mg at 04/27/23 1028   heparin injection 5,000 Units  5,000 Units Subcutaneous Q8H Emokpae, Ejiroghene E, MD   5,000 Units at 04/27/23 0616   ipratropium-albuterol (DUONEB) 0.5-2.5 (3) MG/3ML nebulizer solution 3 mL  3 mL Nebulization Q6H PRN Emokpae, Ejiroghene E, MD       ondansetron (ZOFRAN) tablet 4 mg  4  mg Oral Q6H PRN Emokpae, Ejiroghene E, MD       Or   ondansetron (ZOFRAN) injection 4 mg  4 mg Intravenous Q6H PRN Emokpae, Ejiroghene E, MD       pantoprazole (PROTONIX) EC tablet 40 mg  40 mg Oral Daily Emokpae, Ejiroghene E, MD   40 mg at 04/27/23 1028   polyethylene glycol (MIRALAX / GLYCOLAX) packet 17 g  17 g Oral Daily PRN Emokpae, Ejiroghene E, MD       rosuvastatin (CRESTOR) tablet 20 mg  20 mg Oral QHS Emokpae, Ejiroghene E, MD         Discharge Medications:   STOP taking these medications     cephALEXin 500 MG capsule Commonly known as: KEFLEX    loperamide 2 MG capsule Commonly known as:  IMODIUM    meloxicam 7.5 MG tablet Commonly known as: MOBIC    Pyridium 100 MG tablet Generic drug: phenazopyridine           TAKE these medications     acetaminophen 325 MG tablet Commonly known as: TYLENOL Take 650 mg by mouth every 6 (six) hours as needed for fever. What changed: Another medication with the same name was removed. Continue taking this medication, and follow the directions you see here.    aspirin 81 MG chewable tablet Chew 1 tablet (81 mg total) by mouth daily.    bumetanide 1 MG tablet Commonly known as: BUMEX Take 1 tablet (1 mg total) by mouth daily as needed (Edema, swelling and/or SOB). What changed:  when to take this reasons to take this    Cooling Pain Relief 4 % Gel Generic drug: Menthol (Topical Analgesic) Apply topically 3 (three) times daily. Apply topically to lower back, knees, or hands.    losartan 100 MG tablet Commonly known as: COZAAR Take 50 mg by mouth every morning.    pantoprazole 40 MG tablet Commonly known as: PROTONIX Take 40 mg by mouth daily.    polyethylene glycol 17 g packet Commonly known as: MIRALAX / GLYCOLAX Take 17 g by mouth daily as needed for moderate constipation.    potassium chloride 10 MEQ tablet Commonly known as: KLOR-CON Take 10 mEq by mouth daily. Take 1 tablet by mouth once daily with Bumex.    rosuvastatin 20 MG tablet Commonly known as: CRESTOR Take 20 mg by mouth at bedtime.    Sennosides-Docusate Sodium 8.6-50 MG Caps Take 1 capsule by mouth at bedtime as needed (constpation). Take 1 tablet by mouth at bedtime as needed.    traMADol 50 MG tablet Commonly known as: Ultram Take 1 tablet (50 mg total) by mouth daily as needed for severe pain. What changed:  when to take this reasons to take this    Vitamin D3 50 MCG (2000 UT) Tabs Take 1 tablet by mouth daily.     Relevant Imaging Results:  Relevant Lab Results:   Additional Information Continue PT  Elliot Gault,  LCSW

## 2023-04-27 NOTE — Assessment & Plan Note (Signed)
-  In the setting of continued diuretic usage and GI losses from recent diarrhea. -Diarrhea has subsided; patient fluid resuscitation provided with a stabilization of her volume. -As needed diuretics recommended at discharge. -Maintain adequate hydration and continue to follow clinical stability.

## 2023-04-29 DIAGNOSIS — M48062 Spinal stenosis, lumbar region with neurogenic claudication: Secondary | ICD-10-CM | POA: Diagnosis not present

## 2023-04-29 DIAGNOSIS — M6281 Muscle weakness (generalized): Secondary | ICD-10-CM | POA: Diagnosis not present

## 2023-04-29 DIAGNOSIS — R262 Difficulty in walking, not elsewhere classified: Secondary | ICD-10-CM | POA: Diagnosis not present

## 2023-04-29 DIAGNOSIS — I10 Essential (primary) hypertension: Secondary | ICD-10-CM | POA: Diagnosis not present

## 2023-04-29 DIAGNOSIS — G629 Polyneuropathy, unspecified: Secondary | ICD-10-CM | POA: Diagnosis not present

## 2023-04-30 ENCOUNTER — Encounter (HOSPITAL_COMMUNITY): Payer: Self-pay | Admitting: Emergency Medicine

## 2023-04-30 ENCOUNTER — Other Ambulatory Visit: Payer: Self-pay

## 2023-04-30 ENCOUNTER — Observation Stay (HOSPITAL_COMMUNITY)
Admission: EM | Admit: 2023-04-30 | Discharge: 2023-05-01 | Disposition: A | Discharge status: Skilled Nursing Facility | Payer: Medicare Other | Attending: Internal Medicine | Admitting: Internal Medicine

## 2023-04-30 ENCOUNTER — Emergency Department (HOSPITAL_COMMUNITY): Payer: Medicare Other

## 2023-04-30 DIAGNOSIS — R059 Cough, unspecified: Secondary | ICD-10-CM | POA: Diagnosis not present

## 2023-04-30 DIAGNOSIS — Z96643 Presence of artificial hip joint, bilateral: Secondary | ICD-10-CM | POA: Insufficient documentation

## 2023-04-30 DIAGNOSIS — K219 Gastro-esophageal reflux disease without esophagitis: Secondary | ICD-10-CM | POA: Diagnosis not present

## 2023-04-30 DIAGNOSIS — I639 Cerebral infarction, unspecified: Secondary | ICD-10-CM | POA: Diagnosis present

## 2023-04-30 DIAGNOSIS — Z79899 Other long term (current) drug therapy: Secondary | ICD-10-CM | POA: Insufficient documentation

## 2023-04-30 DIAGNOSIS — Z87891 Personal history of nicotine dependence: Secondary | ICD-10-CM | POA: Diagnosis not present

## 2023-04-30 DIAGNOSIS — I10 Essential (primary) hypertension: Secondary | ICD-10-CM | POA: Diagnosis present

## 2023-04-30 DIAGNOSIS — R0602 Shortness of breath: Secondary | ICD-10-CM | POA: Diagnosis not present

## 2023-04-30 DIAGNOSIS — Z7982 Long term (current) use of aspirin: Secondary | ICD-10-CM | POA: Insufficient documentation

## 2023-04-30 DIAGNOSIS — I110 Hypertensive heart disease with heart failure: Secondary | ICD-10-CM | POA: Insufficient documentation

## 2023-04-30 DIAGNOSIS — Z8673 Personal history of transient ischemic attack (TIA), and cerebral infarction without residual deficits: Secondary | ICD-10-CM | POA: Insufficient documentation

## 2023-04-30 DIAGNOSIS — R531 Weakness: Secondary | ICD-10-CM

## 2023-04-30 DIAGNOSIS — I5032 Chronic diastolic (congestive) heart failure: Secondary | ICD-10-CM | POA: Insufficient documentation

## 2023-04-30 DIAGNOSIS — N179 Acute kidney failure, unspecified: Principal | ICD-10-CM | POA: Insufficient documentation

## 2023-04-30 DIAGNOSIS — Z1152 Encounter for screening for COVID-19: Secondary | ICD-10-CM | POA: Diagnosis not present

## 2023-04-30 DIAGNOSIS — E860 Dehydration: Secondary | ICD-10-CM | POA: Diagnosis not present

## 2023-04-30 DIAGNOSIS — J209 Acute bronchitis, unspecified: Secondary | ICD-10-CM | POA: Diagnosis not present

## 2023-04-30 LAB — URINALYSIS, W/ REFLEX TO CULTURE (INFECTION SUSPECTED)
Bacteria, UA: NONE SEEN
Bilirubin Urine: NEGATIVE
Glucose, UA: NEGATIVE mg/dL
Hgb urine dipstick: NEGATIVE
Ketones, ur: NEGATIVE mg/dL
Leukocytes,Ua: NEGATIVE
Nitrite: NEGATIVE
Protein, ur: 30 mg/dL — AB
Specific Gravity, Urine: 1.006 (ref 1.005–1.030)
pH: 7 (ref 5.0–8.0)

## 2023-04-30 LAB — CBC WITH DIFFERENTIAL/PLATELET
Abs Immature Granulocytes: 0.02 10*3/uL (ref 0.00–0.07)
Basophils Absolute: 0 10*3/uL (ref 0.0–0.1)
Basophils Relative: 1 %
Eosinophils Absolute: 0 10*3/uL (ref 0.0–0.5)
Eosinophils Relative: 1 %
HCT: 35.3 % — ABNORMAL LOW (ref 36.0–46.0)
Hemoglobin: 11.4 g/dL — ABNORMAL LOW (ref 12.0–15.0)
Immature Granulocytes: 1 %
Lymphocytes Relative: 17 %
Lymphs Abs: 0.7 10*3/uL (ref 0.7–4.0)
MCH: 32 pg (ref 26.0–34.0)
MCHC: 32.3 g/dL (ref 30.0–36.0)
MCV: 99.2 fL (ref 80.0–100.0)
Monocytes Absolute: 0.3 10*3/uL (ref 0.1–1.0)
Monocytes Relative: 8 %
Neutro Abs: 3 10*3/uL (ref 1.7–7.7)
Neutrophils Relative %: 72 %
Platelets: 129 10*3/uL — ABNORMAL LOW (ref 150–400)
RBC: 3.56 MIL/uL — ABNORMAL LOW (ref 3.87–5.11)
RDW: 13.5 % (ref 11.5–15.5)
WBC: 4.1 10*3/uL (ref 4.0–10.5)
nRBC: 0 % (ref 0.0–0.2)

## 2023-04-30 LAB — COMPREHENSIVE METABOLIC PANEL
ALT: 25 U/L (ref 0–44)
AST: 33 U/L (ref 15–41)
Albumin: 3.6 g/dL (ref 3.5–5.0)
Alkaline Phosphatase: 62 U/L (ref 38–126)
Anion gap: 9 (ref 5–15)
BUN: 24 mg/dL — ABNORMAL HIGH (ref 8–23)
CO2: 22 mmol/L (ref 22–32)
Calcium: 9.3 mg/dL (ref 8.9–10.3)
Chloride: 106 mmol/L (ref 98–111)
Creatinine, Ser: 1.42 mg/dL — ABNORMAL HIGH (ref 0.44–1.00)
GFR, Estimated: 34 mL/min — ABNORMAL LOW (ref >60)
Glucose, Bld: 98 mg/dL (ref 70–99)
Potassium: 4.3 mmol/L (ref 3.5–5.1)
Sodium: 137 mmol/L (ref 135–145)
Total Bilirubin: 1.4 mg/dL — ABNORMAL HIGH (ref 0.3–1.2)
Total Protein: 6.7 g/dL (ref 6.5–8.1)

## 2023-04-30 LAB — LACTIC ACID, PLASMA
Lactic Acid, Venous: 0.9 mmol/L (ref 0.5–1.9)
Lactic Acid, Venous: 0.9 mmol/L (ref 0.5–1.9)

## 2023-04-30 LAB — CULTURE, BLOOD (ROUTINE X 2)

## 2023-04-30 LAB — PROTIME-INR
INR: 1 (ref 0.8–1.2)
Prothrombin Time: 13.6 seconds (ref 11.4–15.2)

## 2023-04-30 LAB — SARS CORONAVIRUS 2 BY RT PCR: SARS Coronavirus 2 by RT PCR: NEGATIVE

## 2023-04-30 LAB — APTT: aPTT: 24 seconds (ref 24–36)

## 2023-04-30 MED ORDER — ROSUVASTATIN CALCIUM 20 MG PO TABS
20.0000 mg | ORAL_TABLET | Freq: Every day | ORAL | Status: DC
  Administered 2023-04-30: 20 mg via ORAL
  Filled 2023-04-30: qty 1

## 2023-04-30 MED ORDER — ONDANSETRON HCL 4 MG PO TABS: 4.0000 mg | ORAL_TABLET | Freq: Four times a day (QID) | ORAL | Status: DC | PRN

## 2023-04-30 MED ORDER — ASPIRIN 81 MG PO CHEW
81.0000 mg | CHEWABLE_TABLET | Freq: Every day | ORAL | Status: DC
  Administered 2023-04-30 – 2023-05-01 (×2): 81 mg via ORAL
  Filled 2023-04-30 (×2): qty 1

## 2023-04-30 MED ORDER — ALBUTEROL SULFATE HFA 108 (90 BASE) MCG/ACT IN AERS: 2.0000 | INHALATION_SPRAY | RESPIRATORY_TRACT | Status: DC

## 2023-04-30 MED ORDER — LACTATED RINGERS IV SOLN: INTRAVENOUS | Status: AC

## 2023-04-30 MED ORDER — LACTATED RINGERS IV BOLUS (SEPSIS)
500.0000 mL | Freq: Once | INTRAVENOUS | Status: AC
  Administered 2023-04-30: 500 mL via INTRAVENOUS

## 2023-04-30 MED ORDER — ENOXAPARIN SODIUM 30 MG/0.3ML IJ SOSY
30.0000 mg | PREFILLED_SYRINGE | INTRAMUSCULAR | Status: DC
  Administered 2023-04-30: 30 mg via SUBCUTANEOUS
  Filled 2023-04-30: qty 0.3

## 2023-04-30 MED ORDER — LACTATED RINGERS IV BOLUS
500.0000 mL | Freq: Once | INTRAVENOUS | Status: AC
  Administered 2023-04-30: 500 mL via INTRAVENOUS

## 2023-04-30 MED ORDER — LOSARTAN POTASSIUM 50 MG PO TABS
50.0000 mg | ORAL_TABLET | Freq: Every morning | ORAL | Status: DC
  Administered 2023-05-01: 50 mg via ORAL
  Filled 2023-04-30: qty 1

## 2023-04-30 MED ORDER — ONDANSETRON HCL 4 MG/2ML IJ SOLN: 4.0000 mg | Freq: Four times a day (QID) | INTRAMUSCULAR | Status: DC | PRN

## 2023-04-30 MED ORDER — TRAMADOL HCL 50 MG PO TABS
50.0000 mg | ORAL_TABLET | Freq: Every day | ORAL | Status: DC | PRN
  Administered 2023-04-30: 50 mg via ORAL
  Filled 2023-04-30: qty 1

## 2023-04-30 MED ORDER — PANTOPRAZOLE SODIUM 40 MG PO TBEC
40.0000 mg | DELAYED_RELEASE_TABLET | Freq: Every day | ORAL | Status: DC
  Administered 2023-05-01: 40 mg via ORAL
  Filled 2023-04-30: qty 1

## 2023-04-30 MED ORDER — ALBUTEROL SULFATE (2.5 MG/3ML) 0.083% IN NEBU
2.5000 mg | INHALATION_SOLUTION | RESPIRATORY_TRACT | Status: DC
  Administered 2023-04-30 (×2): 2.5 mg via RESPIRATORY_TRACT
  Filled 2023-04-30 (×2): qty 3

## 2023-04-30 MED ORDER — PREDNISONE 20 MG PO TABS
40.0000 mg | ORAL_TABLET | Freq: Every day | ORAL | Status: DC
  Administered 2023-04-30 – 2023-05-01 (×2): 40 mg via ORAL
  Filled 2023-04-30 (×2): qty 2

## 2023-04-30 MED ORDER — IPRATROPIUM BROMIDE 0.02 % IN SOLN
RESPIRATORY_TRACT | Status: AC
  Administered 2023-04-30: 0.5 mg
  Filled 2023-04-30: qty 2.5

## 2023-04-30 MED ORDER — ENSURE ENLIVE PO LIQD
237.0000 mL | Freq: Two times a day (BID) | ORAL | Status: DC
  Administered 2023-05-01: 237 mL via ORAL

## 2023-04-30 NOTE — H&P (Signed)
History and Physical    Patient: Carrie Sanders:[id number] DOB: May 15, 1927 DOA: 04/30/2023 DOS: the patient was seen and examined on 04/30/2023 PCP: Sunday Spillers, NP  Patient coming from: Home  Chief Complaint:  Chief Complaint  Patient presents with   Cough   HPI: Carrie Sanders is a 89 y.o. female with medical history significant of HTN, hyperlipidemia, history of stroke.  Patient was recently observed in the hospital overnight due to weakness and difficulty breathing. She was found to have an AKI secondary to prerenal azotemia.  She was given IV fluids and discharged the following day.  Today, she continues to be weak and now presents with a cough and shortness of breath.  She does produce yellow sputum.  She reports that multiple patients at the facility where she lives had been diagnosed with pneumonia.  She reports no fevers, chills, nausea, vomiting.  She does have decreased appetite and soft stools, although no frank diarrhea.  She has had minimal oral intake due to lack of appetite.  She has been unable to really support herself or stand.  Review of Systems: As mentioned in the history of present illness. All other systems reviewed and are negative. Past Medical History:  Diagnosis Date   Arthritis    Constipation    Gait disorder 05/26/2016   H/O left breast biopsy    High cholesterol    Hypertension    Paresthesia 05/26/2016   Pessary maintenance    Sinus drainage    Sleeping difficulty    Past Surgical History:  Procedure Laterality Date   APPENDECTOMY     AGE 85   CATARACTS REMOVED     JOINT REPLACEMENT  1998   RT TOTAL HIP   TOTAL HIP ARTHROPLASTY  03/17/2012   Procedure: TOTAL HIP ARTHROPLASTY ANTERIOR APPROACH;  Surgeon: Shelda Pal, MD;  Location: [facility];  Service: Orthopedics;  Laterality: Left;   Social History:  reports that she quit smoking about 64 years ago. Her smoking use included cigarettes. She has never used smokeless tobacco. She reports  that she does not drink alcohol and does not use drugs.  Allergies  Allergen Reactions   Codeine Nausea Only and Other (See Comments)    Pass out    Sulfonamide Derivatives Rash    Family History  Problem Relation Age of Onset   Heart disease Brother    Stroke Mother    Cerebral aneurysm Sister     Prior to Admission medications   Medication Sig Start Date End Date Taking? Authorizing Provider  acetaminophen (TYLENOL) 325 MG tablet Take 650 mg by mouth every 6 (six) hours as needed for fever, mild pain or moderate pain.   Yes [provider]  acetaminophen (TYLENOL) 650 MG suppository Place 650 mg rectally every 4 (four) hours as needed for fever (chills).   Yes [provider]  aspirin 81 MG chewable tablet Chew 1 tablet (81 mg total) by mouth daily. 07/23/22  Yes Gherghe, Daylene Katayama, MD  bumetanide (BUMEX) 1 MG tablet Take 1 tablet (1 mg total) by mouth daily as needed (Edema, swelling and/or SOB). 04/27/23  Yes Vassie Loll, MD  Cholecalciferol (VITAMIN D3) 50 MCG (2000 UT) TABS Take 2,000 Units by mouth daily.   Yes [provider]  losartan (COZAAR) 50 MG tablet Take 50 mg by mouth every morning. 03/22/23  Yes [provider]  Menthol, Topical Analgesic, (COOLING PAIN RELIEF) 4 % GEL Apply 1 application  topically 3 (three)  times daily as needed (pain). Apply topically to lower back, knees, or hands.   Yes [provider]  pantoprazole (PROTONIX) 40 MG tablet Take 40 mg by mouth daily.   Yes [provider]  polyethylene glycol (MIRALAX / GLYCOLAX) 17 g packet Take 17 g by mouth daily as needed for moderate constipation. 04/27/23  Yes Vassie Loll, MD  potassium chloride (KLOR-CON) 10 MEQ tablet Take 10 mEq by mouth daily. Take 1 tablet by mouth once daily with Bumex.   Yes [provider]  rosuvastatin (CRESTOR) 20 MG tablet Take 20 mg by mouth at bedtime.   Yes [provider]  Sennosides-Docusate Sodium 8.6-50  MG CAPS Take 1 capsule by mouth at bedtime as needed (constpation).   Yes [provider]  traMADol (ULTRAM) 50 MG tablet Take 1 tablet (50 mg total) by mouth daily as needed for severe pain. 04/27/23 04/26/24 Yes Vassie Loll, MD    Physical Exam: Vitals:   04/30/23 1530 04/30/23 1630 04/30/23 1639 04/30/23 1645  BP: (!) 161/92 (!) 115/93    Pulse: 99   99  Resp: 17 16  18   Temp:   97.8 F (36.6 C)   TempSrc:   Oral   SpO2: 94%  94% 95%  Weight:      Height:       General: elderly female. Awake and alert and oriented x3. No acute cardiopulmonary distress.  HEENT: Normocephalic atraumatic.  Right and left ears normal in appearance.  Pupils equal, round, reactive to light. Extraocular muscles are intact. Sclerae anicteric and noninjected.  Moist mucosal membranes. No mucosal lesions.  Neck: Neck supple without lymphadenopathy. No carotid bruits. No masses palpated.  Cardiovascular: Regular rate with normal S1-S2 sounds. No murmurs, rubs, gallops auscultated. No JVD.  Respiratory: Expiratory wheezing throughout.  Patient also exhibits a wheezy cough. No accessory muscle use. Abdomen: Soft, nontender, nondistended. Active bowel sounds. No masses or hepatosplenomegaly  Skin: No rashes, lesions, or ulcerations.  Dry, warm to touch. 2+ dorsalis pedis and radial pulses. Musculoskeletal: No calf or leg pain. All major joints not erythematous nontender.  No upper or lower joint deformation.  Good ROM.  No contractures  Psychiatric: Intact judgment and insight. Pleasant and cooperative. Neurologic: No focal neurological deficits. Strength is 5/5 and symmetric in upper and lower extremities.  Cranial nerves II through XII are grossly intact.   Data Reviewed: Results for orders placed or performed during the hospital encounter of 04/30/23 (from the past 24 hour(s))  Lactic acid, plasma     Status: None   Collection Time: 04/30/23  8:50 AM  Result Value Ref Range   Lactic Acid, Venous  0.9 0.5 - 1.9 mmol/L  Comprehensive metabolic panel     Status: Abnormal   Collection Time: 04/30/23  8:50 AM  Result Value Ref Range   Sodium 137 135 - 145 mmol/L   Potassium 4.3 3.5 - 5.1 mmol/L   Chloride 106 98 - 111 mmol/L   CO2 22 22 - 32 mmol/L   Glucose, Bld 98 70 - 99 mg/dL   BUN 24 (H) 8 - 23 mg/dL   Creatinine, Ser 1.61 (H) 0.44 - 1.00 mg/dL   Calcium 9.3 8.9 - 09.6 mg/dL   Total Protein 6.7 6.5 - 8.1 g/dL   Albumin 3.6 3.5 - 5.0 g/dL   AST 33 15 - 41 U/L   ALT 25 0 - 44 U/L   Alkaline Phosphatase 62 38 - 126 U/L   Total Bilirubin 1.4 (  H) 0.3 - 1.2 mg/dL   GFR, Estimated 34 (L) >60 mL/min   Anion gap 9 5 - 15  CBC with Differential     Status: Abnormal   Collection Time: 04/30/23  8:50 AM  Result Value Ref Range   WBC 4.1 4.0 - 10.5 K/uL   RBC 3.56 (L) 3.87 - 5.11 MIL/uL   Hemoglobin 11.4 (L) 12.0 - 15.0 g/dL   HCT 16.1 (L) 09.6 - 04.5 %   MCV 99.2 80.0 - 100.0 fL   MCH 32.0 26.0 - 34.0 pg   MCHC 32.3 30.0 - 36.0 g/dL   RDW 40.9 81.1 - 91.4 %   Platelets 129 (L) 150 - 400 K/uL   nRBC 0.0 0.0 - 0.2 %   Neutrophils Relative % 72 %   Neutro Abs 3.0 1.7 - 7.7 K/uL   Lymphocytes Relative 17 %   Lymphs Abs 0.7 0.7 - 4.0 K/uL   Monocytes Relative 8 %   Monocytes Absolute 0.3 0.1 - 1.0 K/uL   Eosinophils Relative 1 %   Eosinophils Absolute 0.0 0.0 - 0.5 K/uL   Basophils Relative 1 %   Basophils Absolute 0.0 0.0 - 0.1 K/uL   Immature Granulocytes 1 %   Abs Immature Granulocytes 0.02 0.00 - 0.07 K/uL  Protime-INR     Status: None   Collection Time: 04/30/23  8:50 AM  Result Value Ref Range   Prothrombin Time 13.6 11.4 - 15.2 seconds   INR 1.0 0.8 - 1.2  APTT     Status: None   Collection Time: 04/30/23  8:50 AM  Result Value Ref Range   aPTT 24 24 - 36 seconds  Blood Culture (routine x 2)     Status: None (Preliminary result)   Collection Time: 04/30/23  9:00 AM   Specimen: Blood  Result Value Ref Range   Specimen Description      RIGHT ANTECUBITAL BOTTLES  DRAWN AEROBIC AND ANAEROBIC   Special Requests      Blood Culture results may not be optimal due to an inadequate volume of blood received in culture bottles Performed at [facility], [address] [place], [place] [address]    Culture PENDING    Report Status PENDING   Blood Culture (routine x 2)     Status: None (Preliminary result)   Collection Time: 04/30/23 10:01 AM   Specimen: BLOOD  Result Value Ref Range   Specimen Description BLOOD BLOOD RIGHT HAND    Special Requests      BOTTLES DRAWN AEROBIC AND ANAEROBIC Blood Culture results may not be optimal due to an excessive volume of blood received in culture bottles Performed at [facility], [address], [place], [place] [address]    Culture PENDING    Report Status PENDING   Lactic acid, plasma     Status: None   Collection Time: 04/30/23 10:50 AM  Result Value Ref Range   Lactic Acid, Venous 0.9 0.5 - 1.9 mmol/L  Urinalysis, w/ Reflex to Culture (Infection Suspected) -Urine, Catheterized     Status: Abnormal   Collection Time: 04/30/23 11:03 AM  Result Value Ref Range   Specimen Source URINE, CATHETERIZED    Color, Urine STRAW (A) YELLOW   APPearance CLEAR CLEAR   Specific Gravity, Urine 1.006 1.005 - 1.030   pH 7.0 5.0 - 8.0   Glucose, UA NEGATIVE NEGATIVE mg/dL   Hgb urine dipstick NEGATIVE NEGATIVE   Bilirubin Urine NEGATIVE NEGATIVE   Ketones, ur NEGATIVE NEGATIVE mg/dL  Protein, ur 30 (A) NEGATIVE mg/dL   Nitrite NEGATIVE NEGATIVE   Leukocytes,Ua NEGATIVE NEGATIVE   RBC / HPF 0-5 0 - 5 RBC/hpf   WBC, UA 0-5 0 - 5 WBC/hpf   Bacteria, UA NONE SEEN NONE SEEN   Squamous Epithelial / HPF 0-5 0 - 5 /HPF  SARS Coronavirus 2 by RT PCR (hospital order, performed in [facility] hospital lab) *cepheid single result test* Anterior Nasal Swab     Status: None   Collection Time: 04/30/23  3:35 PM   Specimen: Anterior Nasal Swab  Result Value Ref Range   SARS Coronavirus 2 by RT PCR NEGATIVE NEGATIVE    DG Chest Port  1 View  Result Date: 04/30/2023 CLINICAL DATA:  Productive cough.  Shortness of breath EXAM: PORTABLE CHEST 1 VIEW COMPARISON:  X-ray 04/26/2023 and noncontrast CT FINDINGS: Hyperinflation. No consolidation, pneumothorax, effusion or edema. Enlarged cardiopericardial silhouette. Calcified aorta. Kyphotic x-ray obscures the right lung apex. Film is also slightly rotated. Osteopenia with scattered degenerative changes of the spine and shoulders. Overlapping cardiac leads. IMPRESSION: Hyperinflation with chronic changes. Electronically Signed   By: Karen Kays M.D.   On: 04/30/2023 10:17     Assessment and Plan: No notes have been filed under this hospital service. Service: Hospitalist  Principal Problem:   AKI (acute kidney injury) (HCC) Active Problems:   Stroke (HCC)   HTN (hypertension)   GERD (gastroesophageal reflux disease)   Dehydration   Bronchitis, acute   Chronic diastolic CHF (congestive heart failure) (HCC)  Dehydration, chronic kidney disease Will cautiously give fluids overnight.  Hold Bumex Check creatinine in the morning.  Does appear that the patient is at baseline Generalized weakness Likely secondary to dehydration and bronchitis Bronchitis Given hyperinflation on chest x-ray, the patient likely has COPD Uncertain whether this is related to the patient's weakness Will give steroids, albuterol.  At this moment, it the patient does not need oral or IV antibiotics. History of stroke Stable Chronic diastolic heart failure Holding Bumex Hypertension Continue antihypertensives GERD   Advance Care Planning:   Code Status: DNR confirmed by patient  Consults: None  Family Communication: None  Severity of Illness: The appropriate patient status for this patient is OBSERVATION. Observation status is judged to be reasonable and necessary in order to provide the required intensity of service to ensure the patient's safety. The patient's presenting symptoms, physical  exam findings, and initial radiographic and laboratory data in the context of their medical condition is felt to place them at decreased risk for further clinical deterioration. Furthermore, it is anticipated that the patient will be medically stable for discharge from the hospital within 2 midnights of admission.   Author: Levie Heritage, DO 04/30/2023 4:56 PM  For on call review [email or web address].

## 2023-04-30 NOTE — ED Notes (Signed)
When attempted to ambulate pt the pt was weak and unsteady, pt was not able to stand on her own without almost falling. Pt was assisted back in bed by this NT and RN.

## 2023-04-30 NOTE — ED Triage Notes (Signed)
Pt arrived via [facility] c/o generalized weakness, productive cough with yellow-green mucus, and SOB with exertion. Pt was here Tuesday for similar Sx. Pt was recently diagnosed with a UTI and finished antibiotics on Tuesday but now states she is urinating more frequently, denies dysuria

## 2023-04-30 NOTE — ED Provider Notes (Addendum)
[place] EMERGENCY DEPARTMENT AT [facility] Provider Note   CSN: [id number] Arrival date & time: 04/30/23  0825     History  Chief Complaint  Patient presents with   Cough    Carrie Sanders is a 89 y.o. female.  HPI 89 year old female recently hospitalized for generalized weakness, with mild AKI, and dehydration in the setting of recent diarrhea who presents today with ongoing weakness and cough productive of yellow-green sputum.     Home Medications Prior to Admission medications   Medication Sig Start Date End Date Taking? Authorizing Provider  acetaminophen (TYLENOL) 325 MG tablet Take 650 mg by mouth every 6 (six) hours as needed for fever.    [provider]  aspirin 81 MG chewable tablet Chew 1 tablet (81 mg total) by mouth daily. 07/23/22   Leatha Gilding, MD  bumetanide (BUMEX) 1 MG tablet Take 1 tablet (1 mg total) by mouth daily as needed (Edema, swelling and/or SOB). 04/27/23   Vassie Loll, MD  Cholecalciferol (VITAMIN D3) 50 MCG (2000 UT) TABS Take 1 tablet by mouth daily.    [provider]  losartan (COZAAR) 100 MG tablet Take 50 mg by mouth every morning.    [provider]  Menthol, Topical Analgesic, (COOLING PAIN RELIEF) 4 % GEL Apply topically 3 (three) times daily. Apply topically to lower back, knees, or hands.    [provider]  pantoprazole (PROTONIX) 40 MG tablet Take 40 mg by mouth daily.    [provider]  polyethylene glycol (MIRALAX / GLYCOLAX) 17 g packet Take 17 g by mouth daily as needed for moderate constipation. 04/27/23   Vassie Loll, MD  potassium chloride (KLOR-CON) 10 MEQ tablet Take 10 mEq by mouth daily. Take 1 tablet by mouth once daily with Bumex.    [provider]  rosuvastatin (CRESTOR) 20 MG tablet Take 20 mg by mouth at bedtime.    [provider]  Sennosides-Docusate Sodium 8.6-50 MG CAPS Take 1 capsule by mouth at bedtime as needed (constpation). Take  1 tablet by mouth at bedtime as needed.    [provider]  traMADol (ULTRAM) 50 MG tablet Take 1 tablet (50 mg total) by mouth daily as needed for severe pain. 04/27/23 04/26/24  Vassie Loll, MD      Allergies    Codeine and Sulfonamide derivatives    Review of Systems   Review of Systems  Physical Exam Updated Vital Signs BP (!) 183/98   Pulse 98   Temp 98.3 F (36.8 C) (Oral)   Resp 20   Ht 1.549 m (5\' 1" )   Wt 50.8 kg   SpO2 92%   BMI 21.16 kg/m  Physical Exam Vitals and nursing note reviewed.  HENT:     Head: Normocephalic.     Right Ear: External ear normal.     Left Ear: External ear normal.     Nose: Nose normal.     Mouth/Throat:     Pharynx: Oropharynx is clear.  Eyes:     Extraocular Movements: Extraocular movements intact.     Pupils: Pupils are equal, round, and reactive to light.  Cardiovascular:     Rate and Rhythm: Normal rate and regular rhythm.     Pulses: Normal pulses.  Pulmonary:     Effort: Pulmonary effort is normal.     Breath sounds: Normal breath sounds.  Abdominal:     General: Abdomen is flat. Bowel sounds are normal.  Musculoskeletal:  General: Normal range of motion.     Cervical back: Normal range of motion.  Skin:    General: Skin is warm.     Capillary Refill: Capillary refill takes less than 2 seconds.  Neurological:     General: No focal deficit present.     Mental Status: She is alert.  Psychiatric:        Mood and Affect: Mood normal.     ED Results / Procedures / Treatments   Labs (all labs ordered are listed, but only abnormal results are displayed) Labs Reviewed  COMPREHENSIVE METABOLIC PANEL - Abnormal; Notable for the following components:      Result Value   BUN 24 (*)    Creatinine, Ser 1.42 (*)    Total Bilirubin 1.4 (*)    GFR, Estimated 34 (*)    All other components within normal limits  CBC WITH DIFFERENTIAL/PLATELET - Abnormal; Notable for the following components:   RBC 3.56 (*)     Hemoglobin 11.4 (*)    HCT 35.3 (*)    Platelets 129 (*)    All other components within normal limits  URINALYSIS, W/ REFLEX TO CULTURE (INFECTION SUSPECTED) - Abnormal; Notable for the following components:   Color, Urine STRAW (*)    Protein, ur 30 (*)    All other components within normal limits  CULTURE, BLOOD (ROUTINE X 2)  CULTURE, BLOOD (ROUTINE X 2)  LACTIC ACID, PLASMA  LACTIC ACID, PLASMA  PROTIME-INR  APTT    EKG EKG Interpretation  Date/Time:  Friday Apr 30 2023 08:36:58 EDT Ventricular Rate:  105 PR Interval:  172 QRS Duration: 112 QT Interval:  390 QTC Calculation: 516 R Axis:   -86 Text Interpretation: Sinus tachycardia nonspecific intraventricular delay Confirmed by Margarita Grizzle [phone]) on 04/30/2023 11:51:57 AM  Radiology DG Chest Port 1 View  Result Date: 04/30/2023 CLINICAL DATA:  Productive cough.  Shortness of breath EXAM: PORTABLE CHEST 1 VIEW COMPARISON:  X-Rafiel Mecca 04/26/2023 and noncontrast CT FINDINGS: Hyperinflation. No consolidation, pneumothorax, effusion or edema. Enlarged cardiopericardial silhouette. Calcified aorta. Kyphotic x-Jaxie Racanelli obscures the right lung apex. Film is also slightly rotated. Osteopenia with scattered degenerative changes of the spine and shoulders. Overlapping cardiac leads. IMPRESSION: Hyperinflation with chronic changes. Electronically Signed   By: Karen Kays M.D.   On: 04/30/2023 10:17    Procedures Procedures    Medications Ordered in ED Medications  lactated ringers bolus 500 mL (0 mLs Intravenous Stopped 04/30/23 1123)  lactated ringers bolus 500 mL (0 mLs Intravenous Stopped 04/30/23 1440)    ED Course/ Medical Decision Making/ A&P                             Medical Decision Making Amount and/or Complexity of Data Reviewed Labs: ordered. Radiology: ordered.  89 year old female recent admission for generalized weakness presents today with ongoing cough and weakness.  She is hemodynamically stable here in the ED.   She was evaluated here with imaging of her chest which is shows hyperinflation and chronic changes no acute abnormality CBC shows normal white blood cell count with mild anemia stable from before Complete metabolic panel significant for creatinine 1.42 which is again stable from before. Urinalysis is without any evidence of acute UTI Lactic acid is normal She continues tachycardiac with generalized weakness She is unable to walk and had a near fall with assist  DDX Volume depletion, acute infection, pe, aki,  Patient with recent hospitalization for  same with concern for aki with recent diarrhea thought to be due to uti and antibiotics,  VQ scan during hospitalization negative for PE, ua no evidence of acute infection.  Doubt stroke or acute neuro etiology without focal deficit.  Patient received iv fluids but continues weak. Patient in ALF and unable to ambulate Plan admission for hydration, mobilization   Care discussed with Dr. Adrian Blackwater who will see for admission  Final Clinical Impression(s) / ED Diagnoses Final diagnoses:  Weakness    Rx / DC Orders ED Discharge Orders     None         Margarita Grizzle, MD 04/30/23 1500    Margarita Grizzle, MD 04/30/23 1544

## 2023-04-30 NOTE — ED Notes (Signed)
This RN and a NT attempted to ambulate pt, was able to assist pt to getting to side of the bed; However, upon standing pt gait was unsteady and legs gave out. Pt did not fall or hit the ground, This RN and a NT assisted pt back to bed--MD made aware

## 2023-04-30 NOTE — Discharge Instructions (Signed)
Please continue to drink plenty of fluids. Walk only with assistance Please recheck with your doctor this week.

## 2023-04-30 NOTE — ED Notes (Signed)
ED TO INPATIENT HANDOFF REPORT  ED Nurse Name and Phone #: Claris Gower Adlai Nieblas [phone]   S Name/Age/Gender Carrie Sanders 89 y.o. female Room/Bed: APA18/APA18  Code Status   Code Status: Prior  Home/SNF/Other Assisted living Patient oriented to: self, place, time, and situation Is this baseline? Yes   Triage Complete: Triage complete  Chief Complaint AKI (acute kidney injury) (HCC) [N17.9]  Triage Note Pt arrived via [facility] c/o generalized weakness, productive cough with yellow-green mucus, and SOB with exertion. Pt was here Tuesday for similar Sx. Pt was recently diagnosed with a UTI and finished antibiotics on Tuesday but now states she is urinating more frequently, denies dysuria    Allergies Allergies  Allergen Reactions   Codeine Nausea Only and Other (See Comments)    Pass out    Sulfonamide Derivatives Rash    Level of Care/Admitting Diagnosis ED Disposition     ED Disposition  Admit   Condition  --   Comment  Hospital Area: [facility] [[id number]]  Level of Care: Med-Surg [16]  Covid Evaluation: Asymptomatic - no recent exposure (last 10 days) testing not required  Diagnosis: AKI (acute kidney injury) [facility]) [[id number]]  Admitting Physician: Levie Heritage [[id number]]  Attending Physician: Levie Heritage [[id number]]          B Medical/Surgery History Past Medical History:  Diagnosis Date   Arthritis    Constipation    Gait disorder 05/26/2016   H/O left breast biopsy    High cholesterol    Hypertension    Paresthesia 05/26/2016   Pessary maintenance    Sinus drainage    Sleeping difficulty    Past Surgical History:  Procedure Laterality Date   APPENDECTOMY     AGE 89   CATARACTS REMOVED     JOINT REPLACEMENT  1998   RT TOTAL HIP   TOTAL HIP ARTHROPLASTY  03/17/2012   Procedure: TOTAL HIP ARTHROPLASTY ANTERIOR APPROACH;  Surgeon: Shelda Pal, MD;  Location: [facility];  Service: Orthopedics;  Laterality: Left;     A IV  Location/Drains/Wounds Patient Lines/Drains/Airways Status     Active Line/Drains/Airways     Name Placement date Placement time Site Days   Peripheral IV 04/30/23 20 G Anterior;Left;Upper Arm 04/30/23  0900  Arm  less than 1   Peripheral IV 04/30/23 22 G 1" Anterior;Right;Upper Arm 04/30/23  0912  Arm  less than 1            Intake/Output Last 24 hours No intake or output data in the 24 hours ending 04/30/23 1630  Labs/Imaging Results for orders placed or performed during the hospital encounter of 04/30/23 (from the past 48 hour(s))  Lactic acid, plasma     Status: None   Collection Time: 04/30/23  8:50 AM  Result Value Ref Range   Lactic Acid, Venous 0.9 0.5 - 1.9 mmol/L    Comment: Performed at [facility], [address], [place], [place] [address]  Comprehensive metabolic panel     Status: Abnormal   Collection Time: 04/30/23  8:50 AM  Result Value Ref Range   Sodium 137 135 - 145 mmol/L   Potassium 4.3 3.5 - 5.1 mmol/L   Chloride 106 98 - 111 mmol/L   CO2 22 22 - 32 mmol/L   Glucose, Bld 98 70 - 99 mg/dL    Comment: Glucose reference range applies only to samples taken after fasting for at least 8 hours.   BUN 24 (H) 8 - 23 mg/dL  Creatinine, Ser 1.42 (H) 0.44 - 1.00 mg/dL   Calcium 9.3 8.9 - 40.9 mg/dL   Total Protein 6.7 6.5 - 8.1 g/dL   Albumin 3.6 3.5 - 5.0 g/dL   AST 33 15 - 41 U/L   ALT 25 0 - 44 U/L   Alkaline Phosphatase 62 38 - 126 U/L   Total Bilirubin 1.4 (H) 0.3 - 1.2 mg/dL   GFR, Estimated 34 (L) >60 mL/min    Comment: (NOTE) Calculated using the CKD-EPI Creatinine Equation (2021)    Anion gap 9 5 - 15    Comment: Performed at [facility], [address] [place], [place] [address]  CBC with Differential     Status: Abnormal   Collection Time: 04/30/23  8:50 AM  Result Value Ref Range   WBC 4.1 4.0 - 10.5 K/uL   RBC 3.56 (L) 3.87 - 5.11 MIL/uL   Hemoglobin 11.4 (L) 12.0 - 15.0 g/dL   HCT 47.8 (L) 29.5 - 62.1 %   MCV 99.2 80.0 - 100.0 fL    MCH 32.0 26.0 - 34.0 pg   MCHC 32.3 30.0 - 36.0 g/dL   RDW 30.8 65.7 - 84.6 %   Platelets 129 (L) 150 - 400 K/uL   nRBC 0.0 0.0 - 0.2 %   Neutrophils Relative % 72 %   Neutro Abs 3.0 1.7 - 7.7 K/uL   Lymphocytes Relative 17 %   Lymphs Abs 0.7 0.7 - 4.0 K/uL   Monocytes Relative 8 %   Monocytes Absolute 0.3 0.1 - 1.0 K/uL   Eosinophils Relative 1 %   Eosinophils Absolute 0.0 0.0 - 0.5 K/uL   Basophils Relative 1 %   Basophils Absolute 0.0 0.0 - 0.1 K/uL   Immature Granulocytes 1 %   Abs Immature Granulocytes 0.02 0.00 - 0.07 K/uL    Comment: Performed at [facility], [address] [place], [place] [address]  Protime-INR     Status: None   Collection Time: 04/30/23  8:50 AM  Result Value Ref Range   Prothrombin Time 13.6 11.4 - 15.2 seconds   INR 1.0 0.8 - 1.2    Comment: (NOTE) INR goal varies based on device and disease states. Performed at [facility], [address], [place], [place] [address]   APTT     Status: None   Collection Time: 04/30/23  8:50 AM  Result Value Ref Range   aPTT 24 24 - 36 seconds    Comment: Performed at [facility], [address] [place], [place] [address]  Blood Culture (routine x 2)     Status: None (Preliminary result)   Collection Time: 04/30/23  9:00 AM   Specimen: Blood  Result Value Ref Range   Specimen Description      RIGHT ANTECUBITAL BOTTLES DRAWN AEROBIC AND ANAEROBIC   Special Requests      Blood Culture results may not be optimal due to an inadequate volume of blood received in culture bottles Performed at [facility], [address], [place], [place] [address]    Culture PENDING    Report Status PENDING   Blood Culture (routine x 2)     Status: None (Preliminary result)   Collection Time: 04/30/23 10:01 AM   Specimen: BLOOD  Result Value Ref Range   Specimen Description BLOOD BLOOD RIGHT HAND    Special Requests      BOTTLES DRAWN AEROBIC AND ANAEROBIC Blood Culture results may not be optimal due to an excessive volume of  blood received  in culture bottles Performed at [facility], [address] [place], [place] [address]    Culture PENDING    Report Status PENDING   Lactic acid, plasma     Status: None   Collection Time: 04/30/23 10:50 AM  Result Value Ref Range   Lactic Acid, Venous 0.9 0.5 - 1.9 mmol/L    Comment: Performed at [facility], [address], [place], [place] [address]  Urinalysis, w/ Reflex to Culture (Infection Suspected) -Urine, Catheterized     Status: Abnormal   Collection Time: 04/30/23 11:03 AM  Result Value Ref Range   Specimen Source URINE, CATHETERIZED    Color, Urine STRAW (A) YELLOW   APPearance CLEAR CLEAR   Specific Gravity, Urine 1.006 1.005 - 1.030   pH 7.0 5.0 - 8.0   Glucose, UA NEGATIVE NEGATIVE mg/dL   Hgb urine dipstick NEGATIVE NEGATIVE   Bilirubin Urine NEGATIVE NEGATIVE   Ketones, ur NEGATIVE NEGATIVE mg/dL   Protein, ur 30 (A) NEGATIVE mg/dL   Nitrite NEGATIVE NEGATIVE   Leukocytes,Ua NEGATIVE NEGATIVE   RBC / HPF 0-5 0 - 5 RBC/hpf   WBC, UA 0-5 0 - 5 WBC/hpf    Comment:        Reflex urine culture not performed if WBC <=10, OR if Squamous epithelial cells >5. If Squamous epithelial cells >5 suggest recollection.    Bacteria, UA NONE SEEN NONE SEEN   Squamous Epithelial / HPF 0-5 0 - 5 /HPF    Comment: Performed at [facility], [address], [place], [place] [address]  SARS Coronavirus 2 by RT PCR (hospital order, performed in [facility] hospital lab) *cepheid single result test* Anterior Nasal Swab     Status: None   Collection Time: 04/30/23  3:35 PM   Specimen: Anterior Nasal Swab  Result Value Ref Range   SARS Coronavirus 2 by RT PCR NEGATIVE NEGATIVE    Comment: (NOTE) SARS-CoV-2 target nucleic acids are NOT DETECTED.  The SARS-CoV-2 RNA is generally detectable in upper and lower respiratory specimens during the acute phase of infection. The lowest concentration of SARS-CoV-2 viral copies this assay can detect is 250 copies / mL. A  negative result does not preclude SARS-CoV-2 infection and should not be used as the sole basis for treatment or other patient management decisions.  A negative result may occur with improper specimen collection / handling, submission of specimen other than nasopharyngeal swab, presence of viral mutation(s) within the areas targeted by this assay, and inadequate number of viral copies (<250 copies / mL). A negative result must be combined with clinical observations, patient history, and epidemiological information.  Fact Sheet for Patients:   RoadLapTop.co.za  Fact Sheet for Healthcare Providers: http://kim-miller.com/  This test is not yet approved or  cleared by the [place] FDA and has been authorized for detection and/or diagnosis of SARS-CoV-2 by FDA under an Emergency Use Authorization (EUA).  This EUA will remain in effect (meaning this test can be used) for the duration of the COVID-19 declaration under Section 564(b)(1) of the Act, 21 U.S.C. section 360bbb-3(b)(1), unless the authorization is terminated or revoked sooner.  Performed at [facility], [address] [place], [place] [address]    DG Chest Port 1 View  Result Date: 04/30/2023 CLINICAL DATA:  Productive cough.  Shortness of breath EXAM: PORTABLE CHEST 1 VIEW COMPARISON:  X-ray 04/26/2023 and noncontrast CT FINDINGS: Hyperinflation. No consolidation, pneumothorax, effusion or edema. Enlarged cardiopericardial silhouette. Calcified aorta. Kyphotic x-ray obscures the right lung  apex. Film is also slightly rotated. Osteopenia with scattered degenerative changes of the spine and shoulders. Overlapping cardiac leads. IMPRESSION: Hyperinflation with chronic changes. Electronically Signed   By: Karen Kays M.D.   On: 04/30/2023 10:17    Pending Labs Unresulted Labs (From admission, onward)    None       Vitals/Pain Today's Vitals   04/30/23 1330 04/30/23 1430  04/30/23 1500 04/30/23 1530  BP: (!) 141/102 (!) 183/98 (!) 159/98 (!) 161/92  Pulse: (!) 107 98 92 99  Resp: 15 20 16 17   Temp:      TempSrc:      SpO2: 93% 92% 91% 94%  Weight:      Height:      PainSc:        Isolation Precautions Airborne and Contact precautions  Medications Medications  lactated ringers infusion (has no administration in time range)  lactated ringers bolus 500 mL (0 mLs Intravenous Stopped 04/30/23 1123)  lactated ringers bolus 500 mL (0 mLs Intravenous Stopped 04/30/23 1440)    Mobility non-ambulatory     Focused Assessments    R Recommendations: See Admitting Provider Note  Report given to: Mallory, RN  Additional Notes:

## 2023-05-01 DIAGNOSIS — N179 Acute kidney failure, unspecified: Secondary | ICD-10-CM | POA: Diagnosis not present

## 2023-05-01 LAB — BASIC METABOLIC PANEL
Anion gap: 9 (ref 5–15)
BUN: 21 mg/dL (ref 8–23)
CO2: 23 mmol/L (ref 22–32)
Calcium: 9.2 mg/dL (ref 8.9–10.3)
Chloride: 109 mmol/L (ref 98–111)
Creatinine, Ser: 1.26 mg/dL — ABNORMAL HIGH (ref 0.44–1.00)
GFR, Estimated: 40 mL/min — ABNORMAL LOW (ref >60)
Glucose, Bld: 125 mg/dL — ABNORMAL HIGH (ref 70–99)
Potassium: 4.4 mmol/L (ref 3.5–5.1)
Sodium: 141 mmol/L (ref 135–145)

## 2023-05-01 LAB — CULTURE, BLOOD (ROUTINE X 2)

## 2023-05-01 MED ORDER — METOPROLOL TARTRATE 25 MG PO TABS: 12.5000 mg | ORAL_TABLET | Freq: Two times a day (BID) | ORAL | 11 refills | Status: DC

## 2023-05-01 MED ORDER — ENSURE ENLIVE PO LIQD: 237.0000 mL | Freq: Two times a day (BID) | ORAL | 12 refills | Status: DC

## 2023-05-01 MED ORDER — ALBUTEROL SULFATE (2.5 MG/3ML) 0.083% IN NEBU
2.5000 mg | INHALATION_SOLUTION | RESPIRATORY_TRACT | Status: DC
  Administered 2023-05-01: 2.5 mg via RESPIRATORY_TRACT
  Filled 2023-05-01: qty 3

## 2023-05-01 MED ORDER — COMBIVENT RESPIMAT 20-100 MCG/ACT IN AERS: 1.0000 | INHALATION_SPRAY | Freq: Four times a day (QID) | RESPIRATORY_TRACT | 0 refills | Status: AC | PRN

## 2023-05-01 MED ORDER — PREDNISONE 20 MG PO TABS: 40.0000 mg | ORAL_TABLET | Freq: Every day | ORAL | 0 refills | Status: DC

## 2023-05-01 MED ORDER — FUROSEMIDE 20 MG PO TABS: 20.0000 mg | ORAL_TABLET | Freq: Every day | ORAL | 11 refills | Status: DC

## 2023-05-01 MED ORDER — ALBUTEROL SULFATE (2.5 MG/3ML) 0.083% IN NEBU: 2.5000 mg | INHALATION_SOLUTION | Freq: Four times a day (QID) | RESPIRATORY_TRACT | Status: DC

## 2023-05-01 MED ORDER — POTASSIUM CHLORIDE ER 10 MEQ PO TBCR: 10.0000 meq | EXTENDED_RELEASE_TABLET | Freq: Every day | ORAL | 0 refills | Status: AC

## 2023-05-01 MED ORDER — PREDNISONE 20 MG PO TABS: 40.0000 mg | ORAL_TABLET | Freq: Every day | ORAL | 0 refills | Status: AC

## 2023-05-01 MED ORDER — ENSURE ENLIVE PO LIQD: 237.0000 mL | Freq: Two times a day (BID) | ORAL | 12 refills | Status: AC

## 2023-05-01 MED ORDER — POTASSIUM CHLORIDE ER 10 MEQ PO TBCR: 10.0000 meq | EXTENDED_RELEASE_TABLET | Freq: Every day | ORAL | 0 refills | Status: DC

## 2023-05-01 MED ORDER — TRAMADOL HCL 50 MG PO TABS: 50.0000 mg | ORAL_TABLET | Freq: Every day | ORAL | 0 refills | Status: AC | PRN

## 2023-05-01 MED ORDER — METOPROLOL TARTRATE 25 MG PO TABS: 12.5000 mg | ORAL_TABLET | Freq: Two times a day (BID) | ORAL | 11 refills | Status: AC

## 2023-05-01 MED ORDER — COMBIVENT RESPIMAT 20-100 MCG/ACT IN AERS: 1.0000 | INHALATION_SPRAY | Freq: Four times a day (QID) | RESPIRATORY_TRACT | 0 refills | Status: DC | PRN

## 2023-05-01 MED ORDER — FUROSEMIDE 20 MG PO TABS: 20.0000 mg | ORAL_TABLET | Freq: Every day | ORAL | 11 refills | Status: AC

## 2023-05-01 NOTE — TOC Transition Note (Addendum)
Transition of Care [facility]) - CM/SW Discharge Note   Patient Details  Name: Carrie Sanders MRN: [id number] Date of Birth: [birth year]  [facility] CM/SW Contact:  Princella Ion, LCSW Phone Number: 05/01/2023, 11:54 AM   Clinical Narrative:    Pt to d/c back to [facility]. Report called by LPN. Med Necessity completed. LPN to call EMS. No further TOC needs.          Patient Goals and CMS Choice      Discharge Placement                         Discharge Plan and Services Additional resources added to the After Visit Summary for                                       Social Determinants of Health (SDOH) Interventions SDOH Screenings   Food Insecurity: No Food Insecurity (04/30/2023)  Housing: Low Risk  (04/30/2023)  Transportation Needs: No Transportation Needs (04/30/2023)  Utilities: Not At Risk (04/30/2023)  Tobacco Use: Medium Risk (04/30/2023)     Readmission Risk Interventions     No data to display

## 2023-05-01 NOTE — Discharge Summary (Signed)
Physician Discharge Summary  Carrie Sanders Carrie Sanders ZOX:[id number] DOB: September 12, 1927 DOA: 04/30/2023  PCP: Sunday Spillers, NP  Admit date: 04/30/2023  Discharge date: 05/01/2023  Admitted From:SNF  Disposition:  SNF  Recommendations for Outpatient Follow-up:  Follow up with PCP in 1-2 weeks Please obtain BMP in one week Remain on Lasix 20 mg daily as prescribed with recommendations for additional 20 mg daily as needed for worsening edema or shortness of breath or weight gain as prescribed and weigh daily Discontinue losartan and switch to metoprolol 12.5 mg twice daily Continue prednisone for 3 more days as prescribed Use Combivent as needed for shortness of breath or wheezing Continue other home medications as prior  Home Health: None  Equipment/Devices: None  Discharge Condition:Stable  CODE STATUS: Full  Diet recommendation: Heart Healthy  Brief/Interim Summary:  Carrie Sanders is a 89 y.o. female with medical history significant of HTN, hyperlipidemia, history of stroke.  Patient was recently observed in the hospital overnight due to weakness and difficulty breathing. She was found to have an AKI secondary to prerenal azotemia.  She was given IV fluids and discharged the following day.  Today, she continues to be weak and now presents with a cough and shortness of breath.  She was diagnosed with some bronchitis and had no evidence of pneumonia on chest x-ray and was started on prednisone as well as some breathing treatments with improvement noted.  She was given IV fluid for her AKI and her creatinine has now returned to baseline.  She is stable for discharge and continues to have ongoing weakness for which she will require rehabilitation at her facility.  It appears that she may have been given too much Bumex and therefore her medications will be adjusted to Lasix at a lower dose to assist with prevention of recurrent AKI/dehydration.  Her losartan will be changed over to metoprolol as  well to help ensure ongoing blood pressure control.  No other acute events noted during this brief admission and she is stable for discharge.  Discharge Diagnoses:  Principal Problem:   AKI (acute kidney injury) (HCC) Active Problems:   Stroke (HCC)   HTN (hypertension)   GERD (gastroesophageal reflux disease)   Dehydration   Bronchitis, acute   Chronic diastolic CHF (congestive heart failure) (HCC)  Principal discharge diagnosis: AKI-prerenal along with mild bronchitis.  Discharge Instructions  Discharge Instructions     Diet - low sodium heart healthy   Complete by: As directed    Increase activity slowly   Complete by: As directed       Allergies as of 05/01/2023       Reactions   Codeine Nausea Only, Other (See Comments)   Pass out   Sulfonamide Derivatives Rash        Medication List     STOP taking these medications    bumetanide 1 MG tablet Commonly known as: BUMEX   losartan 50 MG tablet Commonly known as: COZAAR       TAKE these medications    acetaminophen 325 MG tablet Commonly known as: TYLENOL Take 650 mg by mouth every 6 (six) hours as needed for fever, mild pain or moderate pain.   acetaminophen 650 MG suppository Commonly known as: TYLENOL Place 650 mg rectally every 4 (four) hours as needed for fever (chills).   aspirin 81 MG chewable tablet Chew 1 tablet (81 mg total) by mouth daily.   Combivent Respimat 20-100 MCG/ACT Aers respimat Generic drug: Ipratropium-Albuterol Inhale 1 puff  into the lungs every 6 (six) hours as needed for wheezing or shortness of breath.   Cooling Pain Relief 4 % Gel Generic drug: Menthol (Topical Analgesic) Apply 1 application  topically 3 (three) times daily as needed (pain). Apply topically to lower back, knees, or hands.   feeding supplement Liqd Take 237 mLs by mouth 2 (two) times daily between meals.   furosemide 20 MG tablet Commonly known as: Lasix Take 1 tablet (20 mg total) by mouth daily.  Give extra dose of lasix for total 40mg  if weight gain of 3 pounds in 24 hours or 5 pounds in 7 days. Also may give additional dose, for total 40mg  if worsening edema or shortness of breath.   metoprolol tartrate 25 MG tablet Commonly known as: LOPRESSOR Take 0.5 tablets (12.5 mg total) by mouth 2 (two) times daily.   pantoprazole 40 MG tablet Commonly known as: PROTONIX Take 40 mg by mouth daily.   polyethylene glycol 17 g packet Commonly known as: MIRALAX / GLYCOLAX Take 17 g by mouth daily as needed for moderate constipation.   potassium chloride 10 MEQ tablet Commonly known as: KLOR-CON Take 1 tablet (10 mEq total) by mouth daily. Take 1 tablet by mouth once daily with Lasix. What changed: additional instructions   predniSONE 20 MG tablet Commonly known as: DELTASONE Take 2 tablets (40 mg total) by mouth daily with breakfast for 3 days. Start taking on: May 02, 2023   rosuvastatin 20 MG tablet Commonly known as: CRESTOR Take 20 mg by mouth at bedtime.   Sennosides-Docusate Sodium 8.6-50 MG Caps Take 1 capsule by mouth at bedtime as needed (constpation).   traMADol 50 MG tablet Commonly known as: Ultram Take 1 tablet (50 mg total) by mouth daily as needed for severe pain.   Vitamin D3 50 MCG (2000 UT) Tabs Take 2,000 Units by mouth daily.        Follow-up Information     Cyndie Chime, [place] Ansel Bong, NP. Schedule an appointment as soon as possible for a visit in 1 week(s).   Specialty: Nurse Practitioner Contact information: [address] [place] [address] [phone]                Allergies  Allergen Reactions   Codeine Nausea Only and Other (See Comments)    Pass out    Sulfonamide Derivatives Rash    Consultations: None   Procedures/Studies: DG Chest Port 1 View  Result Date: 04/30/2023 CLINICAL DATA:  Productive cough.  Shortness of breath EXAM: PORTABLE CHEST 1 VIEW COMPARISON:  X-ray 04/26/2023 and noncontrast CT FINDINGS:  Hyperinflation. No consolidation, pneumothorax, effusion or edema. Enlarged cardiopericardial silhouette. Calcified aorta. Kyphotic x-ray obscures the right lung apex. Film is also slightly rotated. Osteopenia with scattered degenerative changes of the spine and shoulders. Overlapping cardiac leads. IMPRESSION: Hyperinflation with chronic changes. Electronically Signed   By: Karen Kays M.D.   On: 04/30/2023 10:17   NM Pulmonary Perfusion  Result Date: 04/26/2023 CLINICAL DATA:  Pulmonary embolism (PE) suspected, low to intermediate prob, positive D-dimer EXAM: NUCLEAR MEDICINE PERFUSION LUNG SCAN TECHNIQUE: Perfusion images were obtained in multiple projections after intravenous injection of radiopharmaceutical. Ventilation scans intentionally deferred if perfusion scan and chest x-ray adequate for interpretation during COVID 19 epidemic. RADIOPHARMACEUTICALS:  4.3 mCi Tc-46m MAA IV COMPARISON:  Radiograph and CT earlier today FINDINGS: Mildly heterogeneous pulmonary perfusion but no discrete wedge-shaped perfusion defects. IMPRESSION: No scintigraphic evidence of pulmonary embolus. Electronically Signed   By: Shawna Orleans  Sanford M.D.   On: 04/26/2023 18:27   CT Chest Wo Contrast  Result Date: 04/26/2023 CLINICAL DATA:  Respiratory illness. EXAM: CT CHEST WITHOUT CONTRAST TECHNIQUE: Multidetector CT imaging of the chest was performed following the standard protocol without IV contrast. RADIATION DOSE REDUCTION: This exam was performed according to the departmental dose-optimization program which includes automated exposure control, adjustment of the mA and/or kV according to patient size and/or use of iterative reconstruction technique. COMPARISON:  Chest radiograph from the same date. FINDINGS: Cardiovascular: Mildly enlarged heart. Minimal pericardial effusion. Heavy calcific atherosclerotic disease of the coronary arteries and aorta. Annular calcifications of the mitral and aortic valves.  Mediastinum/Nodes: No enlarged mediastinal or axillary lymph nodes. Thyroid gland, trachea, and esophagus demonstrate no significant findings. Lungs/Pleura: Mild chronic interstitial changes and bronchiectasis. No focal lobar airspace consolidation. Minimal atelectasis in the lower lobes. Upper Abdomen: Too small to be accurately characterized hypoattenuated nodules throughout the liver parenchyma. Musculoskeletal: Spondylosis of the thoracic spine. IMPRESSION: 1. Mild chronic interstitial changes and bronchiectasis. 2. No focal lobar airspace consolidation. 3. Mildly enlarged heart. Minimal pericardial effusion. 4. Heavy calcific atherosclerotic disease of the coronary arteries and aorta. 5. Too small to be accurately characterized hypoattenuated nodules throughout the liver parenchyma. 6. Aortic atherosclerosis. Aortic Atherosclerosis (ICD10-I70.0). Electronically Signed   By: Ted Mcalpine M.D.   On: 04/26/2023 17:13   DG Chest Portable 1 View  Result Date: 04/26/2023 CLINICAL DATA:  Shortness of breath. EXAM: PORTABLE CHEST 1 VIEW COMPARISON:  None Available. FINDINGS: The heart size and mediastinal contours are within normal limits. Aortic atherosclerotic calcification incidentally noted. Pulmonary hyperinflation again noted. Both lungs are clear. IMPRESSION: No active cardiopulmonary disease.  Probable COPD. Electronically Signed   By: Danae Orleans M.D.   On: 04/26/2023 11:49   DG Hip Unilat W or Wo Pelvis 2-3 Views Left  Result Date: 04/01/2023 CLINICAL DATA:  Pain after fall EXAM: DG HIP (WITH OR WITHOUT PELVIS) 3V LEFT COMPARISON:  None Available. FINDINGS: Osteopenia. Bilateral hip arthroplasties are identified with screw fixated acetabular cups and Press-Fit femoral components. The right femoral stem is not completely included in the imaging field. The left is included. No hardware failure. No fracture. Degenerative changes of the sacroiliac joints. Osteopenia. Hyperostosis. Advanced  degenerative changes of the visualized lumbar spine. Scattered vascular calcifications. Presumed left-sided injection granulomas. IMPRESSION: Bilateral hip arthroplasties.  Osteopenia with degenerative changes. Electronically Signed   By: Karen Kays M.D.   On: 04/01/2023 13:25   CT HEAD WO CONTRAST ( )  Result Date: 04/01/2023 CLINICAL DATA:  Head trauma, minor (Age >= 65y); Neck trauma (Age >= 65y). EXAM: CT HEAD WITHOUT CONTRAST CT CERVICAL SPINE WITHOUT CONTRAST TECHNIQUE: Multidetector CT imaging of the head and cervical spine was performed following the standard protocol without intravenous contrast. Multiplanar CT image reconstructions of the cervical spine were also generated. RADIATION DOSE REDUCTION: This exam was performed according to the departmental dose-optimization program which includes automated exposure control, adjustment of the mA and/or kV according to patient size and/or use of iterative reconstruction technique. COMPARISON:  Head CT 03/18/2022. MRI brain 03/20/2022. CT cervical spine 05/04/2021. FINDINGS: CT HEAD FINDINGS Brain: No acute intracranial hemorrhage. Unchanged old lacunar infarcts in the right corona radiata and left cerebellar hemisphere. Unchanged mild chronic small-vessel disease. No hydrocephalus or extra-axial collection. No mass effect or midline shift. Vascular: No hyperdense vessel or unexpected calcification. Skull: No calvarial fracture or suspicious bone lesion. Skull base is unremarkable. Sinuses/Orbits: Unremarkable. Other: None. CT CERVICAL SPINE FINDINGS Alignment:  Unchanged 3 mm degenerative anterolisthesis of C3 on C4 and C4 on C5. No traumatic malalignment. Skull base and vertebrae: No acute fracture. Intact craniocervical junction. Soft tissues and spinal canal: No prevertebral fluid or swelling. No visible canal hematoma. Disc levels: Multilevel cervical spondylosis, worst at C4-5, where there is at least moderate spinal canal stenosis. Upper chest:  Unremarkable. Other: Atherosclerotic calcifications of the carotid bulbs. IMPRESSION: 1. No acute intracranial abnormality. Unchanged old lacunar infarcts in the right corona radiata and left cerebellar hemisphere. 2. No acute cervical spine fracture. Multilevel cervical spondylosis, worst at C4-5 where there is at least moderate spinal canal stenosis. Electronically Signed   By: Orvan Falconer M.D.   On: 04/01/2023 12:25   CT Cervical Spine Wo Contrast  Result Date: 04/01/2023 CLINICAL DATA:  Head trauma, minor (Age >= 65y); Neck trauma (Age >= 65y). EXAM: CT HEAD WITHOUT CONTRAST CT CERVICAL SPINE WITHOUT CONTRAST TECHNIQUE: Multidetector CT imaging of the head and cervical spine was performed following the standard protocol without intravenous contrast. Multiplanar CT image reconstructions of the cervical spine were also generated. RADIATION DOSE REDUCTION: This exam was performed according to the departmental dose-optimization program which includes automated exposure control, adjustment of the mA and/or kV according to patient size and/or use of iterative reconstruction technique. COMPARISON:  Head CT 03/18/2022. MRI brain 03/20/2022. CT cervical spine 05/04/2021. FINDINGS: CT HEAD FINDINGS Brain: No acute intracranial hemorrhage. Unchanged old lacunar infarcts in the right corona radiata and left cerebellar hemisphere. Unchanged mild chronic small-vessel disease. No hydrocephalus or extra-axial collection. No mass effect or midline shift. Vascular: No hyperdense vessel or unexpected calcification. Skull: No calvarial fracture or suspicious bone lesion. Skull base is unremarkable. Sinuses/Orbits: Unremarkable. Other: None. CT CERVICAL SPINE FINDINGS Alignment: Unchanged 3 mm degenerative anterolisthesis of C3 on C4 and C4 on C5. No traumatic malalignment. Skull base and vertebrae: No acute fracture. Intact craniocervical junction. Soft tissues and spinal canal: No prevertebral fluid or swelling. No visible  canal hematoma. Disc levels: Multilevel cervical spondylosis, worst at C4-5, where there is at least moderate spinal canal stenosis. Upper chest: Unremarkable. Other: Atherosclerotic calcifications of the carotid bulbs. IMPRESSION: 1. No acute intracranial abnormality. Unchanged old lacunar infarcts in the right corona radiata and left cerebellar hemisphere. 2. No acute cervical spine fracture. Multilevel cervical spondylosis, worst at C4-5 where there is at least moderate spinal canal stenosis. Electronically Signed   By: Orvan Falconer M.D.   On: 04/01/2023 12:25   DG Chest Portable 1 View  Result Date: 04/01/2023 CLINICAL DATA:  Fall EXAM: PORTABLE CHEST 1 VIEW COMPARISON:  None Available. FINDINGS: No pleural effusion. No pneumothorax. No focal airspace opacity. Normal cardiac and mediastinal contours. No radiographically apparent displaced rib fractures. Visualized upper abdomen is unremarkable. IMPRESSION: No focal airspace opacity. Electronically Signed   By: Lorenza Cambridge M.D.   On: 04/01/2023 12:15     Discharge Exam: Vitals:   05/01/23 0432 05/01/23 0831  BP: (!) 140/79   Pulse: 100   Resp: 20   Temp: 97.7 F (36.5 C)   SpO2: 93% 91%   Vitals:   04/30/23 2058 04/30/23 2307 05/01/23 0432 05/01/23 0831  BP: (!) 159/97  (!) 140/79   Pulse: (!) 109  100   Resp: 20  20   Temp: 98.4 F (36.9 C)  97.7 F (36.5 C)   TempSrc: Oral  Oral   SpO2: 92% 90% 93% 91%  Weight:      Height:  General: Pt is alert, awake, not in acute distress Cardiovascular: RRR, S1/S2 +, no rubs, no gallops Respiratory: CTA bilaterally, no wheezing, no rhonchi Abdominal: Soft, NT, ND, bowel sounds + Extremities: no edema, no cyanosis    The results of significant diagnostics from this hospitalization (including imaging, microbiology, ancillary and laboratory) are listed below for reference.     Microbiology: Recent Results (from the past 240 hour(s))  Resp panel by RT-PCR (RSV, Flu A&B,  Covid) Anterior Nasal Swab     Status: None   Collection Time: 04/26/23 11:45 AM   Specimen: Anterior Nasal Swab  Result Value Ref Range Status   SARS Coronavirus 2 by RT PCR NEGATIVE NEGATIVE Final    Comment: (NOTE) SARS-CoV-2 target nucleic acids are NOT DETECTED.  The SARS-CoV-2 RNA is generally detectable in upper respiratory specimens during the acute phase of infection. The lowest concentration of SARS-CoV-2 viral copies this assay can detect is 138 copies/mL. A negative result does not preclude SARS-Cov-2 infection and should not be used as the sole basis for treatment or other patient management decisions. A negative result may occur with  improper specimen collection/handling, submission of specimen other than nasopharyngeal swab, presence of viral mutation(s) within the areas targeted by this assay, and inadequate number of viral copies(<138 copies/mL). A negative result must be combined with clinical observations, patient history, and epidemiological information. The expected result is Negative.  Fact Sheet for Patients:  BloggerCourse.com  Fact Sheet for Healthcare Providers:  SeriousBroker.it  This test is no t yet approved or cleared by the [place] FDA and  has been authorized for detection and/or diagnosis of SARS-CoV-2 by FDA under an Emergency Use Authorization (EUA). This EUA will remain  in effect (meaning this test can be used) for the duration of the COVID-19 declaration under Section 564(b)(1) of the Act, 21 U.S.C.section 360bbb-3(b)(1), unless the authorization is terminated  or revoked sooner.       Influenza A by PCR NEGATIVE NEGATIVE Final   Influenza B by PCR NEGATIVE NEGATIVE Final    Comment: (NOTE) The Xpert Xpress SARS-CoV-2/FLU/RSV plus assay is intended as an aid in the diagnosis of influenza from Nasopharyngeal swab specimens and should not be used as a sole basis for treatment. Nasal  washings and aspirates are unacceptable for Xpert Xpress SARS-CoV-2/FLU/RSV testing.  Fact Sheet for Patients: BloggerCourse.com  Fact Sheet for Healthcare Providers: SeriousBroker.it  This test is not yet approved or cleared by the [place] FDA and has been authorized for detection and/or diagnosis of SARS-CoV-2 by FDA under an Emergency Use Authorization (EUA). This EUA will remain in effect (meaning this test can be used) for the duration of the COVID-19 declaration under Section 564(b)(1) of the Act, 21 U.S.C. section 360bbb-3(b)(1), unless the authorization is terminated or revoked.     Resp Syncytial Virus by PCR NEGATIVE NEGATIVE Final    Comment: (NOTE) Fact Sheet for Patients: BloggerCourse.com  Fact Sheet for Healthcare Providers: SeriousBroker.it  This test is not yet approved or cleared by the [place] FDA and has been authorized for detection and/or diagnosis of SARS-CoV-2 by FDA under an Emergency Use Authorization (EUA). This EUA will remain in effect (meaning this test can be used) for the duration of the COVID-19 declaration under Section 564(b)(1) of the Act, 21 U.S.C. section 360bbb-3(b)(1), unless the authorization is terminated or revoked.  Performed at [facility], [address], [place], [place] [address]   Blood Culture (routine x 2)     Status: None (  Preliminary result)   Collection Time: 04/30/23  9:00 AM   Specimen: Right Antecubital; Blood  Result Value Ref Range Status   Specimen Description   Final    RIGHT ANTECUBITAL BOTTLES DRAWN AEROBIC AND ANAEROBIC   Special Requests   Final    Blood Culture results may not be optimal due to an inadequate volume of blood received in culture bottles   Culture   Final    NO GROWTH < 24 HOURS Performed at [facility], [address], [place], [place] [address]    Report Status PENDING   Incomplete  Blood Culture (routine x 2)     Status: None (Preliminary result)   Collection Time: 04/30/23 10:01 AM   Specimen: BLOOD  Result Value Ref Range Status   Specimen Description BLOOD BLOOD RIGHT HAND  Final   Special Requests   Final    BOTTLES DRAWN AEROBIC AND ANAEROBIC Blood Culture results may not be optimal due to an excessive volume of blood received in culture bottles   Culture   Final    NO GROWTH < 24 HOURS Performed at [facility], [address] [place], [place] [address]    Report Status PENDING  Incomplete  SARS Coronavirus 2 by RT PCR (hospital order, performed in [facility] hospital lab) *cepheid single result test* Anterior Nasal Swab     Status: None   Collection Time: 04/30/23  3:35 PM   Specimen: Anterior Nasal Swab  Result Value Ref Range Status   SARS Coronavirus 2 by RT PCR NEGATIVE NEGATIVE Final    Comment: (NOTE) SARS-CoV-2 target nucleic acids are NOT DETECTED.  The SARS-CoV-2 RNA is generally detectable in upper and lower respiratory specimens during the acute phase of infection. The lowest concentration of SARS-CoV-2 viral copies this assay can detect is 250 copies / mL. A negative result does not preclude SARS-CoV-2 infection and should not be used as the sole basis for treatment or other patient management decisions.  A negative result may occur with improper specimen collection / handling, submission of specimen other than nasopharyngeal swab, presence of viral mutation(s) within the areas targeted by this assay, and inadequate number of viral copies (<250 copies / mL). A negative result must be combined with clinical observations, patient history, and epidemiological information.  Fact Sheet for Patients:   RoadLapTop.co.za  Fact Sheet for Healthcare Providers: http://kim-miller.com/  This test is not yet approved or  cleared by the [place] FDA and has been authorized for detection  and/or diagnosis of SARS-CoV-2 by FDA under an Emergency Use Authorization (EUA).  This EUA will remain in effect (meaning this test can be used) for the duration of the COVID-19 declaration under Section 564(b)(1) of the Act, 21 U.S.C. section 360bbb-3(b)(1), unless the authorization is terminated or revoked sooner.  Performed at [facility], [address], [place], [place] [address]      Labs: BNP (last 3 results) Recent Labs    04/18/23 1638 04/26/23 1150  BNP 246.0* 168.0*   Basic Metabolic Panel: Recent Labs  Lab 04/26/23 1150 04/27/23 0445 04/30/23 0850 05/01/23 0422  NA 139 137 137 141  K 4.4 3.9 4.3 4.4  CL 103 105 106 109  CO2 23 17* 22 23  GLUCOSE 109* 128* 98 125*  BUN 32* 27* 24* 21  CREATININE 1.70* 1.40* 1.42* 1.26*  CALCIUM 8.9 8.6* 9.3 9.2  MG 2.3  --   --   --    Liver Function Tests: Recent Labs  Lab  04/26/23 1150 04/30/23 0850  AST 31 33  ALT 18 25  ALKPHOS 64 62  BILITOT 1.5* 1.4*  PROT 6.8 6.7  ALBUMIN 3.6 3.6   No results for input(s): "LIPASE", "AMYLASE" in the last 168 hours. No results for input(s): "AMMONIA" in the last 168 hours. CBC: Recent Labs  Lab 04/26/23 1150 04/30/23 0850  WBC 4.6 4.1  NEUTROABS 3.6 3.0  HGB 11.8* 11.4*  HCT 36.5 35.3*  MCV 99.5 99.2  PLT 114* 129*   Cardiac Enzymes: No results for input(s): "CKTOTAL", "CKMB", "CKMBINDEX", "TROPONINI" in the last 168 hours. BNP: Invalid input(s): "POCBNP" CBG: No results for input(s): "GLUCAP" in the last 168 hours. D-Dimer No results for input(s): "DDIMER" in the last 72 hours. Hgb A1c No results for input(s): "HGBA1C" in the last 72 hours. Lipid Profile No results for input(s): "CHOL", "HDL", "LDLCALC", "TRIG", "CHOLHDL", "LDLDIRECT" in the last 72 hours. Thyroid function studies No results for input(s): "TSH", "T4TOTAL", "T3FREE", "THYROIDAB" in the last 72 hours.  Invalid input(s): "FREET3" Anemia work up No results for input(s): "VITAMINB12",  "FOLATE", "FERRITIN", "TIBC", "IRON", "RETICCTPCT" in the last 72 hours. Urinalysis    Component Value Date/Time   COLORURINE STRAW (A) 04/30/2023 1103   APPEARANCEUR CLEAR 04/30/2023 1103   LABSPEC 1.006 04/30/2023 1103   PHURINE 7.0 04/30/2023 1103   GLUCOSEU NEGATIVE 04/30/2023 1103   HGBUR NEGATIVE 04/30/2023 1103   BILIRUBINUR NEGATIVE 04/30/2023 1103   KETONESUR NEGATIVE 04/30/2023 1103   PROTEINUR 30 (A) 04/30/2023 1103   UROBILINOGEN 0.2 03/09/2012 1107   NITRITE NEGATIVE 04/30/2023 1103   LEUKOCYTESUR NEGATIVE 04/30/2023 1103   Sepsis Labs Recent Labs  Lab 04/26/23 1150 04/30/23 0850  WBC 4.6 4.1   Microbiology Recent Results (from the past 240 hour(s))  Resp panel by RT-PCR (RSV, Flu A&B, Covid) Anterior Nasal Swab     Status: None   Collection Time: 04/26/23 11:45 AM   Specimen: Anterior Nasal Swab  Result Value Ref Range Status   SARS Coronavirus 2 by RT PCR NEGATIVE NEGATIVE Final    Comment: (NOTE) SARS-CoV-2 target nucleic acids are NOT DETECTED.  The SARS-CoV-2 RNA is generally detectable in upper respiratory specimens during the acute phase of infection. The lowest concentration of SARS-CoV-2 viral copies this assay can detect is 138 copies/mL. A negative result does not preclude SARS-Cov-2 infection and should not be used as the sole basis for treatment or other patient management decisions. A negative result may occur with  improper specimen collection/handling, submission of specimen other than nasopharyngeal swab, presence of viral mutation(s) within the areas targeted by this assay, and inadequate number of viral copies(<138 copies/mL). A negative result must be combined with clinical observations, patient history, and epidemiological information. The expected result is Negative.  Fact Sheet for Patients:  BloggerCourse.com  Fact Sheet for Healthcare Providers:  SeriousBroker.it  This test is  no t yet approved or cleared by the [place] FDA and  has been authorized for detection and/or diagnosis of SARS-CoV-2 by FDA under an Emergency Use Authorization (EUA). This EUA will remain  in effect (meaning this test can be used) for the duration of the COVID-19 declaration under Section 564(b)(1) of the Act, 21 U.S.C.section 360bbb-3(b)(1), unless the authorization is terminated  or revoked sooner.       Influenza A by PCR NEGATIVE NEGATIVE Final   Influenza B by PCR NEGATIVE NEGATIVE Final    Comment: (NOTE) The Xpert Xpress SARS-CoV-2/FLU/RSV plus assay is intended as an aid in the diagnosis  of influenza from Nasopharyngeal swab specimens and should not be used as a sole basis for treatment. Nasal washings and aspirates are unacceptable for Xpert Xpress SARS-CoV-2/FLU/RSV testing.  Fact Sheet for Patients: BloggerCourse.com  Fact Sheet for Healthcare Providers: SeriousBroker.it  This test is not yet approved or cleared by the [place] FDA and has been authorized for detection and/or diagnosis of SARS-CoV-2 by FDA under an Emergency Use Authorization (EUA). This EUA will remain in effect (meaning this test can be used) for the duration of the COVID-19 declaration under Section 564(b)(1) of the Act, 21 U.S.C. section 360bbb-3(b)(1), unless the authorization is terminated or revoked.     Resp Syncytial Virus by PCR NEGATIVE NEGATIVE Final    Comment: (NOTE) Fact Sheet for Patients: BloggerCourse.com  Fact Sheet for Healthcare Providers: SeriousBroker.it  This test is not yet approved or cleared by the [place] FDA and has been authorized for detection and/or diagnosis of SARS-CoV-2 by FDA under an Emergency Use Authorization (EUA). This EUA will remain in effect (meaning this test can be used) for the duration of the COVID-19 declaration under Section  564(b)(1) of the Act, 21 U.S.C. section 360bbb-3(b)(1), unless the authorization is terminated or revoked.  Performed at [facility], [address] [place], [place] [address]   Blood Culture (routine x 2)     Status: None (Preliminary result)   Collection Time: 04/30/23  9:00 AM   Specimen: Right Antecubital; Blood  Result Value Ref Range Status   Specimen Description   Final    RIGHT ANTECUBITAL BOTTLES DRAWN AEROBIC AND ANAEROBIC   Special Requests   Final    Blood Culture results may not be optimal due to an inadequate volume of blood received in culture bottles   Culture   Final    NO GROWTH < 24 HOURS Performed at [facility], [address] [place], [place] [address]    Report Status PENDING  Incomplete  Blood Culture (routine x 2)     Status: None (Preliminary result)   Collection Time: 04/30/23 10:01 AM   Specimen: BLOOD  Result Value Ref Range Status   Specimen Description BLOOD BLOOD RIGHT HAND  Final   Special Requests   Final    BOTTLES DRAWN AEROBIC AND ANAEROBIC Blood Culture results may not be optimal due to an excessive volume of blood received in culture bottles   Culture   Final    NO GROWTH < 24 HOURS Performed at [facility], [address], [place], [place] [address]    Report Status PENDING  Incomplete  SARS Coronavirus 2 by RT PCR (hospital order, performed in [facility] hospital lab) *cepheid single result test* Anterior Nasal Swab     Status: None   Collection Time: 04/30/23  3:35 PM   Specimen: Anterior Nasal Swab  Result Value Ref Range Status   SARS Coronavirus 2 by RT PCR NEGATIVE NEGATIVE Final    Comment: (NOTE) SARS-CoV-2 target nucleic acids are NOT DETECTED.  The SARS-CoV-2 RNA is generally detectable in upper and lower respiratory specimens during the acute phase of infection. The lowest concentration of SARS-CoV-2 viral copies this assay can detect is 250 copies / mL. A negative result does not preclude SARS-CoV-2 infection and should  not be used as the sole basis for treatment or other patient management decisions.  A negative result may occur with improper specimen collection / handling, submission of specimen other than nasopharyngeal swab, presence of viral mutation(s) within the areas targeted by this assay,  and inadequate number of viral copies (<250 copies / mL). A negative result must be combined with clinical observations, patient history, and epidemiological information.  Fact Sheet for Patients:   RoadLapTop.co.za  Fact Sheet for Healthcare Providers: http://kim-miller.com/  This test is not yet approved or  cleared by the [place] FDA and has been authorized for detection and/or diagnosis of SARS-CoV-2 by FDA under an Emergency Use Authorization (EUA).  This EUA will remain in effect (meaning this test can be used) for the duration of the COVID-19 declaration under Section 564(b)(1) of the Act, 21 U.S.C. section 360bbb-3(b)(1), unless the authorization is terminated or revoked sooner.  Performed at [facility], [address], [place], [place] [address]      Time coordinating discharge: 35 minutes  SIGNED:   Erick Blinks, DO [facility] 05/01/2023, 10:13 AM  If 7PM-7AM, please contact night-coverage [email or web address]

## 2023-05-02 LAB — CULTURE, BLOOD (ROUTINE X 2)

## 2023-05-03 DIAGNOSIS — R0600 Dyspnea, unspecified: Secondary | ICD-10-CM | POA: Diagnosis not present

## 2023-05-03 DIAGNOSIS — R059 Cough, unspecified: Secondary | ICD-10-CM | POA: Diagnosis not present

## 2023-05-03 DIAGNOSIS — N179 Acute kidney failure, unspecified: Secondary | ICD-10-CM | POA: Diagnosis not present

## 2023-05-04 DIAGNOSIS — I10 Essential (primary) hypertension: Secondary | ICD-10-CM | POA: Diagnosis not present

## 2023-05-04 DIAGNOSIS — M6281 Muscle weakness (generalized): Secondary | ICD-10-CM | POA: Diagnosis not present

## 2023-05-04 DIAGNOSIS — G629 Polyneuropathy, unspecified: Secondary | ICD-10-CM | POA: Diagnosis not present

## 2023-05-04 DIAGNOSIS — M48062 Spinal stenosis, lumbar region with neurogenic claudication: Secondary | ICD-10-CM | POA: Diagnosis not present

## 2023-05-04 DIAGNOSIS — R262 Difficulty in walking, not elsewhere classified: Secondary | ICD-10-CM | POA: Diagnosis not present

## 2023-05-04 LAB — CULTURE, BLOOD (ROUTINE X 2): Culture: NO GROWTH

## 2023-05-05 LAB — CULTURE, BLOOD (ROUTINE X 2): Culture: NO GROWTH

## 2023-05-06 DIAGNOSIS — M48062 Spinal stenosis, lumbar region with neurogenic claudication: Secondary | ICD-10-CM | POA: Diagnosis not present

## 2023-05-06 DIAGNOSIS — I10 Essential (primary) hypertension: Secondary | ICD-10-CM | POA: Diagnosis not present

## 2023-05-06 DIAGNOSIS — R262 Difficulty in walking, not elsewhere classified: Secondary | ICD-10-CM | POA: Diagnosis not present

## 2023-05-06 DIAGNOSIS — G629 Polyneuropathy, unspecified: Secondary | ICD-10-CM | POA: Diagnosis not present

## 2023-05-06 DIAGNOSIS — M6281 Muscle weakness (generalized): Secondary | ICD-10-CM | POA: Diagnosis not present

## 2023-05-07 DIAGNOSIS — R262 Difficulty in walking, not elsewhere classified: Secondary | ICD-10-CM | POA: Diagnosis not present

## 2023-05-07 DIAGNOSIS — M6281 Muscle weakness (generalized): Secondary | ICD-10-CM | POA: Diagnosis not present

## 2023-05-07 DIAGNOSIS — I10 Essential (primary) hypertension: Secondary | ICD-10-CM | POA: Diagnosis not present

## 2023-05-10 DIAGNOSIS — M6281 Muscle weakness (generalized): Secondary | ICD-10-CM | POA: Diagnosis not present

## 2023-05-10 DIAGNOSIS — I10 Essential (primary) hypertension: Secondary | ICD-10-CM | POA: Diagnosis not present

## 2023-05-10 DIAGNOSIS — R262 Difficulty in walking, not elsewhere classified: Secondary | ICD-10-CM | POA: Diagnosis not present

## 2023-05-11 DIAGNOSIS — G629 Polyneuropathy, unspecified: Secondary | ICD-10-CM | POA: Diagnosis not present

## 2023-05-11 DIAGNOSIS — M48062 Spinal stenosis, lumbar region with neurogenic claudication: Secondary | ICD-10-CM | POA: Diagnosis not present

## 2023-05-11 DIAGNOSIS — R262 Difficulty in walking, not elsewhere classified: Secondary | ICD-10-CM | POA: Diagnosis not present

## 2023-05-11 DIAGNOSIS — M6281 Muscle weakness (generalized): Secondary | ICD-10-CM | POA: Diagnosis not present

## 2023-05-11 DIAGNOSIS — I10 Essential (primary) hypertension: Secondary | ICD-10-CM | POA: Diagnosis not present

## 2023-05-13 DIAGNOSIS — M6281 Muscle weakness (generalized): Secondary | ICD-10-CM | POA: Diagnosis not present

## 2023-05-13 DIAGNOSIS — G629 Polyneuropathy, unspecified: Secondary | ICD-10-CM | POA: Diagnosis not present

## 2023-05-13 DIAGNOSIS — I10 Essential (primary) hypertension: Secondary | ICD-10-CM | POA: Diagnosis not present

## 2023-05-13 DIAGNOSIS — M48062 Spinal stenosis, lumbar region with neurogenic claudication: Secondary | ICD-10-CM | POA: Diagnosis not present

## 2023-05-13 DIAGNOSIS — R262 Difficulty in walking, not elsewhere classified: Secondary | ICD-10-CM | POA: Diagnosis not present

## 2023-05-15 DIAGNOSIS — R262 Difficulty in walking, not elsewhere classified: Secondary | ICD-10-CM | POA: Diagnosis not present

## 2023-05-15 DIAGNOSIS — M6281 Muscle weakness (generalized): Secondary | ICD-10-CM | POA: Diagnosis not present

## 2023-05-15 DIAGNOSIS — I10 Essential (primary) hypertension: Secondary | ICD-10-CM | POA: Diagnosis not present

## 2023-05-17 DIAGNOSIS — I10 Essential (primary) hypertension: Secondary | ICD-10-CM | POA: Diagnosis not present

## 2023-05-17 DIAGNOSIS — M6281 Muscle weakness (generalized): Secondary | ICD-10-CM | POA: Diagnosis not present

## 2023-05-17 DIAGNOSIS — G894 Chronic pain syndrome: Secondary | ICD-10-CM | POA: Diagnosis not present

## 2023-05-17 DIAGNOSIS — R262 Difficulty in walking, not elsewhere classified: Secondary | ICD-10-CM | POA: Diagnosis not present

## 2023-05-18 DIAGNOSIS — M48062 Spinal stenosis, lumbar region with neurogenic claudication: Secondary | ICD-10-CM | POA: Diagnosis not present

## 2023-05-18 DIAGNOSIS — G629 Polyneuropathy, unspecified: Secondary | ICD-10-CM | POA: Diagnosis not present

## 2023-05-18 DIAGNOSIS — R262 Difficulty in walking, not elsewhere classified: Secondary | ICD-10-CM | POA: Diagnosis not present

## 2023-05-18 DIAGNOSIS — I10 Essential (primary) hypertension: Secondary | ICD-10-CM | POA: Diagnosis not present

## 2023-05-18 DIAGNOSIS — M6281 Muscle weakness (generalized): Secondary | ICD-10-CM | POA: Diagnosis not present

## 2023-05-19 DIAGNOSIS — R262 Difficulty in walking, not elsewhere classified: Secondary | ICD-10-CM | POA: Diagnosis not present

## 2023-05-19 DIAGNOSIS — I10 Essential (primary) hypertension: Secondary | ICD-10-CM | POA: Diagnosis not present

## 2023-05-19 DIAGNOSIS — M6281 Muscle weakness (generalized): Secondary | ICD-10-CM | POA: Diagnosis not present

## 2023-05-20 DIAGNOSIS — M48062 Spinal stenosis, lumbar region with neurogenic claudication: Secondary | ICD-10-CM | POA: Diagnosis not present

## 2023-05-20 DIAGNOSIS — R262 Difficulty in walking, not elsewhere classified: Secondary | ICD-10-CM | POA: Diagnosis not present

## 2023-05-20 DIAGNOSIS — M6281 Muscle weakness (generalized): Secondary | ICD-10-CM | POA: Diagnosis not present

## 2023-05-20 DIAGNOSIS — I10 Essential (primary) hypertension: Secondary | ICD-10-CM | POA: Diagnosis not present

## 2023-05-20 DIAGNOSIS — G629 Polyneuropathy, unspecified: Secondary | ICD-10-CM | POA: Diagnosis not present

## 2023-05-24 DIAGNOSIS — I10 Essential (primary) hypertension: Secondary | ICD-10-CM | POA: Diagnosis not present

## 2023-05-24 DIAGNOSIS — K5900 Constipation, unspecified: Secondary | ICD-10-CM | POA: Diagnosis not present

## 2023-05-24 DIAGNOSIS — R262 Difficulty in walking, not elsewhere classified: Secondary | ICD-10-CM | POA: Diagnosis not present

## 2023-05-24 DIAGNOSIS — M6281 Muscle weakness (generalized): Secondary | ICD-10-CM | POA: Diagnosis not present

## 2023-05-24 DIAGNOSIS — M545 Low back pain, unspecified: Secondary | ICD-10-CM | POA: Diagnosis not present

## 2023-05-24 DIAGNOSIS — E782 Mixed hyperlipidemia: Secondary | ICD-10-CM | POA: Diagnosis not present

## 2023-05-25 DIAGNOSIS — R262 Difficulty in walking, not elsewhere classified: Secondary | ICD-10-CM | POA: Diagnosis not present

## 2023-05-25 DIAGNOSIS — M48062 Spinal stenosis, lumbar region with neurogenic claudication: Secondary | ICD-10-CM | POA: Diagnosis not present

## 2023-05-25 DIAGNOSIS — G629 Polyneuropathy, unspecified: Secondary | ICD-10-CM | POA: Diagnosis not present

## 2023-05-25 DIAGNOSIS — I10 Essential (primary) hypertension: Secondary | ICD-10-CM | POA: Diagnosis not present

## 2023-05-25 DIAGNOSIS — M6281 Muscle weakness (generalized): Secondary | ICD-10-CM | POA: Diagnosis not present

## 2023-05-26 DIAGNOSIS — M6281 Muscle weakness (generalized): Secondary | ICD-10-CM | POA: Diagnosis not present

## 2023-05-26 DIAGNOSIS — R262 Difficulty in walking, not elsewhere classified: Secondary | ICD-10-CM | POA: Diagnosis not present

## 2023-05-26 DIAGNOSIS — I10 Essential (primary) hypertension: Secondary | ICD-10-CM | POA: Diagnosis not present

## 2023-05-27 DIAGNOSIS — M48062 Spinal stenosis, lumbar region with neurogenic claudication: Secondary | ICD-10-CM | POA: Diagnosis not present

## 2023-05-27 DIAGNOSIS — M6281 Muscle weakness (generalized): Secondary | ICD-10-CM | POA: Diagnosis not present

## 2023-05-27 DIAGNOSIS — I10 Essential (primary) hypertension: Secondary | ICD-10-CM | POA: Diagnosis not present

## 2023-05-27 DIAGNOSIS — G629 Polyneuropathy, unspecified: Secondary | ICD-10-CM | POA: Diagnosis not present

## 2023-05-27 DIAGNOSIS — R262 Difficulty in walking, not elsewhere classified: Secondary | ICD-10-CM | POA: Diagnosis not present

## 2023-06-01 DIAGNOSIS — M6281 Muscle weakness (generalized): Secondary | ICD-10-CM | POA: Diagnosis not present

## 2023-06-01 DIAGNOSIS — G629 Polyneuropathy, unspecified: Secondary | ICD-10-CM | POA: Diagnosis not present

## 2023-06-01 DIAGNOSIS — R262 Difficulty in walking, not elsewhere classified: Secondary | ICD-10-CM | POA: Diagnosis not present

## 2023-06-01 DIAGNOSIS — I10 Essential (primary) hypertension: Secondary | ICD-10-CM | POA: Diagnosis not present

## 2023-06-01 DIAGNOSIS — M48062 Spinal stenosis, lumbar region with neurogenic claudication: Secondary | ICD-10-CM | POA: Diagnosis not present

## 2023-06-02 DIAGNOSIS — I10 Essential (primary) hypertension: Secondary | ICD-10-CM | POA: Diagnosis not present

## 2023-06-02 DIAGNOSIS — M6281 Muscle weakness (generalized): Secondary | ICD-10-CM | POA: Diagnosis not present

## 2023-06-02 DIAGNOSIS — R262 Difficulty in walking, not elsewhere classified: Secondary | ICD-10-CM | POA: Diagnosis not present

## 2023-06-04 DIAGNOSIS — M6281 Muscle weakness (generalized): Secondary | ICD-10-CM | POA: Diagnosis not present

## 2023-06-04 DIAGNOSIS — I10 Essential (primary) hypertension: Secondary | ICD-10-CM | POA: Diagnosis not present

## 2023-06-04 DIAGNOSIS — R262 Difficulty in walking, not elsewhere classified: Secondary | ICD-10-CM | POA: Diagnosis not present

## 2023-06-05 DIAGNOSIS — R262 Difficulty in walking, not elsewhere classified: Secondary | ICD-10-CM | POA: Diagnosis not present

## 2023-06-05 DIAGNOSIS — M6281 Muscle weakness (generalized): Secondary | ICD-10-CM | POA: Diagnosis not present

## 2023-06-05 DIAGNOSIS — I10 Essential (primary) hypertension: Secondary | ICD-10-CM | POA: Diagnosis not present

## 2023-06-08 DIAGNOSIS — G629 Polyneuropathy, unspecified: Secondary | ICD-10-CM | POA: Diagnosis not present

## 2023-06-08 DIAGNOSIS — I10 Essential (primary) hypertension: Secondary | ICD-10-CM | POA: Diagnosis not present

## 2023-06-08 DIAGNOSIS — M6281 Muscle weakness (generalized): Secondary | ICD-10-CM | POA: Diagnosis not present

## 2023-06-08 DIAGNOSIS — R262 Difficulty in walking, not elsewhere classified: Secondary | ICD-10-CM | POA: Diagnosis not present

## 2023-06-08 DIAGNOSIS — M48062 Spinal stenosis, lumbar region with neurogenic claudication: Secondary | ICD-10-CM | POA: Diagnosis not present

## 2023-06-10 DIAGNOSIS — R262 Difficulty in walking, not elsewhere classified: Secondary | ICD-10-CM | POA: Diagnosis not present

## 2023-06-10 DIAGNOSIS — M48062 Spinal stenosis, lumbar region with neurogenic claudication: Secondary | ICD-10-CM | POA: Diagnosis not present

## 2023-06-10 DIAGNOSIS — G629 Polyneuropathy, unspecified: Secondary | ICD-10-CM | POA: Diagnosis not present

## 2023-06-10 DIAGNOSIS — I10 Essential (primary) hypertension: Secondary | ICD-10-CM | POA: Diagnosis not present

## 2023-06-10 DIAGNOSIS — M6281 Muscle weakness (generalized): Secondary | ICD-10-CM | POA: Diagnosis not present

## 2023-06-11 DIAGNOSIS — I10 Essential (primary) hypertension: Secondary | ICD-10-CM | POA: Diagnosis not present

## 2023-06-11 DIAGNOSIS — M6281 Muscle weakness (generalized): Secondary | ICD-10-CM | POA: Diagnosis not present

## 2023-06-11 DIAGNOSIS — R262 Difficulty in walking, not elsewhere classified: Secondary | ICD-10-CM | POA: Diagnosis not present

## 2023-06-14 DIAGNOSIS — M6281 Muscle weakness (generalized): Secondary | ICD-10-CM | POA: Diagnosis not present

## 2023-06-14 DIAGNOSIS — I10 Essential (primary) hypertension: Secondary | ICD-10-CM | POA: Diagnosis not present

## 2023-06-14 DIAGNOSIS — R262 Difficulty in walking, not elsewhere classified: Secondary | ICD-10-CM | POA: Diagnosis not present

## 2023-06-15 DIAGNOSIS — I10 Essential (primary) hypertension: Secondary | ICD-10-CM | POA: Diagnosis not present

## 2023-06-15 DIAGNOSIS — M48062 Spinal stenosis, lumbar region with neurogenic claudication: Secondary | ICD-10-CM | POA: Diagnosis not present

## 2023-06-15 DIAGNOSIS — R262 Difficulty in walking, not elsewhere classified: Secondary | ICD-10-CM | POA: Diagnosis not present

## 2023-06-15 DIAGNOSIS — G629 Polyneuropathy, unspecified: Secondary | ICD-10-CM | POA: Diagnosis not present

## 2023-06-15 DIAGNOSIS — M6281 Muscle weakness (generalized): Secondary | ICD-10-CM | POA: Diagnosis not present

## 2023-06-17 DIAGNOSIS — I10 Essential (primary) hypertension: Secondary | ICD-10-CM | POA: Diagnosis not present

## 2023-06-17 DIAGNOSIS — M6281 Muscle weakness (generalized): Secondary | ICD-10-CM | POA: Diagnosis not present

## 2023-06-17 DIAGNOSIS — R262 Difficulty in walking, not elsewhere classified: Secondary | ICD-10-CM | POA: Diagnosis not present

## 2023-06-21 DIAGNOSIS — R262 Difficulty in walking, not elsewhere classified: Secondary | ICD-10-CM | POA: Diagnosis not present

## 2023-06-21 DIAGNOSIS — L03116 Cellulitis of left lower limb: Secondary | ICD-10-CM | POA: Diagnosis not present

## 2023-06-21 DIAGNOSIS — I10 Essential (primary) hypertension: Secondary | ICD-10-CM | POA: Diagnosis not present

## 2023-06-21 DIAGNOSIS — M6281 Muscle weakness (generalized): Secondary | ICD-10-CM | POA: Diagnosis not present

## 2023-06-22 DIAGNOSIS — M6281 Muscle weakness (generalized): Secondary | ICD-10-CM | POA: Diagnosis not present

## 2023-06-22 DIAGNOSIS — G629 Polyneuropathy, unspecified: Secondary | ICD-10-CM | POA: Diagnosis not present

## 2023-06-22 DIAGNOSIS — I10 Essential (primary) hypertension: Secondary | ICD-10-CM | POA: Diagnosis not present

## 2023-06-22 DIAGNOSIS — R262 Difficulty in walking, not elsewhere classified: Secondary | ICD-10-CM | POA: Diagnosis not present

## 2023-06-22 DIAGNOSIS — M48062 Spinal stenosis, lumbar region with neurogenic claudication: Secondary | ICD-10-CM | POA: Diagnosis not present

## 2023-06-24 DIAGNOSIS — M6281 Muscle weakness (generalized): Secondary | ICD-10-CM | POA: Diagnosis not present

## 2023-06-24 DIAGNOSIS — R262 Difficulty in walking, not elsewhere classified: Secondary | ICD-10-CM | POA: Diagnosis not present

## 2023-06-24 DIAGNOSIS — M48062 Spinal stenosis, lumbar region with neurogenic claudication: Secondary | ICD-10-CM | POA: Diagnosis not present

## 2023-06-24 DIAGNOSIS — I10 Essential (primary) hypertension: Secondary | ICD-10-CM | POA: Diagnosis not present

## 2023-06-24 DIAGNOSIS — G629 Polyneuropathy, unspecified: Secondary | ICD-10-CM | POA: Diagnosis not present

## 2023-06-25 DIAGNOSIS — I10 Essential (primary) hypertension: Secondary | ICD-10-CM | POA: Diagnosis not present

## 2023-06-25 DIAGNOSIS — R262 Difficulty in walking, not elsewhere classified: Secondary | ICD-10-CM | POA: Diagnosis not present

## 2023-06-25 DIAGNOSIS — I6350 Cerebral infarction due to unspecified occlusion or stenosis of unspecified cerebral artery: Secondary | ICD-10-CM | POA: Diagnosis not present

## 2023-06-25 DIAGNOSIS — M6281 Muscle weakness (generalized): Secondary | ICD-10-CM | POA: Diagnosis not present

## 2023-06-28 DIAGNOSIS — I10 Essential (primary) hypertension: Secondary | ICD-10-CM | POA: Diagnosis not present

## 2023-06-28 DIAGNOSIS — R262 Difficulty in walking, not elsewhere classified: Secondary | ICD-10-CM | POA: Diagnosis not present

## 2023-06-28 DIAGNOSIS — M6281 Muscle weakness (generalized): Secondary | ICD-10-CM | POA: Diagnosis not present

## 2023-06-29 DIAGNOSIS — I10 Essential (primary) hypertension: Secondary | ICD-10-CM | POA: Diagnosis not present

## 2023-06-29 DIAGNOSIS — G629 Polyneuropathy, unspecified: Secondary | ICD-10-CM | POA: Diagnosis not present

## 2023-06-29 DIAGNOSIS — M48062 Spinal stenosis, lumbar region with neurogenic claudication: Secondary | ICD-10-CM | POA: Diagnosis not present

## 2023-06-29 DIAGNOSIS — M6281 Muscle weakness (generalized): Secondary | ICD-10-CM | POA: Diagnosis not present

## 2023-06-29 DIAGNOSIS — R262 Difficulty in walking, not elsewhere classified: Secondary | ICD-10-CM | POA: Diagnosis not present

## 2023-07-01 DIAGNOSIS — G629 Polyneuropathy, unspecified: Secondary | ICD-10-CM | POA: Diagnosis not present

## 2023-07-01 DIAGNOSIS — I10 Essential (primary) hypertension: Secondary | ICD-10-CM | POA: Diagnosis not present

## 2023-07-01 DIAGNOSIS — M48062 Spinal stenosis, lumbar region with neurogenic claudication: Secondary | ICD-10-CM | POA: Diagnosis not present

## 2023-07-01 DIAGNOSIS — M6281 Muscle weakness (generalized): Secondary | ICD-10-CM | POA: Diagnosis not present

## 2023-07-01 DIAGNOSIS — R262 Difficulty in walking, not elsewhere classified: Secondary | ICD-10-CM | POA: Diagnosis not present

## 2023-07-05 DIAGNOSIS — M6281 Muscle weakness (generalized): Secondary | ICD-10-CM | POA: Diagnosis not present

## 2023-07-05 DIAGNOSIS — I10 Essential (primary) hypertension: Secondary | ICD-10-CM | POA: Diagnosis not present

## 2023-07-05 DIAGNOSIS — R262 Difficulty in walking, not elsewhere classified: Secondary | ICD-10-CM | POA: Diagnosis not present

## 2023-07-06 DIAGNOSIS — M48062 Spinal stenosis, lumbar region with neurogenic claudication: Secondary | ICD-10-CM | POA: Diagnosis not present

## 2023-07-06 DIAGNOSIS — I10 Essential (primary) hypertension: Secondary | ICD-10-CM | POA: Diagnosis not present

## 2023-07-06 DIAGNOSIS — M6281 Muscle weakness (generalized): Secondary | ICD-10-CM | POA: Diagnosis not present

## 2023-07-06 DIAGNOSIS — G629 Polyneuropathy, unspecified: Secondary | ICD-10-CM | POA: Diagnosis not present

## 2023-07-06 DIAGNOSIS — R262 Difficulty in walking, not elsewhere classified: Secondary | ICD-10-CM | POA: Diagnosis not present

## 2023-07-08 DIAGNOSIS — R262 Difficulty in walking, not elsewhere classified: Secondary | ICD-10-CM | POA: Diagnosis not present

## 2023-07-08 DIAGNOSIS — M48062 Spinal stenosis, lumbar region with neurogenic claudication: Secondary | ICD-10-CM | POA: Diagnosis not present

## 2023-07-08 DIAGNOSIS — I10 Essential (primary) hypertension: Secondary | ICD-10-CM | POA: Diagnosis not present

## 2023-07-08 DIAGNOSIS — M6281 Muscle weakness (generalized): Secondary | ICD-10-CM | POA: Diagnosis not present

## 2023-07-08 DIAGNOSIS — G629 Polyneuropathy, unspecified: Secondary | ICD-10-CM | POA: Diagnosis not present

## 2023-07-10 DIAGNOSIS — I10 Essential (primary) hypertension: Secondary | ICD-10-CM | POA: Diagnosis not present

## 2023-07-10 DIAGNOSIS — R262 Difficulty in walking, not elsewhere classified: Secondary | ICD-10-CM | POA: Diagnosis not present

## 2023-07-10 DIAGNOSIS — M6281 Muscle weakness (generalized): Secondary | ICD-10-CM | POA: Diagnosis not present

## 2023-07-12 DIAGNOSIS — M6281 Muscle weakness (generalized): Secondary | ICD-10-CM | POA: Diagnosis not present

## 2023-07-12 DIAGNOSIS — R262 Difficulty in walking, not elsewhere classified: Secondary | ICD-10-CM | POA: Diagnosis not present

## 2023-07-12 DIAGNOSIS — I10 Essential (primary) hypertension: Secondary | ICD-10-CM | POA: Diagnosis not present

## 2023-07-13 DIAGNOSIS — G629 Polyneuropathy, unspecified: Secondary | ICD-10-CM | POA: Diagnosis not present

## 2023-07-13 DIAGNOSIS — I10 Essential (primary) hypertension: Secondary | ICD-10-CM | POA: Diagnosis not present

## 2023-07-13 DIAGNOSIS — M6281 Muscle weakness (generalized): Secondary | ICD-10-CM | POA: Diagnosis not present

## 2023-07-13 DIAGNOSIS — R262 Difficulty in walking, not elsewhere classified: Secondary | ICD-10-CM | POA: Diagnosis not present

## 2023-07-13 DIAGNOSIS — M48062 Spinal stenosis, lumbar region with neurogenic claudication: Secondary | ICD-10-CM | POA: Diagnosis not present

## 2023-07-15 DIAGNOSIS — I10 Essential (primary) hypertension: Secondary | ICD-10-CM | POA: Diagnosis not present

## 2023-07-15 DIAGNOSIS — M48062 Spinal stenosis, lumbar region with neurogenic claudication: Secondary | ICD-10-CM | POA: Diagnosis not present

## 2023-07-15 DIAGNOSIS — R262 Difficulty in walking, not elsewhere classified: Secondary | ICD-10-CM | POA: Diagnosis not present

## 2023-07-15 DIAGNOSIS — M6281 Muscle weakness (generalized): Secondary | ICD-10-CM | POA: Diagnosis not present

## 2023-07-15 DIAGNOSIS — G629 Polyneuropathy, unspecified: Secondary | ICD-10-CM | POA: Diagnosis not present

## 2023-07-16 DIAGNOSIS — R262 Difficulty in walking, not elsewhere classified: Secondary | ICD-10-CM | POA: Diagnosis not present

## 2023-07-16 DIAGNOSIS — I10 Essential (primary) hypertension: Secondary | ICD-10-CM | POA: Diagnosis not present

## 2023-07-16 DIAGNOSIS — M6281 Muscle weakness (generalized): Secondary | ICD-10-CM | POA: Diagnosis not present

## 2023-07-19 DIAGNOSIS — M170 Bilateral primary osteoarthritis of knee: Secondary | ICD-10-CM | POA: Diagnosis not present

## 2023-07-19 DIAGNOSIS — I110 Hypertensive heart disease with heart failure: Secondary | ICD-10-CM | POA: Diagnosis not present

## 2023-07-19 DIAGNOSIS — E559 Vitamin D deficiency, unspecified: Secondary | ICD-10-CM | POA: Diagnosis not present

## 2023-07-23 DIAGNOSIS — I10 Essential (primary) hypertension: Secondary | ICD-10-CM | POA: Diagnosis not present

## 2023-07-23 DIAGNOSIS — I6350 Cerebral infarction due to unspecified occlusion or stenosis of unspecified cerebral artery: Secondary | ICD-10-CM | POA: Diagnosis not present

## 2023-07-26 DIAGNOSIS — R262 Difficulty in walking, not elsewhere classified: Secondary | ICD-10-CM | POA: Diagnosis not present

## 2023-07-26 DIAGNOSIS — I10 Essential (primary) hypertension: Secondary | ICD-10-CM | POA: Diagnosis not present

## 2023-07-26 DIAGNOSIS — M6281 Muscle weakness (generalized): Secondary | ICD-10-CM | POA: Diagnosis not present

## 2023-07-27 DIAGNOSIS — M6281 Muscle weakness (generalized): Secondary | ICD-10-CM | POA: Diagnosis not present

## 2023-07-27 DIAGNOSIS — R262 Difficulty in walking, not elsewhere classified: Secondary | ICD-10-CM | POA: Diagnosis not present

## 2023-07-27 DIAGNOSIS — G629 Polyneuropathy, unspecified: Secondary | ICD-10-CM | POA: Diagnosis not present

## 2023-07-27 DIAGNOSIS — I10 Essential (primary) hypertension: Secondary | ICD-10-CM | POA: Diagnosis not present

## 2023-07-27 DIAGNOSIS — M48062 Spinal stenosis, lumbar region with neurogenic claudication: Secondary | ICD-10-CM | POA: Diagnosis not present

## 2023-07-29 DIAGNOSIS — I10 Essential (primary) hypertension: Secondary | ICD-10-CM | POA: Diagnosis not present

## 2023-07-29 DIAGNOSIS — M48062 Spinal stenosis, lumbar region with neurogenic claudication: Secondary | ICD-10-CM | POA: Diagnosis not present

## 2023-07-29 DIAGNOSIS — M6281 Muscle weakness (generalized): Secondary | ICD-10-CM | POA: Diagnosis not present

## 2023-07-29 DIAGNOSIS — G629 Polyneuropathy, unspecified: Secondary | ICD-10-CM | POA: Diagnosis not present

## 2023-07-29 DIAGNOSIS — R262 Difficulty in walking, not elsewhere classified: Secondary | ICD-10-CM | POA: Diagnosis not present

## 2023-08-03 DIAGNOSIS — M6281 Muscle weakness (generalized): Secondary | ICD-10-CM | POA: Diagnosis not present

## 2023-08-03 DIAGNOSIS — I10 Essential (primary) hypertension: Secondary | ICD-10-CM | POA: Diagnosis not present

## 2023-08-03 DIAGNOSIS — R262 Difficulty in walking, not elsewhere classified: Secondary | ICD-10-CM | POA: Diagnosis not present

## 2023-08-03 DIAGNOSIS — M48062 Spinal stenosis, lumbar region with neurogenic claudication: Secondary | ICD-10-CM | POA: Diagnosis not present

## 2023-08-03 DIAGNOSIS — G629 Polyneuropathy, unspecified: Secondary | ICD-10-CM | POA: Diagnosis not present

## 2023-08-05 DIAGNOSIS — R262 Difficulty in walking, not elsewhere classified: Secondary | ICD-10-CM | POA: Diagnosis not present

## 2023-08-05 DIAGNOSIS — M6281 Muscle weakness (generalized): Secondary | ICD-10-CM | POA: Diagnosis not present

## 2023-08-05 DIAGNOSIS — I10 Essential (primary) hypertension: Secondary | ICD-10-CM | POA: Diagnosis not present

## 2023-08-05 DIAGNOSIS — M48062 Spinal stenosis, lumbar region with neurogenic claudication: Secondary | ICD-10-CM | POA: Diagnosis not present

## 2023-08-05 DIAGNOSIS — G629 Polyneuropathy, unspecified: Secondary | ICD-10-CM | POA: Diagnosis not present

## 2023-08-10 DIAGNOSIS — I10 Essential (primary) hypertension: Secondary | ICD-10-CM | POA: Diagnosis not present

## 2023-08-10 DIAGNOSIS — G629 Polyneuropathy, unspecified: Secondary | ICD-10-CM | POA: Diagnosis not present

## 2023-08-10 DIAGNOSIS — M48062 Spinal stenosis, lumbar region with neurogenic claudication: Secondary | ICD-10-CM | POA: Diagnosis not present

## 2023-08-10 DIAGNOSIS — R262 Difficulty in walking, not elsewhere classified: Secondary | ICD-10-CM | POA: Diagnosis not present

## 2023-08-10 DIAGNOSIS — M6281 Muscle weakness (generalized): Secondary | ICD-10-CM | POA: Diagnosis not present

## 2023-08-12 DIAGNOSIS — G629 Polyneuropathy, unspecified: Secondary | ICD-10-CM | POA: Diagnosis not present

## 2023-08-12 DIAGNOSIS — M6281 Muscle weakness (generalized): Secondary | ICD-10-CM | POA: Diagnosis not present

## 2023-08-12 DIAGNOSIS — I10 Essential (primary) hypertension: Secondary | ICD-10-CM | POA: Diagnosis not present

## 2023-08-12 DIAGNOSIS — M48062 Spinal stenosis, lumbar region with neurogenic claudication: Secondary | ICD-10-CM | POA: Diagnosis not present

## 2023-08-12 DIAGNOSIS — R262 Difficulty in walking, not elsewhere classified: Secondary | ICD-10-CM | POA: Diagnosis not present

## 2023-08-13 DIAGNOSIS — N390 Urinary tract infection, site not specified: Secondary | ICD-10-CM | POA: Diagnosis not present

## 2023-08-13 DIAGNOSIS — N179 Acute kidney failure, unspecified: Secondary | ICD-10-CM | POA: Diagnosis not present

## 2023-08-13 DIAGNOSIS — I509 Heart failure, unspecified: Secondary | ICD-10-CM | POA: Diagnosis not present

## 2023-08-16 DIAGNOSIS — N390 Urinary tract infection, site not specified: Secondary | ICD-10-CM | POA: Diagnosis not present

## 2023-08-17 DIAGNOSIS — M6281 Muscle weakness (generalized): Secondary | ICD-10-CM | POA: Diagnosis not present

## 2023-08-17 DIAGNOSIS — I10 Essential (primary) hypertension: Secondary | ICD-10-CM | POA: Diagnosis not present

## 2023-08-17 DIAGNOSIS — M48062 Spinal stenosis, lumbar region with neurogenic claudication: Secondary | ICD-10-CM | POA: Diagnosis not present

## 2023-08-17 DIAGNOSIS — R262 Difficulty in walking, not elsewhere classified: Secondary | ICD-10-CM | POA: Diagnosis not present

## 2023-08-17 DIAGNOSIS — G629 Polyneuropathy, unspecified: Secondary | ICD-10-CM | POA: Diagnosis not present

## 2023-08-19 DIAGNOSIS — M6281 Muscle weakness (generalized): Secondary | ICD-10-CM | POA: Diagnosis not present

## 2023-08-19 DIAGNOSIS — I10 Essential (primary) hypertension: Secondary | ICD-10-CM | POA: Diagnosis not present

## 2023-08-19 DIAGNOSIS — G629 Polyneuropathy, unspecified: Secondary | ICD-10-CM | POA: Diagnosis not present

## 2023-08-19 DIAGNOSIS — M48062 Spinal stenosis, lumbar region with neurogenic claudication: Secondary | ICD-10-CM | POA: Diagnosis not present

## 2023-08-19 DIAGNOSIS — R262 Difficulty in walking, not elsewhere classified: Secondary | ICD-10-CM | POA: Diagnosis not present

## 2023-08-23 DIAGNOSIS — N390 Urinary tract infection, site not specified: Secondary | ICD-10-CM | POA: Diagnosis not present

## 2023-08-23 DIAGNOSIS — I5032 Chronic diastolic (congestive) heart failure: Secondary | ICD-10-CM | POA: Diagnosis not present

## 2023-08-23 DIAGNOSIS — K5900 Constipation, unspecified: Secondary | ICD-10-CM | POA: Diagnosis not present

## 2023-08-24 DIAGNOSIS — M48062 Spinal stenosis, lumbar region with neurogenic claudication: Secondary | ICD-10-CM | POA: Diagnosis not present

## 2023-08-24 DIAGNOSIS — I10 Essential (primary) hypertension: Secondary | ICD-10-CM | POA: Diagnosis not present

## 2023-08-24 DIAGNOSIS — G629 Polyneuropathy, unspecified: Secondary | ICD-10-CM | POA: Diagnosis not present

## 2023-08-24 DIAGNOSIS — R262 Difficulty in walking, not elsewhere classified: Secondary | ICD-10-CM | POA: Diagnosis not present

## 2023-08-24 DIAGNOSIS — M6281 Muscle weakness (generalized): Secondary | ICD-10-CM | POA: Diagnosis not present

## 2023-08-25 DIAGNOSIS — I10 Essential (primary) hypertension: Secondary | ICD-10-CM | POA: Diagnosis not present

## 2023-08-25 DIAGNOSIS — I6350 Cerebral infarction due to unspecified occlusion or stenosis of unspecified cerebral artery: Secondary | ICD-10-CM | POA: Diagnosis not present

## 2023-09-06 DIAGNOSIS — K5901 Slow transit constipation: Secondary | ICD-10-CM | POA: Diagnosis not present

## 2023-09-06 DIAGNOSIS — G894 Chronic pain syndrome: Secondary | ICD-10-CM | POA: Diagnosis not present

## 2023-09-20 DIAGNOSIS — R300 Dysuria: Secondary | ICD-10-CM | POA: Diagnosis not present

## 2023-09-27 DIAGNOSIS — K219 Gastro-esophageal reflux disease without esophagitis: Secondary | ICD-10-CM | POA: Diagnosis not present

## 2023-09-27 DIAGNOSIS — I6350 Cerebral infarction due to unspecified occlusion or stenosis of unspecified cerebral artery: Secondary | ICD-10-CM | POA: Diagnosis not present

## 2023-09-27 DIAGNOSIS — I119 Hypertensive heart disease without heart failure: Secondary | ICD-10-CM | POA: Diagnosis not present

## 2023-09-27 DIAGNOSIS — E782 Mixed hyperlipidemia: Secondary | ICD-10-CM | POA: Diagnosis not present

## 2023-10-04 DIAGNOSIS — K5900 Constipation, unspecified: Secondary | ICD-10-CM | POA: Diagnosis not present

## 2023-10-04 DIAGNOSIS — G894 Chronic pain syndrome: Secondary | ICD-10-CM | POA: Diagnosis not present

## 2023-10-18 DIAGNOSIS — I5032 Chronic diastolic (congestive) heart failure: Secondary | ICD-10-CM | POA: Diagnosis not present

## 2023-10-18 DIAGNOSIS — E559 Vitamin D deficiency, unspecified: Secondary | ICD-10-CM | POA: Diagnosis not present

## 2023-10-18 DIAGNOSIS — K5900 Constipation, unspecified: Secondary | ICD-10-CM | POA: Diagnosis not present

## 2023-10-20 DIAGNOSIS — I119 Hypertensive heart disease without heart failure: Secondary | ICD-10-CM | POA: Diagnosis not present

## 2023-10-20 DIAGNOSIS — I6350 Cerebral infarction due to unspecified occlusion or stenosis of unspecified cerebral artery: Secondary | ICD-10-CM | POA: Diagnosis not present

## 2023-11-01 DIAGNOSIS — G894 Chronic pain syndrome: Secondary | ICD-10-CM | POA: Diagnosis not present

## 2023-11-04 DIAGNOSIS — D0461 Carcinoma in situ of skin of right upper limb, including shoulder: Secondary | ICD-10-CM | POA: Diagnosis not present

## 2023-11-04 DIAGNOSIS — C44329 Squamous cell carcinoma of skin of other parts of face: Secondary | ICD-10-CM | POA: Diagnosis not present

## 2023-11-15 DIAGNOSIS — K219 Gastro-esophageal reflux disease without esophagitis: Secondary | ICD-10-CM | POA: Diagnosis not present

## 2023-11-15 DIAGNOSIS — I110 Hypertensive heart disease with heart failure: Secondary | ICD-10-CM | POA: Diagnosis not present

## 2023-11-15 DIAGNOSIS — E782 Mixed hyperlipidemia: Secondary | ICD-10-CM | POA: Diagnosis not present

## 2023-11-16 DIAGNOSIS — H04123 Dry eye syndrome of bilateral lacrimal glands: Secondary | ICD-10-CM | POA: Diagnosis not present

## 2023-11-26 DIAGNOSIS — E119 Type 2 diabetes mellitus without complications: Secondary | ICD-10-CM | POA: Diagnosis not present

## 2023-11-26 DIAGNOSIS — E559 Vitamin D deficiency, unspecified: Secondary | ICD-10-CM | POA: Diagnosis not present

## 2023-11-26 DIAGNOSIS — I10 Essential (primary) hypertension: Secondary | ICD-10-CM | POA: Diagnosis not present

## 2023-11-26 DIAGNOSIS — E039 Hypothyroidism, unspecified: Secondary | ICD-10-CM | POA: Diagnosis not present

## 2023-11-26 DIAGNOSIS — E782 Mixed hyperlipidemia: Secondary | ICD-10-CM | POA: Diagnosis not present

## 2023-11-29 DIAGNOSIS — I6350 Cerebral infarction due to unspecified occlusion or stenosis of unspecified cerebral artery: Secondary | ICD-10-CM | POA: Diagnosis not present

## 2023-11-29 DIAGNOSIS — I119 Hypertensive heart disease without heart failure: Secondary | ICD-10-CM | POA: Diagnosis not present

## 2023-11-29 DIAGNOSIS — G894 Chronic pain syndrome: Secondary | ICD-10-CM | POA: Diagnosis not present
